# Patient Record
Sex: Male | Born: 1955 | Race: White | Hispanic: No | Marital: Married | Smoking: Never smoker
Health system: Southern US, Community
[De-identification: ages and names within clinical notes are randomized; demographics above are authoritative.]

## PROBLEM LIST (undated history)

## (undated) DIAGNOSIS — C801 Malignant (primary) neoplasm, unspecified: Secondary | ICD-10-CM

## (undated) DIAGNOSIS — R12 Heartburn: Secondary | ICD-10-CM

## (undated) DIAGNOSIS — I10 Essential (primary) hypertension: Secondary | ICD-10-CM

## (undated) HISTORY — PX: COLONOSCOPY: SHX174

## (undated) MED FILL — Dexamethasone Sodium Phosphate Inj 100 MG/10ML: INTRAMUSCULAR | Qty: 1 | Status: AC

---
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
## 2003-04-28 HISTORY — PX: KNEE ARTHROSCOPY: SUR90

## 2004-07-24 ENCOUNTER — Ambulatory Visit: Payer: Self-pay | Admitting: Orthopaedic Surgery

## 2004-10-13 ENCOUNTER — Ambulatory Visit: Payer: Self-pay | Admitting: Orthopaedic Surgery

## 2010-07-25 ENCOUNTER — Ambulatory Visit: Payer: Self-pay

## 2013-09-26 DIAGNOSIS — I10 Essential (primary) hypertension: Secondary | ICD-10-CM | POA: Diagnosis present

## 2017-01-13 ENCOUNTER — Other Ambulatory Visit: Payer: Self-pay | Admitting: Internal Medicine

## 2017-01-13 DIAGNOSIS — E782 Mixed hyperlipidemia: Secondary | ICD-10-CM

## 2017-01-20 ENCOUNTER — Ambulatory Visit
Admission: RE | Admit: 2017-01-20 | Discharge: 2017-01-20 | Disposition: A | Discharge status: Home / Self Care | Payer: Self-pay | Source: Ambulatory Visit | Attending: Internal Medicine | Admitting: Internal Medicine

## 2017-01-20 DIAGNOSIS — E782 Mixed hyperlipidemia: Secondary | ICD-10-CM | POA: Insufficient documentation

## 2017-01-20 DIAGNOSIS — E041 Nontoxic single thyroid nodule: Secondary | ICD-10-CM | POA: Insufficient documentation

## 2017-01-20 DIAGNOSIS — I6523 Occlusion and stenosis of bilateral carotid arteries: Secondary | ICD-10-CM | POA: Insufficient documentation

## 2019-12-01 ENCOUNTER — Other Ambulatory Visit: Payer: Self-pay | Admitting: Internal Medicine

## 2019-12-01 DIAGNOSIS — G8929 Other chronic pain: Secondary | ICD-10-CM

## 2019-12-15 ENCOUNTER — Other Ambulatory Visit: Payer: Self-pay

## 2019-12-15 ENCOUNTER — Ambulatory Visit
Admission: RE | Admit: 2019-12-15 | Discharge: 2019-12-15 | Disposition: A | Discharge status: Home / Self Care | Payer: Medicare Other | Source: Ambulatory Visit | Attending: Internal Medicine | Admitting: Internal Medicine

## 2019-12-15 DIAGNOSIS — G8929 Other chronic pain: Secondary | ICD-10-CM | POA: Insufficient documentation

## 2019-12-15 DIAGNOSIS — R109 Unspecified abdominal pain: Secondary | ICD-10-CM | POA: Diagnosis present

## 2019-12-15 HISTORY — DX: Essential (primary) hypertension: I10

## 2019-12-15 MED ORDER — IOHEXOL 300 MG/ML  SOLN
100.0000 mL | Freq: Once | INTRAMUSCULAR | Status: AC | PRN
  Administered 2019-12-15: 100 mL via INTRAVENOUS

## 2022-02-28 IMAGING — CT CT ABD-PEL WO/W CM
2 of 9 series · 12 of 46 positions shown, 19 images · IV contrast (omnipaque)
Comparison: None.

CLINICAL DATA: Right flank pain for 6 weeks.

EXAM:
CT ABDOMEN AND PELVIS WITHOUT AND WITH CONTRAST
TECHNIQUE: Multidetector CT imaging of the abdomen and pelvis was performed
following the standard protocol before and following the bolus
administration of intravenous contrast.
CONTRAST:  100mL OMNIPAQUE IOHEXOL 300 MG/ML  SOLN

[Series 2: abd pelvis pre 5.00 · axial · non-contrast · 0.92mm/px · z∈[-1495,-1135]mm · 9 of 91 slices shown, 15 images]
[im 10/91  soft-tissue]
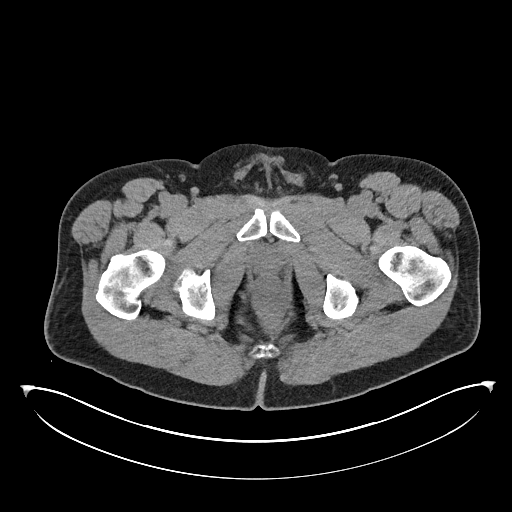
[im 10/91  bone]
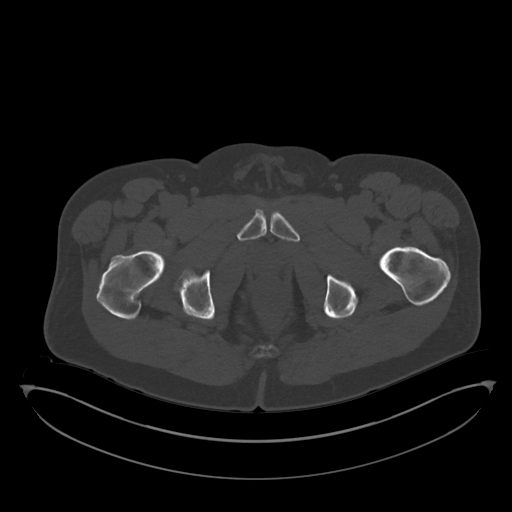
[im 19/91  soft-tissue]
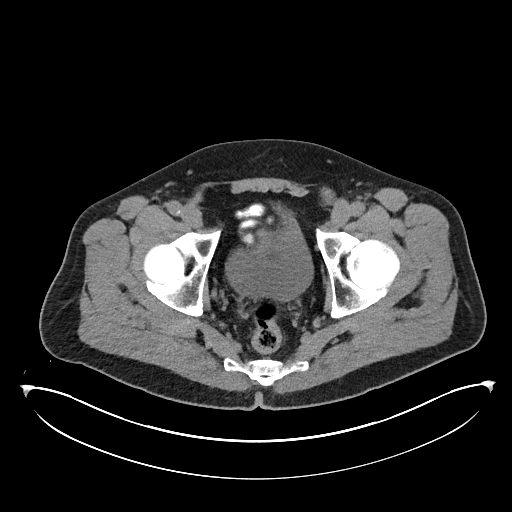
[im 28/91  soft-tissue]
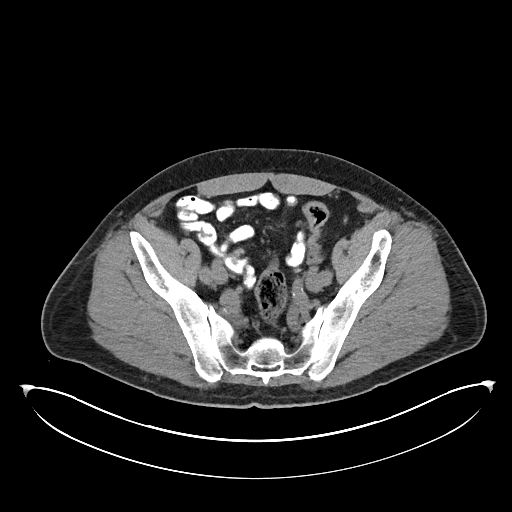
[im 37/91  soft-tissue]
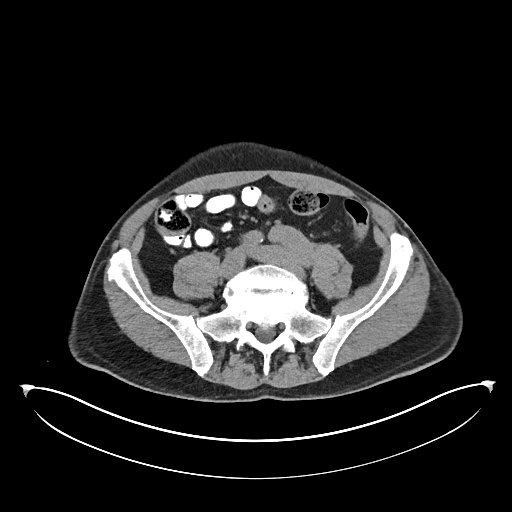
[im 46/91  soft-tissue]
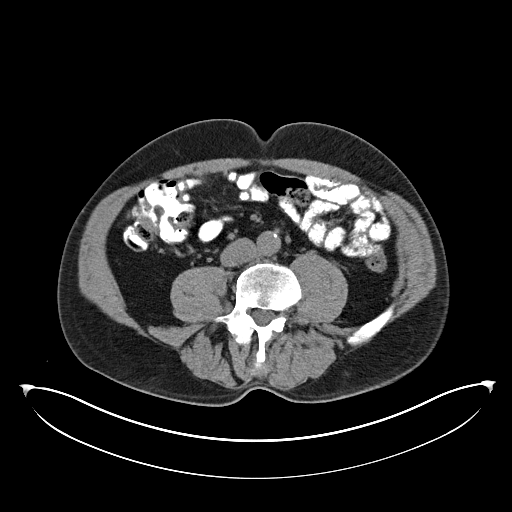
[im 55/91  soft-tissue]
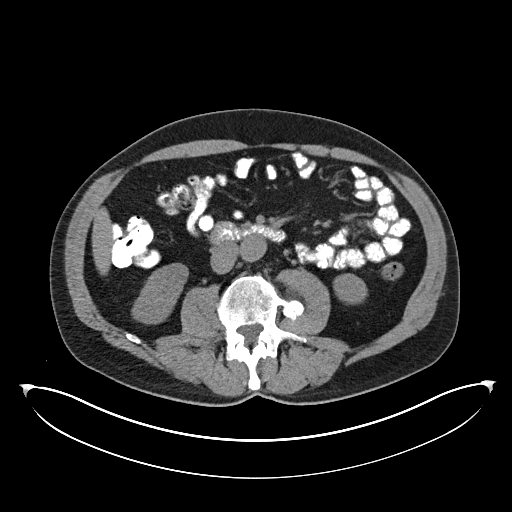
[im 55/91  lung]
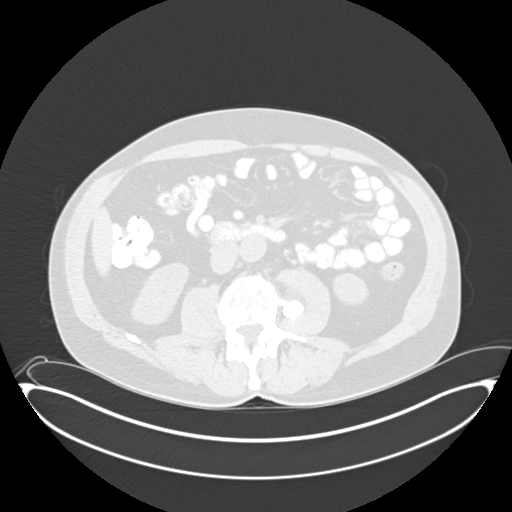
[im 64/91  soft-tissue]
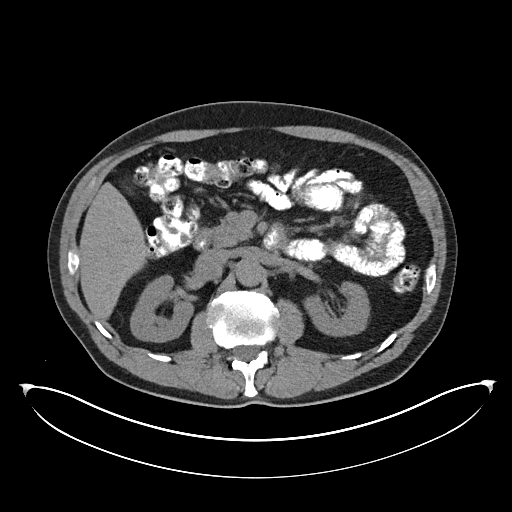
[im 64/91  lung]
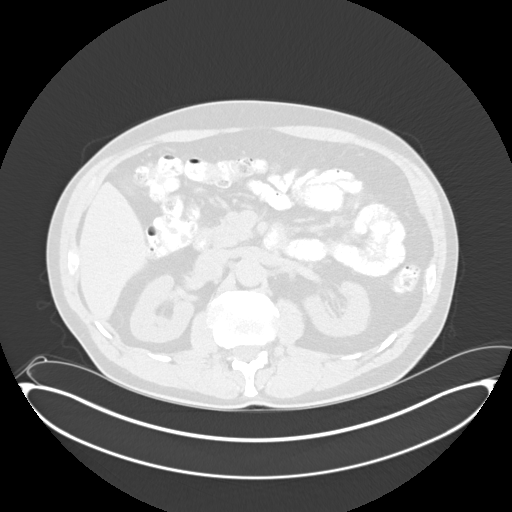
[im 73/91  soft-tissue]
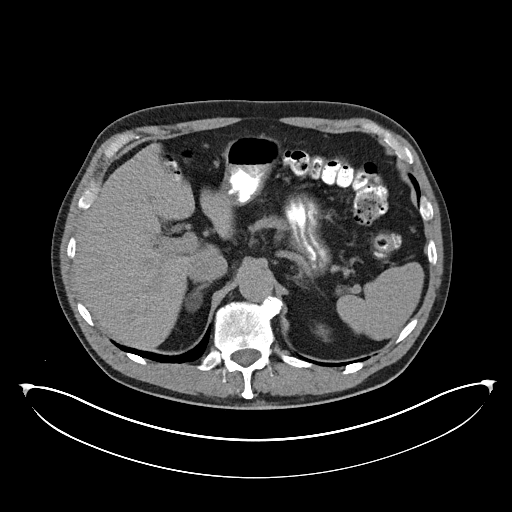
[im 73/91  lung]
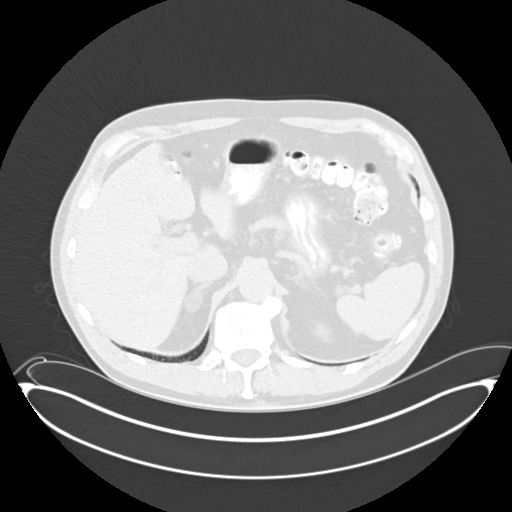
[im 82/91  soft-tissue]
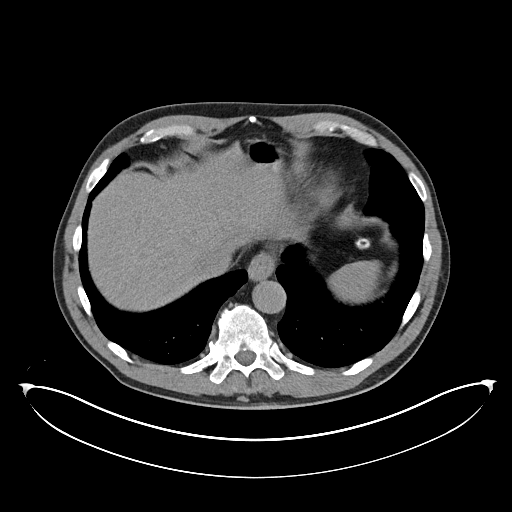
[im 82/91  lung]
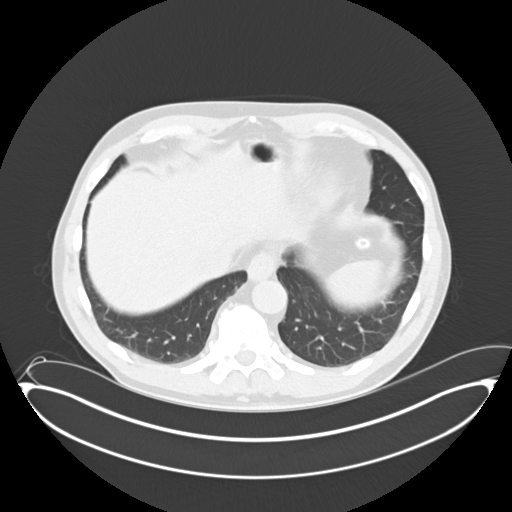
[im 82/91  bone]
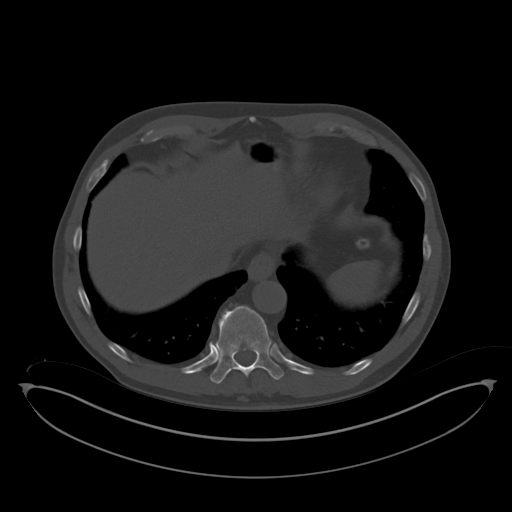

[Series 4: abd pelvis pre 2.00 cor · coronal · non-contrast · 0.82mm/px · 3 of 144 slices shown, 4 images]
[im 36/144  soft-tissue]
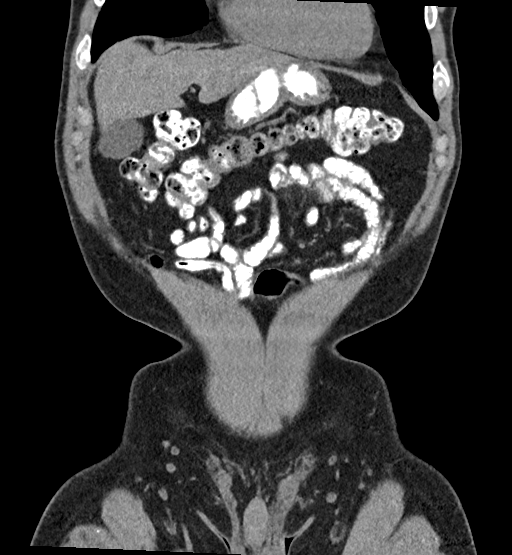
[im 72/144  soft-tissue]
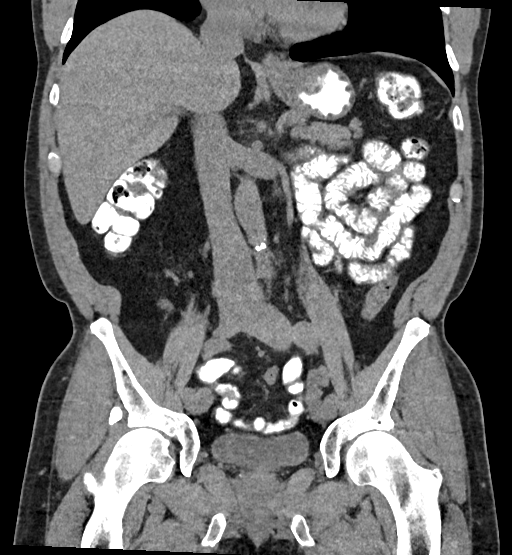
[im 72/144  bone]
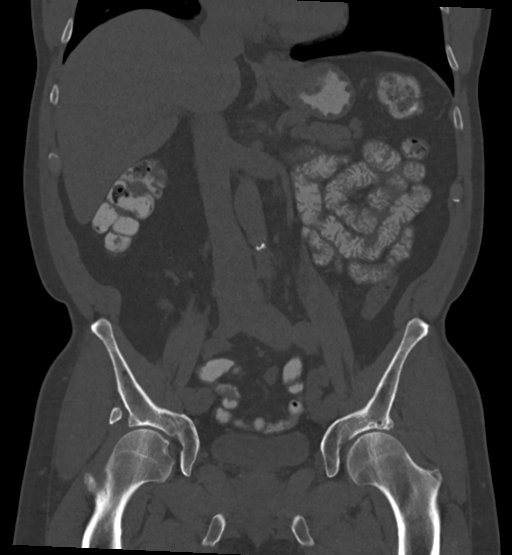
[im 108/144  soft-tissue]
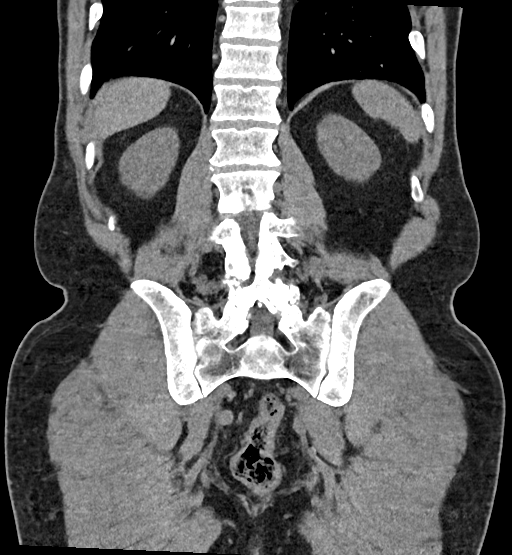

[12 of 46 positions shown; findings below may reference images not displayed]

FINDINGS: Lower chest: Clear lung bases. Borderline cardiomegaly. Tiny hiatal
hernia.

Hepatobiliary: Normal liver. Normal gallbladder, without biliary
ductal dilatation.

Pancreas: Normal, without mass or ductal dilatation.

Spleen: Normal in size, without focal abnormality.

Adrenals/Urinary Tract: Normal left adrenal gland. Right adrenal
nodule of 1.6 cm is low-density, consistent with an adenoma on [DATE].

No renal calculi or hydronephrosis. No hydroureter or ureteric
calculi. No bladder calculi.

No renal mass on post-contrast imaging. Normal post-contrast
appearance of the urinary bladder.

Stomach/Bowel: Normal remainder of the stomach. Normal colon,
appendix, and terminal ileum. Normal small bowel.

Vascular/Lymphatic: Aortic atherosclerosis. No abdominopelvic
adenopathy.

Reproductive: Normal prostate.

Other: No significant free fluid.

Musculoskeletal: Scattered pelvic sclerotic lesions are likely bone
islands. Bilateral L5 pars defects with grade 1 L5-S1
anterolisthesis. Lumbosacral spondylosis. Minimal convex left lumbar
spine curvature.
IMPRESSION: 1.  No acute process or explanation for right-sided pain.
2. Right adrenal adenoma.
3.  Aortic Atherosclerosis ([id number]).

## 2022-04-14 ENCOUNTER — Other Ambulatory Visit: Payer: Self-pay | Admitting: Internal Medicine

## 2022-04-14 DIAGNOSIS — J189 Pneumonia, unspecified organism: Secondary | ICD-10-CM

## 2022-04-27 DIAGNOSIS — R918 Other nonspecific abnormal finding of lung field: Secondary | ICD-10-CM

## 2022-04-27 DIAGNOSIS — J910 Malignant pleural effusion: Secondary | ICD-10-CM

## 2022-04-27 HISTORY — DX: Malignant pleural effusion: J91.0

## 2022-04-27 HISTORY — DX: Other nonspecific abnormal finding of lung field: R91.8

## 2022-04-30 ENCOUNTER — Ambulatory Visit
Admission: RE | Admit: 2022-04-30 | Discharge: 2022-04-30 | Disposition: A | Discharge status: Home / Self Care | Payer: Medicare Other | Source: Ambulatory Visit | Attending: Internal Medicine | Admitting: Internal Medicine

## 2022-04-30 DIAGNOSIS — J189 Pneumonia, unspecified organism: Secondary | ICD-10-CM | POA: Diagnosis not present

## 2022-04-30 MED ORDER — IOHEXOL 300 MG/ML  SOLN
75.0000 mL | Freq: Once | INTRAMUSCULAR | Status: AC | PRN
  Administered 2022-04-30: 75 mL via INTRAVENOUS

## 2022-05-04 ENCOUNTER — Other Ambulatory Visit: Payer: Self-pay | Admitting: Pulmonary Disease

## 2022-05-04 DIAGNOSIS — J90 Pleural effusion, not elsewhere classified: Secondary | ICD-10-CM

## 2022-05-05 ENCOUNTER — Other Ambulatory Visit: Payer: Self-pay | Admitting: Internal Medicine

## 2022-05-05 ENCOUNTER — Ambulatory Visit
Admission: RE | Admit: 2022-05-05 | Discharge: 2022-05-05 | Disposition: A | Discharge status: Home / Self Care | Payer: Medicare Other | Source: Ambulatory Visit | Attending: Pulmonary Disease | Admitting: Pulmonary Disease

## 2022-05-05 ENCOUNTER — Ambulatory Visit
Admission: RE | Admit: 2022-05-05 | Discharge: 2022-05-05 | Disposition: A | Discharge status: Home / Self Care | Payer: Medicare Other | Source: Ambulatory Visit | Attending: Internal Medicine | Admitting: Internal Medicine

## 2022-05-05 DIAGNOSIS — J90 Pleural effusion, not elsewhere classified: Secondary | ICD-10-CM

## 2022-05-05 LAB — LACTATE DEHYDROGENASE, PLEURAL OR PERITONEAL FLUID: LD, Fluid: 239 U/L — ABNORMAL HIGH (ref 3–23)

## 2022-05-05 LAB — BODY FLUID CELL COUNT WITH DIFFERENTIAL
Eos, Fluid: 2 %
Lymphs, Fluid: 77 %
Monocyte-Macrophage-Serous Fluid: 19 %
Neutrophil Count, Fluid: 2 %
Total Nucleated Cell Count, Fluid: 2284 cu mm

## 2022-05-05 LAB — GLUCOSE, PLEURAL OR PERITONEAL FLUID: Glucose, Fluid: 80 mg/dL

## 2022-05-05 MED ORDER — LIDOCAINE HCL (PF) 1 % IJ SOLN
10.0000 mL | Freq: Once | INTRAMUSCULAR | Status: AC
  Administered 2022-05-05: 10 mL via INTRADERMAL
  Filled 2022-05-05: qty 10

## 2022-05-05 NOTE — Procedures (Signed)
PROCEDURE SUMMARY:  Successful US guided diagnostic and therapeutic right thoracentesis. Yielded 1.55 L of clear, amber fluid. Pt tolerated procedure well. No immediate complications.  Specimen was sent for labs. CXR ordered.  EBL < 1 mL  Tyson Alias, AGNP 05/05/2022 9:10 AM

## 2022-05-05 NOTE — Discharge Instructions (Signed)
Discharge instructions reviewed with patient.

## 2022-05-07 LAB — CYTOLOGY - NON PAP

## 2022-05-12 ENCOUNTER — Encounter: Payer: Self-pay | Admitting: *Deleted

## 2022-05-12 DIAGNOSIS — J910 Malignant pleural effusion: Secondary | ICD-10-CM

## 2022-05-12 DIAGNOSIS — R918 Other nonspecific abnormal finding of lung field: Secondary | ICD-10-CM

## 2022-05-12 NOTE — Addendum Note (Signed)
Addended by: Glory Buff on: 05/12/2022 11:42 AM   Modules accepted: Orders

## 2022-05-12 NOTE — Progress Notes (Signed)
Referral received from Dr. Karna Christmas for suspicious malignancy in pleural fluid. Pt preferred to come in at next available new pt appt. Pt scheduled for 05/14/22 at 930am with Dr. Cathie Hoops. Pt and wife confirmed appt. Reviewed visitor and masking policy. Pt and wife did not have any questions during call. Instructed to call with any questions or needs prior to appt. Pt and wife verbalized understanding. Nothing further needed at this time.

## 2022-05-13 ENCOUNTER — Ambulatory Visit
Admission: RE | Admit: 2022-05-13 | Discharge: 2022-05-13 | Disposition: A | Discharge status: Home / Self Care | Payer: Medicare Other | Source: Ambulatory Visit | Attending: Oncology | Admitting: Oncology

## 2022-05-13 ENCOUNTER — Telehealth: Payer: Self-pay

## 2022-05-13 VITALS — Ht 70.0 in | Wt 199.0 lb

## 2022-05-13 DIAGNOSIS — R918 Other nonspecific abnormal finding of lung field: Secondary | ICD-10-CM | POA: Diagnosis not present

## 2022-05-13 DIAGNOSIS — J910 Malignant pleural effusion: Secondary | ICD-10-CM | POA: Diagnosis present

## 2022-05-13 DIAGNOSIS — M47816 Spondylosis without myelopathy or radiculopathy, lumbar region: Secondary | ICD-10-CM | POA: Insufficient documentation

## 2022-05-13 DIAGNOSIS — M4317 Spondylolisthesis, lumbosacral region: Secondary | ICD-10-CM | POA: Diagnosis not present

## 2022-05-13 DIAGNOSIS — I700 Atherosclerosis of aorta: Secondary | ICD-10-CM | POA: Diagnosis not present

## 2022-05-13 DIAGNOSIS — D3501 Benign neoplasm of right adrenal gland: Secondary | ICD-10-CM | POA: Insufficient documentation

## 2022-05-13 LAB — GLUCOSE, CAPILLARY: Glucose-Capillary: 96 mg/dL (ref 70–99)

## 2022-05-13 MED ORDER — FLUDEOXYGLUCOSE F - 18 (FDG) INJECTION
11.1200 | Freq: Once | INTRAVENOUS | Status: AC | PRN
  Administered 2022-05-13: 11.12 via INTRAVENOUS

## 2022-05-13 NOTE — Telephone Encounter (Signed)
Introductory phone call made to patient prior to his first visit with Dr. Cathie Hoops on 05/14/22. Patient did not answer and I was sent to his voicemail. I introduced myself on voicemail and told patient that I was calling to answer any questions that he may have about appointment and make sure he knows where to come. I told patient that he could call back if he would like but if not then we will see him tomorrow at his appointment.

## 2022-05-14 ENCOUNTER — Inpatient Hospital Stay: Payer: Medicare Other

## 2022-05-14 ENCOUNTER — Encounter: Payer: Self-pay | Admitting: *Deleted

## 2022-05-14 ENCOUNTER — Inpatient Hospital Stay: Payer: Medicare Other | Attending: Oncology | Admitting: Oncology

## 2022-05-14 ENCOUNTER — Encounter: Payer: Self-pay | Admitting: Oncology

## 2022-05-14 VITALS — BP 157/97 | HR 65 | Temp 97.0°F | Wt 200.3 lb

## 2022-05-14 DIAGNOSIS — R911 Solitary pulmonary nodule: Secondary | ICD-10-CM | POA: Diagnosis present

## 2022-05-14 DIAGNOSIS — C3490 Malignant neoplasm of unspecified part of unspecified bronchus or lung: Secondary | ICD-10-CM | POA: Insufficient documentation

## 2022-05-14 DIAGNOSIS — Z79899 Other long term (current) drug therapy: Secondary | ICD-10-CM | POA: Diagnosis not present

## 2022-05-14 DIAGNOSIS — J90 Pleural effusion, not elsewhere classified: Secondary | ICD-10-CM | POA: Insufficient documentation

## 2022-05-14 DIAGNOSIS — R918 Other nonspecific abnormal finding of lung field: Secondary | ICD-10-CM | POA: Insufficient documentation

## 2022-05-14 DIAGNOSIS — I10 Essential (primary) hypertension: Secondary | ICD-10-CM | POA: Diagnosis not present

## 2022-05-14 DIAGNOSIS — J910 Malignant pleural effusion: Secondary | ICD-10-CM | POA: Insufficient documentation

## 2022-05-14 LAB — COMPREHENSIVE METABOLIC PANEL
ALT: 15 U/L (ref 0–44)
AST: 19 U/L (ref 15–41)
Albumin: 3.6 g/dL (ref 3.5–5.0)
Alkaline Phosphatase: 81 U/L (ref 38–126)
Anion gap: 8 (ref 5–15)
BUN: 15 mg/dL (ref 8–23)
CO2: 26 mmol/L (ref 22–32)
Calcium: 8.6 mg/dL — ABNORMAL LOW (ref 8.9–10.3)
Chloride: 101 mmol/L (ref 98–111)
Creatinine, Ser: 1.06 mg/dL (ref 0.61–1.24)
GFR, Estimated: >60 mL/min (ref >60)
Glucose, Bld: 102 mg/dL — ABNORMAL HIGH (ref 70–99)
Potassium: 5.1 mmol/L (ref 3.5–5.1)
Sodium: 135 mmol/L (ref 135–145)
Total Bilirubin: 0.8 mg/dL (ref 0.3–1.2)
Total Protein: 7.6 g/dL (ref 6.5–8.1)

## 2022-05-14 LAB — CBC WITH DIFFERENTIAL/PLATELET
Abs Immature Granulocytes: 0.06 10*3/uL (ref 0.00–0.07)
Basophils Absolute: 0.1 10*3/uL (ref 0.0–0.1)
Basophils Relative: 1 %
Eosinophils Absolute: 0.4 10*3/uL (ref 0.0–0.5)
Eosinophils Relative: 6 %
HCT: 45.3 % (ref 39.0–52.0)
Hemoglobin: 15.2 g/dL (ref 13.0–17.0)
Immature Granulocytes: 1 %
Lymphocytes Relative: 15 %
Lymphs Abs: 1.1 10*3/uL (ref 0.7–4.0)
MCH: 31 pg (ref 26.0–34.0)
MCHC: 33.6 g/dL (ref 30.0–36.0)
MCV: 92.4 fL (ref 80.0–100.0)
Monocytes Absolute: 0.8 10*3/uL (ref 0.1–1.0)
Monocytes Relative: 11 %
Neutro Abs: 4.8 10*3/uL (ref 1.7–7.7)
Neutrophils Relative %: 66 %
Platelets: 295 10*3/uL (ref 150–400)
RBC: 4.9 MIL/uL (ref 4.22–5.81)
RDW: 12.4 % (ref 11.5–15.5)
WBC: 7.4 10*3/uL (ref 4.0–10.5)
nRBC: 0 % (ref 0.0–0.2)

## 2022-05-14 LAB — MISC LABCORP TEST (SEND OUT): Labcorp test code: 9985

## 2022-05-14 NOTE — Progress Notes (Signed)
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

## 2022-05-14 NOTE — Progress Notes (Signed)
Met with patient during initial consult with Dr. Cathie Hoops to discuss suspicious findings in pleural fluid. All questions answered during visit. Pt requesting educational materials regarding lung cancer and possible treatment options. Informed pt that will gather materials and schedule his appts while he is getting labwork drawn. Uanble to find pt after lab draw to give materials and appt info. Called pt and left message regarding brain MRI appt and that materials he requested will be mailed to him. Instructed pt to call back if has any questions or needs.

## 2022-05-14 NOTE — Assessment & Plan Note (Addendum)
Imaging results and pleural effusion cytology were reviewed and discussed with patient.  Clinical picture is high suspicious for malignancy, ddx includes primary lung cancer, lymphoma etc.  Given that cytology is not definitive, I recommend further tissue biopsy via bronchoscopy. -We discussed with pulmonology Dr. Karna Christmas and he will arrange. Recommend MRI brain w wo contrast to complete staging.  Plan to send NGS molecular studies. Check CBC and CMP

## 2022-05-14 NOTE — Assessment & Plan Note (Signed)
Likely malignant pleural effusion.  Status post diagnostic thoracentesis.  Cytology not definitive. He is currently not symptomatic.  No benefit for therapeutic thoracentesis.  Awaiting above workup.

## 2022-05-14 NOTE — Progress Notes (Signed)
Hematology/Oncology Consult Note [phone] [phone]     REFERRING PROVIDER: Danella Penton, MD   Patient Care Team: Danella Penton, MD as PCP - General (Internal Medicine) Glory Buff, RN as Oncology Nurse Navigator  CHIEF COMPLAINTS/PURPOSE OF CONSULTATION:  Lung mass, malignant pleural effusion.    ASSESSMENT & PLAN:   Lung mass Imaging results and pleural effusion cytology were reviewed and discussed with patient.  Clinical picture is high suspicious for malignancy, ddx includes primary lung cancer, lymphoma etc.  Given that cytology is not definitive, I recommend further tissue biopsy via bronchoscopy. -We discussed with pulmonology Dr. Karna Christmas and he will arrange. Recommend MRI brain w wo contrast to complete staging.  Plan to send NGS molecular studies. 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  Status post diagnostic thoracentesis.  Cytology not definitive. He is currently not symptomatic.  No benefit for therapeutic thoracentesis.  Awaiting above workup.   Orders Placed This Encounter  Procedures   MR Brain W Wo Contrast    Standing Status:   Future    Standing Expiration Date:   05/14/2023    Order Specific Question:   If indicated for the ordered procedure, I authorize the administration of contrast media per Radiology protocol    Answer:   Yes    Order Specific Question:   What is the patient's sedation requirement?    Answer:   No Sedation    Order Specific Question:   Does the patient have a pacemaker or implanted devices?    Answer:   No    Order Specific Question:   Use SRS Protocol?    Answer:   No    Order Specific Question:   Preferred imaging location?    Answer:   [facility] (table limit - 550lbs)   CBC with Differential/Platelet    Standing Status:   Future    Number of Occurrences:   1    Standing Expiration Date:   05/15/2023   Comprehensive metabolic panel     Standing Status:   Future    Number of Occurrences:   1    Standing Expiration Date:   05/14/2023   Follow-up TBD All questions were answered. The patient knows to call the clinic with any problems, questions or concerns.  Rickard Patience, MD, PhD [facility] Hematology Oncology 05/14/2022   HISTORY OF PRESENTING ILLNESS:  Kurt Ewing 67 y.o. male presents to establish care for lung mass and malignant pleural effusion.  Patient is a lifelong never smoker.  He has has a persistent cough since summer of 2023.  02/25/22 patient was seen by PCP and was diagnosed with bronchitis, was treated with Azithromycin.  Cough did not improve. He took levaquin for 10 days without improvement either.  04/30/22 CT chest w contrast showed  1. Perihilar right upper lobe masslike opacity measuring 3.4 x 5.6 x 3.8 cm, This abuts the major fissure 2. Moderate-sized right pleural effusion which is partially loculated tracks anteriorly the lower hemithorax. Possible occasional pleural enhancement and thickening posteriorly. 3. Rounded masslike opacity in the dependent right lower lobe measuring 5.8 x 4.1 cm 4. Additional bilateral pulmonary nodules, largest measuring 7 mm in the right upper lobe. 5. Prominent mediastinal and right hilar lymph nodes.  6   Low-density 2.4 cm right adrenal nodule. This is unchanged from 12/15/2019 abdominal CT in typical of adenoma  05/05/2022 status post ultrasound-guided right thoracentesis.  Cytology showed suspicious for malignancy, background  lymphocyte predominant effusion.  05/13/2022 PET scan initial staging showed 1. 5.9 by 3.5 cm right hilar mass with maximum SUV 5.7, compatible with malignancy. The epicenter of this mass appears to be in the right upper lobe. Faintly increased activity in the right hilar lymph node which could be involved. 2. Large right pleural effusion with pleural thickening and subtle accentuated activity along the pleural surface without a well-defined  masslike pleural lesion. Correlate with recent thoracocentesis results. 3. Low-grade activity in the collapsed right lower lobe, maximum SUV 3.2, probably inflammatory. 4. No findings of malignancy outside of the chest. 5. 2.4 cm right adrenal adenoma does not require follow up. 6. 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 Lumbar spondylosis and degenerative disc disease. 7. Aortic atherosclerosis.  Patient was referred to hematology oncology for further evaluation.  Patient is accompanied by wife today.  He denies being shortness of breath.  He remains very active.  He denies unintentional weight loss, night sweats, fever.    MEDICAL HISTORY:  Past Medical History:  Diagnosis Date   Hypertension     SURGICAL HISTORY: History reviewed. No pertinent surgical history.  SOCIAL HISTORY: Social History   Socioeconomic History   Marital status: Married    Spouse name: Not on file   Number of children: Not on file   Years of education: Not on file   Highest education level: Not on file  Occupational History   Not on file  Tobacco Use   Smoking status: Never   Smokeless tobacco: Never  Vaping Use   Vaping Use: Never used  Substance and Sexual Activity   Alcohol use: Not on file   Drug use: Not on file   Sexual activity: Not on file  Other Topics Concern   Not on file  Social History Narrative   Not on file   Social Determinants of Health   Financial Resource Strain: Not on file  Food Insecurity: Not on file  Transportation Needs: Not on file  Physical Activity: Not on file  Stress: Not on file  Social Connections: Not on file  Intimate Partner Violence: Not on file    FAMILY HISTORY: Family History  Problem Relation Age of Onset   Hypertension Father    Heart disease Father    Hypertension Brother    COPD Brother     ALLERGIES:  has no allergies on file.  MEDICATIONS:  Current Outpatient Medications  Medication Sig  Dispense Refill   famotidine (PEPCID) 40 MG tablet Take 1 tablet by mouth at bedtime.     ibuprofen (ADVIL) 200 MG tablet Take by mouth.     lisinopril (ZESTRIL) 5 MG tablet Take 5 mg by mouth daily.     meloxicam (MOBIC) 15 MG tablet Take by mouth.     No current facility-administered medications for this visit.    Review of Systems  Constitutional:  Negative for appetite change, chills, fatigue, fever and unexpected weight change.  HENT:   Negative for hearing loss and voice change.   Eyes:  Negative for eye problems and icterus.  Respiratory:  Positive for cough. Negative for chest tightness and shortness of breath.   Cardiovascular:  Negative for chest pain and leg swelling.  Gastrointestinal:  Negative for abdominal distention and abdominal pain.  Endocrine: Negative for hot flashes.  Genitourinary:  Negative for difficulty urinating, dysuria and frequency.   Musculoskeletal:  Negative for arthralgias.  Skin:  Negative for itching and rash.  Neurological:  Negative for light-headedness and numbness.  Hematological:  Negative for adenopathy. Does not bruise/bleed easily.  Psychiatric/Behavioral:  Negative for confusion.      PHYSICAL EXAMINATION: ECOG PERFORMANCE STATUS: 0 - Asymptomatic  Vitals:   05/14/22 0941  BP: (!) 157/97  Pulse: 65  Temp: (!) 97 F (36.1 C)  SpO2: 99%   Filed Weights   05/14/22 0941  Weight: 200 lb 4.8 oz (90.9 kg)    Physical Exam Constitutional:      General: He is not in acute distress.    Appearance: He is not diaphoretic.  HENT:     Head: Normocephalic and atraumatic.     Nose: Nose normal.     Mouth/Throat:     Pharynx: No oropharyngeal exudate.  Eyes:     General: No scleral icterus. Cardiovascular:     Rate and Rhythm: Normal rate.     Heart sounds: No murmur heard. Pulmonary:     Effort: Pulmonary effort is normal. No respiratory distress.     Comments: Decreased right side breath sound.  Abdominal:     General: Bowel  sounds are normal. There is no distension.     Palpations: Abdomen is soft.     Tenderness: There is no abdominal tenderness.  Musculoskeletal:        General: Normal range of motion.     Cervical back: Normal range of motion and neck supple.  Skin:    General: Skin is warm and dry.     Findings: No erythema.  Neurological:     Mental Status: He is alert and oriented to person, place, and time.     Cranial Nerves: No cranial nerve deficit.     Motor: No abnormal muscle tone.     Coordination: Coordination normal.  Psychiatric:        Mood and Affect: Affect normal.      LABORATORY DATA:  I have reviewed the data as listed    Latest Ref Rng & Units 05/14/2022   10:51 AM  CBC  WBC 4.0 - 10.5 K/uL 7.4   Hemoglobin 13.0 - 17.0 g/dL 89.0   Hematocrit 13.8 - 52.0 % 45.3   Platelets 150 - 400 K/uL 295       Latest Ref Rng & Units 05/14/2022   10:51 AM  CMP  Glucose 70 - 99 mg/dL 552   BUN 8 - 23 mg/dL 15   Creatinine 8.94 - 1.24 mg/dL 2.40   Sodium 642 - 942 mmol/L 135   Potassium 3.5 - 5.1 mmol/L 5.1   Chloride 98 - 111 mmol/L 101   CO2 22 - 32 mmol/L 26   Calcium 8.9 - 10.3 mg/dL 8.6   Total Protein 6.5 - 8.1 g/dL 7.6   Total Bilirubin 0.3 - 1.2 mg/dL 0.8   Alkaline Phos 38 - 126 U/L 81   AST 15 - 41 U/L 19   ALT 0 - 44 U/L 15      RADIOGRAPHIC STUDIES: I have personally reviewed the radiological images as listed and agreed with the findings in the report. NM PET Image Initial (PI) Skull Base To Thigh  Result Date: 05/13/2022 CLINICAL DATA:  Subsequent treatment strategy for right upper lobe mass with pleural effusion. EXAM: NUCLEAR MEDICINE PET SKULL BASE TO THIGH TECHNIQUE: 11.1 mCi F-18 FDG was injected intravenously. Full-ring PET imaging was performed from the skull base to thigh after the radiotracer. CT data was obtained and used for attenuation correction and anatomic localization. Fasting blood  glucose: 96 mg/dl COMPARISON:  CT chest 04/30/2022 FINDINGS:  Mediastinal blood pool activity: SUV max 1.5 Liver activity: SUV max NA NECK: Anterior tongue activity, no CT abnormality, considered physiologic. Incidental CT findings: None CHEST: Right hilar mass extends along the fissures but its epicenter seems to be in the right upper lobe, the lesion measures 5.9 by 3.5 cm on image 89 series 2 with maximum SUV 5.7, compatible with malignancy. Large right pleural effusion with right pleural thickening and subtle accentuated activity along the pleural surface without a well-defined masslike pleural lesion. Pleural fluid activity about 0.8, correlate with recent thoracocentesis results in assessing for pleural malignancy. There is some ill-defined low-grade activity in the collapsed right lower lobe, maximum SUV 3.2. Given the ill definition I favor this is probably being inflammatory rather than tumor, but the right lower lobe is difficult to characterize due to the complete atelectasis. Right hilar node measuring 0.7 cm in short axis on image 91 series 2 has a maximum SUV of 2.5, although low this is greater than background blood pool activity. 1.4 cm hypodense lesion of the right thyroid lobe is not substantially metabolic. Not clinically significant; no follow-up imaging recommended (ref: J Am Coll Radiol. 2015 Feb;12(2): 143-50). Incidental CT findings: Coronary, aortic arch, and branch vessel atherosclerotic vascular disease. Mild cardiomegaly. ABDOMEN/PELVIS: No significant abnormal hypermetabolic activity in this region. Incidental CT findings: 2.4 by 1.9 cm right adrenal adenoma. This does not require follow up. Atherosclerosis is present, including aortoiliac atherosclerotic disease. SKELETON: No significant abnormal hypermetabolic activity in this region. 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 Lumbar spondylosis and degenerative disc disease. IMPRESSION: 1. 5.9 by 3.5 cm right hilar mass with maximum SUV  5.7, compatible with malignancy. The epicenter of this mass appears to be in the right upper lobe. Faintly increased activity in the right hilar lymph node which could be involved. 2. Large right pleural effusion with pleural thickening and subtle accentuated activity along the pleural surface without a well-defined masslike pleural lesion. Correlate with recent thoracocentesis results. 3. Low-grade activity in the collapsed right lower lobe, maximum SUV 3.2, probably inflammatory. 4. No findings of malignancy outside of the chest. 5. 2.4 cm right adrenal adenoma does not require follow up. 6. 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 Lumbar spondylosis and degenerative disc disease. 7. Aortic atherosclerosis. Aortic Atherosclerosis (ICD10-I70.0). Electronically Signed   By: Van Clines M.D.   On: 05/13/2022 15:14   DG Chest Port 1 View  Result Date: 05/05/2022 CLINICAL DATA:  67 year old male with right pleural effusion and suspected underlying lung mass by CT. Status post ultrasound-guided right thoracentesis this morning. EXAM: PORTABLE CHEST 1 VIEW COMPARISON:  Chest CT 04/30/2022. FINDINGS: Portable AP upright view at 0913 hours. Reduced volume of right pleural fluid with on going rounded and masslike roughly 5 cm mid lung opacity corresponding to the CT abnormality. No pneumothorax. Ongoing volume loss in the right hemithorax. Stable mediastinal contours. Left lung and trachea appear negative. Stable visualized osseous structures. IMPRESSION: 1. Decreased right pleural effusion following thoracentesis with no pneumothorax. 2. Continued 5 cm right mid lung mass-like and suspicious lung opacity as seen by CT. Electronically Signed   By: Genevie Ann M.D.   On: 05/05/2022 09:27   US THORACENTESIS ASP PLEURAL SPACE W/IMG GUIDE  Result Date: 05/05/2022 INDICATION: Patient recently diagnosed with pneumonia had incidental finding on CT of right upper and  lower lobe mass,  multiple bilateral pulmonary nodules and moderate right-sided pleural effusion. Patient was referred to IR for diagnostic and therapeutic right thoracentesis. EXAM: ULTRASOUND GUIDED DIAGNOSTIC AND THERAPEUTIC RIGHT THORACENTESIS MEDICATIONS: 10 mL 1 % lidocaine COMPLICATIONS: None immediate. PROCEDURE: An ultrasound guided thoracentesis was thoroughly discussed with the patient and questions answered. The benefits, risks, alternatives and complications were also discussed. The patient understands and wishes to proceed with the procedure. Written consent was obtained. Ultrasound was performed to localize and mark an adequate pocket of fluid in the right chest. The area was then prepped and draped in the normal sterile fashion. 1% Lidocaine was used for local anesthesia. Under ultrasound guidance a 6 Fr Safe-T-Centesis catheter was introduced. Thoracentesis was performed. The catheter was removed and a dressing applied. FINDINGS: A total of approximately 1.55 L of clear, amber fluid was removed. Samples were sent to the laboratory as requested by the clinical team. IMPRESSION: Successful ultrasound guided RIGHT thoracentesis yielding 1.55 L of pleural fluid. Read by: Alex Gardener, AGNP-BC Electronically Signed   By: Roanna Banning M.D.   On: 05/05/2022 09:26   CT CHEST W CONTRAST  Result Date: 04/30/2022 CLINICAL DATA:  Pneumonia of right middle lobe due to infectious organism. EXAM: CT CHEST WITH CONTRAST TECHNIQUE: Multidetector CT imaging of the chest was performed during intravenous contrast administration. RADIATION DOSE REDUCTION: This exam was performed according to the departmental dose-optimization program which includes automated exposure control, adjustment of the mA and/or kV according to patient size and/or use of iterative reconstruction technique. CONTRAST:  45mL OMNIPAQUE IOHEXOL 300 MG/ML  SOLN COMPARISON:  Report from radiograph 04/06/2022, images not available. FINDINGS: Cardiovascular: Diffuse  calcified noncalcified atheromatous plaque throughout the thoracic aorta. No aneurysm. The heart is upper normal in size. No pericardial effusion. There are coronary artery calcifications. No central pulmonary embolus on this exam not tailored to pulmonary artery assessment. Mediastinum/Nodes: 9 mm lower anterior paratracheal node series 2, image 78. 12 mm right hilar node series 2, image 84. Additional prominent but not pathologically enlarged low right hilar node measures 7 mm series 2, image 99. There are small right epicardial nodes. There is no left hilar adenopathy. 14 mm hypodense right thyroid nodule. Not clinically significant; no follow-up imaging recommended (ref: J Am Coll Radiol. 2015 Feb;12(2): 143-50).Minimal hiatal hernia. Lungs/Pleura: Perihilar right upper lobe masslike opacity measures 3.4 x 5.6 x 3.8 cm, for example series 3, image 79. This abuts the major fissure. Margins are partially obscured by adjacent pleural fluid, however non obscured margins are spiculated and microlobulated. Moderate-sized right pleural effusion which is partially loculated tracks anteriorly the lower hemithorax. There may be areas of occasional pleural enhancement and thickening posteriorly. Within the dependent right lower lobe is an additional rounded masslike opacity, 5.8 x 4.1 cm, measured on series 2, image 117 there is this represents round atelectasis due to overlying pleural effusion. 7 mm right upper lobe pulmonary nodule series 3, image 46. Bandlike opacities in the right middle lobe. Few small nodules in the left lung includes 4 mm in the anterior left apex series 3, image 34, 2-3 mm left upper lobe series 3, image 56, 4 mm paramediastinal medial left upper lobe series 3, image 72, 5 mm central left upper lobe series 3, image 82, 4 mGy superior segment left upper lobe series 3, image 75. No left pleural effusion. No endobronchial lesion. Upper Abdomen: 2.4 cm right adrenal nodule with Hounsfield units of 35.  This nodule is unchanged from 12/15/2019 abdominal CT.  No left adrenal nodule. No acute upper abdominal findings. Musculoskeletal: No blastic or destructive lytic bone lesions. Mild thoracic spondylosis. Advanced right glenohumeral degenerative change with subchondral cysts. IMPRESSION: 1. Perihilar right upper lobe masslike opacity measuring 3.4 x 5.6 x 3.8 cm, suspicious for primary bronchogenic malignancy. This abuts the major fissure. Differential consideration includes masslike pneumonia. Recommend in neoplasm workup. 2. Moderate-sized right pleural effusion which is partially loculated tracks anteriorly the lower hemithorax. There may be areas of occasional pleural enhancement and thickening posteriorly. 3. Rounded masslike opacity in the dependent right lower lobe measuring 5.8 x 4.1 cm. This is nonspecific and may represent round atelectasis due to overlying pleural effusion, although the possibility of additional site of neoplasm or infection is also raise. 4. Additional bilateral pulmonary nodules, largest measuring 7 mm in the right upper lobe. 5. Prominent mediastinal and right hilar lymph nodes. 6. Low-density 2.4 cm right adrenal nodule. This is unchanged from 12/15/2019 abdominal CT in typical of adenoma. Aortic Atherosclerosis (ICD10-I70.0). These results will be called to the ordering clinician or representative by the Radiologist Assistant, and communication documented in the PACS or [employer]. Electronically Signed   By: Narda Rutherford M.D.   On: 04/30/2022 23:00

## 2022-05-15 ENCOUNTER — Ambulatory Visit: Payer: Medicare Other | Admitting: Oncology

## 2022-05-15 ENCOUNTER — Other Ambulatory Visit: Payer: Medicare Other

## 2022-05-20 ENCOUNTER — Other Ambulatory Visit: Payer: Self-pay | Admitting: Pulmonary Disease

## 2022-05-20 DIAGNOSIS — R918 Other nonspecific abnormal finding of lung field: Secondary | ICD-10-CM

## 2022-05-21 ENCOUNTER — Encounter
Admission: RE | Admit: 2022-05-21 | Discharge: 2022-05-21 | Disposition: A | Discharge status: Home / Self Care | Payer: Medicare Other | Source: Ambulatory Visit | Attending: Pulmonary Disease | Admitting: Pulmonary Disease

## 2022-05-21 ENCOUNTER — Ambulatory Visit
Admission: RE | Admit: 2022-05-21 | Discharge: 2022-05-21 | Disposition: A | Discharge status: Home / Self Care | Payer: Medicare Other | Source: Ambulatory Visit | Attending: Oncology | Admitting: Oncology

## 2022-05-21 ENCOUNTER — Other Ambulatory Visit: Payer: Self-pay | Admitting: Pulmonary Disease

## 2022-05-21 DIAGNOSIS — J910 Malignant pleural effusion: Secondary | ICD-10-CM

## 2022-05-21 DIAGNOSIS — R918 Other nonspecific abnormal finding of lung field: Secondary | ICD-10-CM

## 2022-05-21 HISTORY — DX: Heartburn: R12

## 2022-05-21 MED ORDER — GADOBUTROL 1 MMOL/ML IV SOLN
9.0000 mL | Freq: Once | INTRAVENOUS | Status: AC | PRN
  Administered 2022-05-21: 9 mL via INTRAVENOUS

## 2022-05-21 NOTE — Patient Instructions (Addendum)
Your procedure is scheduled on: Friday, February 2 Report to the Registration Desk on the 1st floor of the [facility]. To find out your arrival time, please call [phone] between 1PM - 3PM on: Thursday, February 1 If your arrival time is 6:00 am, do not arrive prior to that time as the [facility] entrance doors do not open until 6:00 am.  REMEMBER: Instructions that are not followed completely may result in serious medical risk, up to and including death; or upon the discretion of your surgeon and anesthesiologist your surgery may need to be rescheduled.  Do not eat or drink after midnight the night before surgery.  No gum chewing, lozengers or hard candies.  TAKE THESE MEDICATIONS THE MORNING OF SURGERY WITH A SIP OF WATER:  Famotidine (Pepcid)  One week prior to surgery: starting January 26 Stop Meloxicam and  Anti-inflammatories (NSAIDS) such as Advil, Aleve, Ibuprofen, Motrin, Naproxen, Naprosyn and Aspirin based products such as Excedrin, Goodys Powder, BC Powder. Stop ANY OVER THE COUNTER supplements until after surgery. You may however, continue to take Tylenol if needed for pain up until the day of surgery.  No Alcohol for 24 hours before or after surgery.  No Smoking including e-cigarettes for 24 hours prior to surgery.  No chewable tobacco products for at least 6 hours prior to surgery.  No nicotine patches on the day of surgery.  Do not use any "recreational" drugs for at least a week prior to your surgery.  Please be advised that the combination of cocaine and anesthesia may have negative outcomes, up to and including death. If you test positive for cocaine, your surgery will be cancelled.  On the morning of surgery brush your teeth with toothpaste and water, you may rinse your mouth with mouthwash if you wish. Do not swallow any toothpaste or mouthwash.  Do not wear jewelry, make-up, hairpins, clips or nail polish.  Do not wear lotions, powders, or perfumes.    Contact lenses, hearing aids and dentures may not be worn into surgery.  Do not bring valuables to the hospital. [facility] is not responsible for any missing/lost belongings or valuables.   Notify your doctor if there is any change in your medical condition (cold, fever, infection).  Wear comfortable clothing (specific to your surgery type) to the hospital.  If you are being discharged the day of surgery, you will not be allowed to drive home. You will need a responsible adult (18 years or older) to drive you home and stay with you that night.   If you are taking public transportation, you will need to have a responsible adult (18 years or older) with you. Please confirm with your physician that it is acceptable to use public transportation.   Please call the [place] Dept. at [phone] if you have any questions about these instructions.  Surgery Visitation Policy:  Patients undergoing a surgery or procedure may have two family members or support persons with them as long as the person is not COVID-19 positive or experiencing its symptoms.

## 2022-05-22 ENCOUNTER — Other Ambulatory Visit: Payer: Self-pay | Admitting: Pulmonary Disease

## 2022-05-22 ENCOUNTER — Ambulatory Visit
Admission: RE | Admit: 2022-05-22 | Discharge: 2022-05-22 | Disposition: A | Discharge status: Home / Self Care | Payer: Medicare Other | Source: Ambulatory Visit | Attending: Pulmonary Disease | Admitting: Pulmonary Disease

## 2022-05-22 ENCOUNTER — Ambulatory Visit
Admission: RE | Admit: 2022-05-22 | Discharge: 2022-05-22 | Disposition: A | Discharge status: Home / Self Care | Payer: Medicare Other | Source: Ambulatory Visit | Attending: Student | Admitting: Student

## 2022-05-22 VITALS — BP 135/85 | HR 57

## 2022-05-22 DIAGNOSIS — C3490 Malignant neoplasm of unspecified part of unspecified bronchus or lung: Secondary | ICD-10-CM | POA: Diagnosis not present

## 2022-05-22 DIAGNOSIS — J910 Malignant pleural effusion: Secondary | ICD-10-CM | POA: Diagnosis present

## 2022-05-22 DIAGNOSIS — R918 Other nonspecific abnormal finding of lung field: Secondary | ICD-10-CM | POA: Diagnosis not present

## 2022-05-22 LAB — BODY FLUID CELL COUNT WITH DIFFERENTIAL
Eos, Fluid: 2 %
Lymphs, Fluid: 94 %
Monocyte-Macrophage-Serous Fluid: 3 %
Neutrophil Count, Fluid: 0 %
Total Nucleated Cell Count, Fluid: 2421 cu mm

## 2022-05-22 LAB — GLUCOSE, PLEURAL OR PERITONEAL FLUID: Glucose, Fluid: 95 mg/dL

## 2022-05-22 LAB — LACTATE DEHYDROGENASE, PLEURAL OR PERITONEAL FLUID: LD, Fluid: 283 U/L — ABNORMAL HIGH (ref 3–23)

## 2022-05-22 LAB — PROTEIN, PLEURAL OR PERITONEAL FLUID: Total protein, fluid: 4.3 g/dL

## 2022-05-22 LAB — AMYLASE, PLEURAL OR PERITONEAL FLUID: Amylase, Fluid: 85 U/L

## 2022-05-22 MED ORDER — LIDOCAINE HCL (PF) 1 % IJ SOLN
10.0000 mL | Freq: Once | INTRAMUSCULAR | Status: AC
  Administered 2022-05-22: 10 mL via INTRADERMAL
  Filled 2022-05-22: qty 10

## 2022-05-22 NOTE — Procedures (Signed)
PROCEDURE SUMMARY:  Successful US guided right thoracentesis. Yielded 1.3 L of amber-colored fluid. Pt tolerated procedure well. No immediate complications.  Specimen sent for labs. CXR ordered; no post-procedure pneumothorax identified   EBL < 2 mL  Theresa Duty, NP 05/22/2022 9:39 AM

## 2022-05-23 LAB — ACID FAST SMEAR (AFB, MYCOBACTERIA): Acid Fast Smear: NEGATIVE

## 2022-05-25 ENCOUNTER — Encounter: Payer: Self-pay | Admitting: *Deleted

## 2022-05-25 LAB — CYTOLOGY - NON PAP

## 2022-05-25 NOTE — Progress Notes (Signed)
Per Dr. Tasia Catchings, pt needs referral to radiation oncology for multiple brain lesions seen on brain MRI. Pt scheduled with Dr. Baruch Gouty on 2/8 at 11am after pt is scheduled to follow up with Dr. Tasia Catchings. Phone call made to patient to review results. Informed of appt with Dr. Baruch Gouty and pt requested to move his appt sooner if possible. Informed pt that will see if Dr. Baruch Gouty has any other openings sooner and will call him back with new appt date/time. All questions answered during call. Instructed to call back with any questions or needs. Pt verbalized understanding.

## 2022-05-26 ENCOUNTER — Encounter: Payer: Self-pay | Admitting: Radiation Oncology

## 2022-05-26 ENCOUNTER — Ambulatory Visit
Admission: RE | Admit: 2022-05-26 | Discharge: 2022-05-26 | Disposition: A | Discharge status: Home / Self Care | Payer: Medicare Other | Source: Ambulatory Visit | Attending: Radiation Oncology | Admitting: Radiation Oncology

## 2022-05-26 ENCOUNTER — Encounter: Payer: Self-pay | Admitting: *Deleted

## 2022-05-26 ENCOUNTER — Ambulatory Visit
Admission: RE | Admit: 2022-05-26 | Discharge: 2022-05-26 | Disposition: A | Discharge status: Home / Self Care | Payer: Medicare Other | Source: Ambulatory Visit | Attending: Pulmonary Disease | Admitting: Pulmonary Disease

## 2022-05-26 VITALS — BP 124/84 | HR 76 | Temp 98.3°F | Resp 16 | Ht 70.0 in | Wt 197.0 lb

## 2022-05-26 DIAGNOSIS — R918 Other nonspecific abnormal finding of lung field: Secondary | ICD-10-CM | POA: Insufficient documentation

## 2022-05-26 DIAGNOSIS — C7931 Secondary malignant neoplasm of brain: Secondary | ICD-10-CM | POA: Insufficient documentation

## 2022-05-26 DIAGNOSIS — I10 Essential (primary) hypertension: Secondary | ICD-10-CM | POA: Diagnosis not present

## 2022-05-26 DIAGNOSIS — C3411 Malignant neoplasm of upper lobe, right bronchus or lung: Secondary | ICD-10-CM | POA: Insufficient documentation

## 2022-05-26 DIAGNOSIS — Z79899 Other long term (current) drug therapy: Secondary | ICD-10-CM | POA: Insufficient documentation

## 2022-05-26 DIAGNOSIS — J90 Pleural effusion, not elsewhere classified: Secondary | ICD-10-CM | POA: Diagnosis not present

## 2022-05-26 LAB — BODY FLUID CULTURE W GRAM STAIN
Culture: NO GROWTH
Gram Stain: NONE SEEN

## 2022-05-26 MED ORDER — DEXAMETHASONE 2 MG PO TABS: 2.0000 mg | ORAL_TABLET | Freq: Two times a day (BID) | ORAL | 2 refills | Status: DC

## 2022-05-26 NOTE — Consult Note (Signed)
NEW PATIENT EVALUATION  Name: Kurt Ewing  MRN: [id number]  Date:   05/26/2022     DOB: 10/06/1955   This 67 y.o. 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  REFERRING PHYSICIAN: Rusty Aus, MD  CHIEF COMPLAINT:  Chief Complaint  Patient presents with   Brain Mets    DIAGNOSIS: The primary encounter diagnosis was Lung mass. A diagnosis of Malignant neoplasm metastatic to brain [facility]) was also pertinent to this visit.   PREVIOUS INVESTIGATIONS: CT scans PET CT scans MRI of brain reviewed Clinical notes reviewed Cytology reports reviewed  HPI: Patient is a 67 year old male who presented with increasing cough right-sided chest pain and discomfort.  Was found to have a large right pleural effusion and right lung mass.  Initial CT scan showed a right upper lobe 5.6 x 3.8 cm mass consistent with primary bronchogenic carcinoma.  This abutted the major fissure.  He also had a moderate size right pleural effusion partially loculated.  He also had some round like mass opacity in dependent right lower lobe measuring 5.8 cm most likely atelectatic cyst.  He underwent thoracentesis although was nondiagnostic.  PET scan confirmed a 5.9 x 3.5 cm right hilar mass hypermetabolic with the epicenter apparently in the right upper lobe.  Also large pleural effusion.  No findings of malignancy outside the chest.  Brain MRI confirmed at least 13 lesions consistent with metastatic disease.  He is asymptomatic and in neurologic sense specifically denies any change in visual fields any focal neurologic deficits.  He is scheduled for bronchoscopy later this week.  Seen today for evaluation of his brain metastasis.  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    PAST  SURGICAL HISTORY:  Past Surgical History:  Procedure Laterality Date   COLONOSCOPY     KNEE ARTHROSCOPY Right 2005   TONSILLECTOMY  1970    FAMILY HISTORY: family history includes COPD in his brother; Heart disease in his father; Hypertension in his brother and father.  SOCIAL HISTORY:  reports that he has never smoked. He has never used smokeless tobacco. He reports current alcohol use. He reports that he does not currently use drugs.  ALLERGIES: Patient has no known allergies.  MEDICATIONS:  Current Outpatient Medications  Medication Sig Dispense Refill   dexamethasone (DECADRON) 2 MG tablet Take 1 tablet (2 mg total) by mouth 2 (two) times daily. 60 tablet 2   famotidine (PEPCID) 40 MG tablet Take 1 tablet by mouth at bedtime.     ibuprofen (ADVIL) 200 MG tablet Take 400 mg by mouth every 8 (eight) hours as needed.     lisinopril (ZESTRIL) 5 MG tablet Take 5 mg by mouth daily.     meloxicam (MOBIC) 15 MG tablet Take 15 mg by mouth daily as needed.     No current facility-administered medications for this encounter.    ECOG PERFORMANCE STATUS:  0 - Asymptomatic  REVIEW OF SYSTEMS: Patient denies any weight loss, fatigue, weakness, fever, chills or night sweats. Patient denies any loss of vision, blurred vision. Patient denies any ringing  of the ears or hearing loss. No irregular heartbeat. Patient denies heart murmur or history of fainting. Patient denies any chest pain or pain radiating to her upper extremities. Patient denies any shortness of breath, difficulty breathing at  night, cough or hemoptysis. Patient denies any swelling in the lower legs. Patient denies any nausea vomiting, vomiting of blood, or coffee ground material in the vomitus. Patient denies any stomach pain. Patient states has had normal bowel movements no significant constipation or diarrhea. Patient denies any dysuria, hematuria or significant nocturia. Patient denies any problems walking, swelling in the joints  or loss of balance. Patient denies any skin changes, loss of hair or loss of weight. Patient denies any excessive worrying or anxiety or significant depression. Patient denies any problems with insomnia. Patient denies excessive thirst, polyuria, polydipsia. Patient denies any swollen glands, patient denies easy bruising or easy bleeding. Patient denies any recent infections, allergies or URI. Patient "s visual fields have not changed significantly in recent time.   PHYSICAL EXAM: BP 124/84 (BP Location: Right Arm, Patient Position: Sitting, Cuff Size: Normal)   Pulse 76   Temp 98.3 F (36.8 C) (Tympanic)   Resp 16   Ht 5\' 10"  (1.778 m) Comment: stated ht  Wt 197 lb (89.4 kg)   BMI 28.27 kg/m  Well-developed well-nourished patient in NAD. HEENT reveals PERLA, EOMI, discs not visualized.  Oral cavity is clear. No oral mucosal lesions are identified. Neck is clear without evidence of cervical or supraclavicular adenopathy. Lungs are clear to A&P. Cardiac examination is essentially unremarkable with regular rate and rhythm without murmur rub or thrill. Abdomen is benign with no organomegaly or masses noted. Motor sensory and DTR levels are equal and symmetric in the upper and lower extremities. Cranial nerves II through XII are grossly intact. Proprioception is intact. No peripheral adenopathy or edema is identified. No motor or sensory levels are noted. Crude visual fields are within normal range.  LABORATORY DATA: Labs and cytology reports reviewed compatible with above-stated findings    RADIOLOGY RESULTS: MRI scan CT scans and PET scan all reviewed compatible with above-stated findings   IMPRESSION: Age for primary bronchogenic lung carcinoma with brain metastasis and 67 year old male  PLAN: At this time elect to go ahead with whole brain radiation.  I will plan on delivering 30 Gray in 10 fractions.  Risks and benefits of treatment occluding hair loss skin reaction fatigue alteration of blood  counts all were reviewed in detail with the patient.  I also will consider some radiation therapy to his chest along with concurrent chemotherapy to try to help alleviate some of his pleural effusion.  Will discuss that prior with Dr. Tasia Catchings and make further recommendations after we have established histologic diagnosis of his lung cancer.  Patient wife both comprehend the treatment plan well.  I personally set up and ordered CT simulation for tomorrow.  I would like to take this opportunity to thank you for allowing me to participate in the care of your patient.Noreene Filbert, MD

## 2022-05-26 NOTE — Progress Notes (Signed)
Met with patient during initial consult with Dr. Baruch Gouty to discuss recent brain MRI results and treatment options with radiation. All questions answered during visit. Reviewed upcoming appts. Instructed to call with any questions or needs. Pt verbalized understanding.

## 2022-05-27 ENCOUNTER — Ambulatory Visit
Admission: RE | Admit: 2022-05-27 | Discharge: 2022-05-27 | Disposition: A | Discharge status: Home / Self Care | Payer: Medicare Other | Source: Ambulatory Visit | Attending: Radiation Oncology | Admitting: Radiation Oncology

## 2022-05-27 ENCOUNTER — Telehealth: Payer: Self-pay | Admitting: *Deleted

## 2022-05-27 ENCOUNTER — Encounter
Admission: RE | Admit: 2022-05-27 | Discharge: 2022-05-27 | Disposition: A | Discharge status: Home / Self Care | Payer: Medicare Other | Source: Ambulatory Visit | Attending: Pulmonary Disease | Admitting: Pulmonary Disease

## 2022-05-27 DIAGNOSIS — I10 Essential (primary) hypertension: Secondary | ICD-10-CM | POA: Diagnosis not present

## 2022-05-27 DIAGNOSIS — Z1152 Encounter for screening for COVID-19: Secondary | ICD-10-CM | POA: Insufficient documentation

## 2022-05-27 DIAGNOSIS — Z8679 Personal history of other diseases of the circulatory system: Secondary | ICD-10-CM

## 2022-05-27 DIAGNOSIS — Z01818 Encounter for other preprocedural examination: Secondary | ICD-10-CM | POA: Insufficient documentation

## 2022-05-27 DIAGNOSIS — C7931 Secondary malignant neoplasm of brain: Secondary | ICD-10-CM | POA: Diagnosis present

## 2022-05-27 DIAGNOSIS — R0602 Shortness of breath: Secondary | ICD-10-CM

## 2022-05-27 DIAGNOSIS — C3491 Malignant neoplasm of unspecified part of right bronchus or lung: Secondary | ICD-10-CM | POA: Insufficient documentation

## 2022-05-27 DIAGNOSIS — Z01812 Encounter for preprocedural laboratory examination: Secondary | ICD-10-CM

## 2022-05-27 LAB — CHOLESTEROL, BODY FLUID: Cholesterol, Fluid: 97 mg/dL

## 2022-05-27 LAB — PROTIME-INR
INR: 1 (ref 0.8–1.2)
Prothrombin Time: 13.4 seconds (ref 11.4–15.2)

## 2022-05-27 LAB — APTT: aPTT: 33 seconds (ref 24–36)

## 2022-05-27 MED ORDER — DEXAMETHASONE 2 MG PO TABS: 2.0000 mg | ORAL_TABLET | Freq: Two times a day (BID) | ORAL | 2 refills | Status: DC

## 2022-05-27 NOTE — Telephone Encounter (Signed)
THe steroids that was to be ordered for patient was not received by pharmacy. I see that it was entered as a no print. Prescription resubmitted and confirmation of receipt received by pharmacy obtained

## 2022-05-28 ENCOUNTER — Other Ambulatory Visit: Payer: Medicare Other

## 2022-05-28 DIAGNOSIS — C3491 Malignant neoplasm of unspecified part of right bronchus or lung: Secondary | ICD-10-CM | POA: Insufficient documentation

## 2022-05-28 LAB — SARS CORONAVIRUS 2 (TAT 6-24 HRS): SARS Coronavirus 2: NEGATIVE

## 2022-05-29 ENCOUNTER — Ambulatory Visit: Payer: Medicare Other | Admitting: Certified Registered"

## 2022-05-29 ENCOUNTER — Encounter
Admission: RE | Disposition: A | Discharge status: Home / Self Care | Payer: Self-pay | Source: Home / Self Care | Attending: Pulmonary Disease

## 2022-05-29 ENCOUNTER — Other Ambulatory Visit: Payer: Self-pay | Admitting: *Deleted

## 2022-05-29 ENCOUNTER — Ambulatory Visit: Payer: Medicare Other

## 2022-05-29 ENCOUNTER — Ambulatory Visit
Admission: RE | Admit: 2022-05-29 | Discharge: 2022-05-29 | Disposition: A | Discharge status: Home / Self Care | Payer: Medicare Other | Attending: Pulmonary Disease | Admitting: Pulmonary Disease

## 2022-05-29 ENCOUNTER — Encounter: Payer: Self-pay | Admitting: *Deleted

## 2022-05-29 ENCOUNTER — Other Ambulatory Visit: Payer: Self-pay

## 2022-05-29 ENCOUNTER — Ambulatory Visit: Payer: Medicare Other | Admitting: Urgent Care

## 2022-05-29 DIAGNOSIS — J910 Malignant pleural effusion: Secondary | ICD-10-CM | POA: Insufficient documentation

## 2022-05-29 DIAGNOSIS — C771 Secondary and unspecified malignant neoplasm of intrathoracic lymph nodes: Secondary | ICD-10-CM | POA: Diagnosis not present

## 2022-05-29 DIAGNOSIS — I10 Essential (primary) hypertension: Secondary | ICD-10-CM | POA: Diagnosis not present

## 2022-05-29 DIAGNOSIS — R918 Other nonspecific abnormal finding of lung field: Secondary | ICD-10-CM | POA: Diagnosis present

## 2022-05-29 DIAGNOSIS — C3491 Malignant neoplasm of unspecified part of right bronchus or lung: Secondary | ICD-10-CM | POA: Insufficient documentation

## 2022-05-29 DIAGNOSIS — Z79899 Other long term (current) drug therapy: Secondary | ICD-10-CM | POA: Insufficient documentation

## 2022-05-29 DIAGNOSIS — Z5112 Encounter for antineoplastic immunotherapy: Secondary | ICD-10-CM | POA: Diagnosis present

## 2022-05-29 DIAGNOSIS — Z01812 Encounter for preprocedural laboratory examination: Secondary | ICD-10-CM

## 2022-05-29 DIAGNOSIS — R0602 Shortness of breath: Secondary | ICD-10-CM

## 2022-05-29 DIAGNOSIS — C3411 Malignant neoplasm of upper lobe, right bronchus or lung: Secondary | ICD-10-CM | POA: Insufficient documentation

## 2022-05-29 DIAGNOSIS — Z5111 Encounter for antineoplastic chemotherapy: Secondary | ICD-10-CM | POA: Insufficient documentation

## 2022-05-29 DIAGNOSIS — C7931 Secondary malignant neoplasm of brain: Secondary | ICD-10-CM

## 2022-05-29 DIAGNOSIS — Z8679 Personal history of other diseases of the circulatory system: Secondary | ICD-10-CM

## 2022-05-29 HISTORY — PX: VIDEO BRONCHOSCOPY WITH ENDOBRONCHIAL ULTRASOUND: SHX6177

## 2022-05-29 LAB — KOH PREP
KOH Prep: NONE SEEN
Special Requests: NORMAL

## 2022-05-29 LAB — POCT I-STAT, CHEM 8
BUN: 20 mg/dL (ref 8–23)
Calcium, Ion: 1.12 mmol/L — ABNORMAL LOW (ref 1.15–1.40)
Chloride: 105 mmol/L (ref 98–111)
Creatinine, Ser: 1.2 mg/dL (ref 0.61–1.24)
Glucose, Bld: 100 mg/dL — ABNORMAL HIGH (ref 70–99)
HCT: 43 % (ref 39.0–52.0)
Hemoglobin: 14.6 g/dL (ref 13.0–17.0)
Potassium: 4.6 mmol/L (ref 3.5–5.1)
Sodium: 140 mmol/L (ref 135–145)
TCO2: 26 mmol/L (ref 22–32)

## 2022-05-29 LAB — MISC LABCORP TEST (SEND OUT): Labcorp test code: 5367

## 2022-05-29 SURGERY — ROBOTIC ASSISTED NAVIGATIONAL BRONCHOSCOPY
Anesthesia: General

## 2022-05-29 MED ORDER — ONDANSETRON HCL 4 MG/2ML IJ SOLN
INTRAMUSCULAR | Status: DC | PRN
  Administered 2022-05-29: 4 mg via INTRAVENOUS

## 2022-05-29 MED ORDER — FENTANYL CITRATE (PF) 100 MCG/2ML IJ SOLN
INTRAMUSCULAR | Status: AC
  Filled 2022-05-29: qty 2

## 2022-05-29 MED ORDER — LIDOCAINE HCL URETHRAL/MUCOSAL 2 % EX GEL
1.0000 | Freq: Once | CUTANEOUS | Status: DC
  Filled 2022-05-29: qty 5

## 2022-05-29 MED ORDER — CHLORHEXIDINE GLUCONATE 0.12 % MT SOLN: 15.0000 mL | Freq: Once | OROMUCOSAL | Status: AC

## 2022-05-29 MED ORDER — PHENYLEPHRINE HCL-NACL 20-0.9 MG/250ML-% IV SOLN
INTRAVENOUS | Status: DC | PRN
  Administered 2022-05-29: 50 ug/min via INTRAVENOUS

## 2022-05-29 MED ORDER — LACTATED RINGERS IV SOLN: INTRAVENOUS | Status: DC

## 2022-05-29 MED ORDER — FENTANYL CITRATE (PF) 100 MCG/2ML IJ SOLN
INTRAMUSCULAR | Status: DC | PRN
  Administered 2022-05-29: 100 ug via INTRAVENOUS
  Administered 2022-05-29: 50 ug via INTRAVENOUS

## 2022-05-29 MED ORDER — LIDOCAINE HCL (CARDIAC) PF 100 MG/5ML IV SOSY
PREFILLED_SYRINGE | INTRAVENOUS | Status: DC | PRN
  Administered 2022-05-29 (×2): 100 mg via INTRAVENOUS

## 2022-05-29 MED ORDER — LACTATED RINGERS IV SOLN: INTRAVENOUS | Status: DC | PRN

## 2022-05-29 MED ORDER — PHENYLEPHRINE HCL (PRESSORS) 10 MG/ML IV SOLN
INTRAVENOUS | Status: DC | PRN
  Administered 2022-05-29: 160 ug via INTRAVENOUS
  Administered 2022-05-29: 80 ug via INTRAVENOUS
  Administered 2022-05-29: 160 ug via INTRAVENOUS
  Administered 2022-05-29: 80 ug via INTRAVENOUS

## 2022-05-29 MED ORDER — PROPOFOL 10 MG/ML IV BOLUS
INTRAVENOUS | Status: DC | PRN
  Administered 2022-05-29: 160 mg via INTRAVENOUS
  Administered 2022-05-29 (×2): 50 mg via INTRAVENOUS
  Administered 2022-05-29: 30 mg via INTRAVENOUS
  Administered 2022-05-29: 50 mg via INTRAVENOUS

## 2022-05-29 MED ORDER — PHENYLEPHRINE HCL-NACL 20-0.9 MG/250ML-% IV SOLN
INTRAVENOUS | Status: AC
  Filled 2022-05-29: qty 250

## 2022-05-29 MED ORDER — PHENYLEPHRINE HCL 0.25 % NA SOLN
1.0000 | Freq: Four times a day (QID) | NASAL | Status: DC | PRN
  Filled 2022-05-29: qty 15

## 2022-05-29 MED ORDER — FENTANYL CITRATE (PF) 100 MCG/2ML IJ SOLN: 25.0000 ug | INTRAMUSCULAR | Status: DC | PRN

## 2022-05-29 MED ORDER — PROPOFOL 10 MG/ML IV BOLUS
INTRAVENOUS | Status: AC
  Filled 2022-05-29: qty 20

## 2022-05-29 MED ORDER — OXYCODONE HCL 5 MG/5ML PO SOLN: 5.0000 mg | Freq: Once | ORAL | Status: DC | PRN

## 2022-05-29 MED ORDER — ACETAMINOPHEN 10 MG/ML IV SOLN: 1000.0000 mg | Freq: Once | INTRAVENOUS | Status: DC | PRN

## 2022-05-29 MED ORDER — SUGAMMADEX SODIUM 200 MG/2ML IV SOLN
INTRAVENOUS | Status: DC | PRN
  Administered 2022-05-29 (×2): 200 mg via INTRAVENOUS

## 2022-05-29 MED ORDER — MIDAZOLAM HCL 2 MG/2ML IJ SOLN
INTRAMUSCULAR | Status: DC | PRN
  Administered 2022-05-29: 2 mg via INTRAVENOUS

## 2022-05-29 MED ORDER — ESMOLOL HCL 100 MG/10ML IV SOLN
INTRAVENOUS | Status: DC | PRN
  Administered 2022-05-29: 20 mg via INTRAVENOUS
  Administered 2022-05-29 (×2): 30 mg via INTRAVENOUS

## 2022-05-29 MED ORDER — BUTAMBEN-TETRACAINE-BENZOCAINE 2-2-14 % EX AERO
1.0000 | INHALATION_SPRAY | Freq: Once | CUTANEOUS | Status: DC
  Filled 2022-05-29: qty 20

## 2022-05-29 MED ORDER — MIDAZOLAM HCL 2 MG/2ML IJ SOLN
INTRAMUSCULAR | Status: AC
  Filled 2022-05-29: qty 2

## 2022-05-29 MED ORDER — OXYCODONE HCL 5 MG PO TABS: 5.0000 mg | ORAL_TABLET | Freq: Once | ORAL | Status: DC | PRN

## 2022-05-29 MED ORDER — ORAL CARE MOUTH RINSE: 15.0000 mL | Freq: Once | OROMUCOSAL | Status: AC

## 2022-05-29 MED ORDER — ROCURONIUM BROMIDE 100 MG/10ML IV SOLN
INTRAVENOUS | Status: DC | PRN
  Administered 2022-05-29: 50 mg via INTRAVENOUS
  Administered 2022-05-29: 20 mg via INTRAVENOUS

## 2022-05-29 MED ORDER — LIDOCAINE HCL (PF) 1 % IJ SOLN
30.0000 mL | Freq: Once | INTRAMUSCULAR | Status: DC
  Filled 2022-05-29: qty 30

## 2022-05-29 MED ORDER — DEXAMETHASONE SODIUM PHOSPHATE 10 MG/ML IJ SOLN
INTRAMUSCULAR | Status: DC | PRN
  Administered 2022-05-29: 10 mg via INTRAVENOUS

## 2022-05-29 MED ORDER — ONDANSETRON HCL 4 MG/2ML IJ SOLN: 4.0000 mg | Freq: Once | INTRAMUSCULAR | Status: DC | PRN

## 2022-05-29 MED ORDER — CHLORHEXIDINE GLUCONATE 0.12 % MT SOLN
OROMUCOSAL | Status: AC
  Administered 2022-05-29: 15 mL via OROMUCOSAL
  Filled 2022-05-29: qty 15

## 2022-05-29 MED ORDER — VASOPRESSIN 20 UNIT/ML IV SOLN
INTRAVENOUS | Status: DC | PRN
  Administered 2022-05-29: 1 [IU] via INTRAVENOUS
  Administered 2022-05-29: 2 [IU] via INTRAVENOUS

## 2022-05-29 NOTE — H&P (Signed)
PULMONOLOGY         Date: 05/29/2022,   MRN# [id number] Kurt Ewing February 21, 1956     AdmissionWeight: 89.4 kg                 CurrentWeight: 89.4 kg  Referring provider: Dr Sabra Heck   CHIEF COMPLAINT:   Right lung mass with hilar lymphadenopathy   HISTORY OF PRESENT ILLNESS   This is a 67 year old male with a history of GERD, essential hypertension, patient send never smoker recently seen on outpatient with abnormal chest imaging.  Has surprisingly little symptoms and is able to perform well with ability to jog over a mile nonstop at this moment.  His CT shows a perihilar mass measuring approximately 6 x 5 cm with a satellite nodularity and linear segmental atelectasis or scarring.  There is additional areas of masslike focal consolidative opacity in the right middle and lower lobes without substantial change from the previous chest CT when we initially saw him.  There was associated pleural effusions which now has been drained x 2 via thoracentesis.  This did show abnormal atypical findings however to be inconclusive and patient requires additional biopsies for tissue diagnosis and staging.  Patient is here today for bronchoscopic evaluation with airway inspection, bronchoalveolar lavage, transbronchial and endobronchial biopsies and endobronchial ultrasound assisted lymph node biopsies.   PAST MEDICAL HISTORY   Past Medical History:  Diagnosis Date   Heart burn    Hypertension    Lung mass 04/2022   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     SURGICAL HISTORY   Past Surgical History:  Procedure Laterality Date   COLONOSCOPY     KNEE ARTHROSCOPY Right 2005   TONSILLECTOMY  1970     FAMILY HISTORY   Family History  Problem Relation Age of Onset   Hypertension Father    Heart disease Father    Hypertension Brother    COPD Brother      SOCIAL HISTORY   Social History   Tobacco Use   Smoking status: Never   Smokeless tobacco: Never  Vaping Use    Vaping Use: Never used  Substance Use Topics   Alcohol use: Yes    Comment: weekly   Drug use: Not Currently     MEDICATIONS    Home Medication:    Current Medication:  Current Facility-Administered Medications:    lactated ringers infusion, , Intravenous, Continuous, Piscitello, Precious Haws, MD, Last Rate: 10 mL/hr at 05/29/22 0641, New Bag at 05/29/22 0641    ALLERGIES   Patient has no known allergies.     REVIEW OF SYSTEMS    Review of Systems:  Gen:  Denies  fever, sweats, chills weigh loss  HEENT: Denies blurred vision, double vision, ear pain, eye pain, hearing loss, nose bleeds, sore throat Cardiac:  No dizziness, chest pain or heaviness, chest tightness,edema Resp:   reports minimal dyspnea Gi: Denies swallowing difficulty, stomach pain, nausea or vomiting, diarrhea, constipation, bowel incontinence Gu:  Denies bladder incontinence, burning urine Ext:   Denies Joint pain, stiffness or swelling Skin: Denies  skin rash, easy bruising or bleeding or hives Endoc:  Denies polyuria, polydipsia , polyphagia or weight change Psych:   Denies depression, insomnia or hallucinations   Other:  All other systems negative   VS: BP (!) 135/91   Pulse 76   Temp 98.8 F (37.1 C) (Temporal)   Resp 16   Ht 5\' 10"  (1.778 m)   Wt 89.4 kg  SpO2 95%   BMI 28.28 kg/m      PHYSICAL EXAM    GENERAL:NAD, no fevers, chills, no weakness no fatigue HEAD: Normocephalic, atraumatic.  EYES: Pupils equal, round, reactive to light. Extraocular muscles intact. No scleral icterus.  MOUTH: Moist mucosal membrane. Dentition intact. No abscess noted.  EAR, NOSE, THROAT: Clear without exudates. No external lesions.  NECK: Supple. No thyromegaly. No nodules. No JVD.  PULMONARY: Auscultation bilaterally CARDIOVASCULAR: S1 and S2. Regular rate and rhythm. No murmurs, rubs, or gallops. No edema. Pedal pulses 2+ bilaterally.  GASTROINTESTINAL: Soft, nontender, nondistended. No masses.  Positive bowel sounds. No hepatosplenomegaly.  MUSCULOSKELETAL: No swelling, clubbing, or edema. Range of motion full in all extremities.  NEUROLOGIC: Cranial nerves II through XII are intact. No gross focal neurological deficits. Sensation intact. Reflexes intact.  SKIN: No ulceration, lesions, rashes, or cyanosis. Skin warm and dry. Turgor intact.  PSYCHIATRIC: Mood, affect within normal limits. The patient is awake, alert and oriented x 3. Insight, judgment intact.       IMAGING    IMPRESSION: 1. Similar appearance of the right parahilar mass lesion with adjacent areas of satellite nodularity and linear segmental atelectasis/scarring. 2. Additional areas of masslike focal consolidative opacity in the right middle and lower lobes without substantial change since the recent CT chest with contrast. 3. No substantial change small to moderate loculated right pleural effusion. 4. Stable tiny left pulmonary nodules. 5. Stable 2 cm right adrenal adenoma. 6.  Aortic Atherosclerosis (ICD10-I70.0).     Electronically Signed   By: Misty Stanley M.D.   On: 05/27/2022 09:13  ASSESSMENT/PLAN   Right lung mass with hilar lymphadenopathy -Patient is here today for lung biopsy and lymph node biopsies this will be done utilizing [employer] robotic platform with body vision for confirmation and endobronchial ultrasound for lymph node biopsies.  Bronchoalveolar lavage and airway inspection was performed prior to this.     -Reviewed risks/complications and benefits with patient, risks include infection, pneumothorax/pneumomediastinum which may require chest tube placement as well as overnight/prolonged hospitalization and possible mechanical ventilation. Other risks include bleeding and very rarely death.  Patient understands risks and wishes to proceed.  Additional questions were answered, and patient is aware that post procedure patient will be going home with family and may experience cough with  possible clots on expectoration as well as phlegm which may last few days as well as hoarseness of voice post intubation and mechanical ventilation.            Thank you for allowing me to participate in the care of this patient.   Patient/Family are satisfied with care plan and all questions have been answered.    Provider disclosure: Patient with at least one acute or chronic illness or injury that poses a threat to life or bodily function and is being managed actively during this encounter.  All of the below services have been performed independently by signing provider:  review of prior documentation from internal and or external health records.  Review of previous and current lab results.  Interview and comprehensive assessment during patient visit today. Review of current and previous chest radiographs/CT scans. Discussion of management and test interpretation with health care team and patient/family.   This document was prepared using Dragon voice recognition software and may include unintentional dictation errors.     Ottie Glazier, M.D.  Division of Pulmonary & Critical Care Medicine

## 2022-05-29 NOTE — Anesthesia Procedure Notes (Signed)
Procedure Name: Intubation Date/Time: 05/29/2022 8:02 AM  Performed by: Patience Musca., CRNAPre-anesthesia Checklist: Patient identified, Patient being monitored, Timeout performed, Emergency Drugs available and Suction available Patient Re-evaluated:Patient Re-evaluated prior to induction Oxygen Delivery Method: Circle system utilized Preoxygenation: Pre-oxygenation with 100% oxygen Induction Type: IV induction Ventilation: Mask ventilation without difficulty Laryngoscope Size: McGraph and 4 Grade View: Grade I Tube type: Oral Tube size: 9.0 mm Number of attempts: 1 Airway Equipment and Method: Stylet Placement Confirmation: ETT inserted through vocal cords under direct vision, positive ETCO2 and breath sounds checked- equal and bilateral Secured at: 28 cm Tube secured with: Tape Dental Injury: Teeth and Oropharynx as per pre-operative assessment

## 2022-05-29 NOTE — Discharge Instructions (Signed)
:   AMBULATORY SURGERY  DISCHARGE INSTRUCTIONS   The drugs that you were given will stay in your system until tomorrow so for the next 24 hours you should not:  Drive an automobile Make any legal decisions Drink any alcoholic beverage   You may resume regular meals tomorrow.  Today it is better to start with liquids and gradually work up to solid foods.  You may eat anything you prefer, but it is better to start with liquids, then soup and crackers, and gradually work up to solid foods.   Please notify your doctor immediately if you have any unusual bleeding, trouble breathing, redness and pain at the surgery site, drainage, fever, or pain not relieved by medication.    Additional Instructions:   Please contact your physician with any problems or Same Day Surgery at [phone], Monday through Friday 6 am to 4 pm, or [place] at [facility] number at [phone]. Please contact your physician with any problems or Same Day Surgery at [phone], Monday through Friday 6 am to 4 pm, or [place] at [facility] number at [phone].

## 2022-05-29 NOTE — Procedures (Signed)
ELECTROMAGNETIC NAVIGATIONAL BRONCHOSCOPY PROCEDURE NOTE  FIBEROPTIC BRONCHOSCOPY WITH THERAPEUTIC ASPIRATION OF TRACHEOBRONCHIAL TREE AND BAL PROCEDURE NOTE  ENDOBRONCHIAL ULTRASOUND  x3 LYMPH NODES PROCEDURE NOTE    Flexible bronchoscopy was performed  by : Lanney Gins MD  assistance by : 1)Repiratory therapist  and 2)[employer] staff and 3) Anesthesia team and 4) Flouroscopy team and 5) [place] supporting staff   Indication for the procedure was :  Pre-procedural H&P. The following assessment was performed on the day of the procedure prior to initiating sedation History:  Chest pain n Dyspnea y Hemoptysis n Cough y Fever n Other pertinent items n  Examination Vital signs -reviewed as per nursing documentation today Cardiac    Murmurs: n  Rubs : n  Gallop: n Lungs Wheezing: n Rales : n Rhonchi :y  Other pertinent findings: none  Pre-procedural assessment for Procedural Sedation included: Depth of sedation: As per anesthesia team  ASA Classification:  2 Mallampati airway assessment: 3    Medication list reviewed: y  The patient's interval history was taken and revealed: no new complaints The pre- procedure physical examination revealed: No new findings Refer to prior clinic note for details.  Informed Consent: Informed consent was obtained from:  patient after explanation of procedure and risks, benefits, as well as alternative procedures available.  Explanation of level of sedation and possible transfusion was also provided.    Procedural Preparation: Time out was performed and patient was identified by name and birthdate and procedure to be performed and side for sampling, if any, was specified. Pt was intubated by anesthesia.  The patient was appropriately draped.   Fiberoptic bronchoscopy with airway inspection and BAL Procedure findings:  Bronchoscope was inserted via ETT  without difficulty.  Posterior oropharynx, epiglottis, arytenoids, false  cords and vocal cords were not visualized as these were bypassed by endotracheal tube. The distal trachea was normal in circumference and appearance without mucosal, cartilaginous or branching abnormalities.  The main carina was mildly splayed . All right and left lobar airways were visualized to the Subsegmental level.  Sub- sub segmental carinae were identified in all the distal airways.   Secretions were visible in the following airways and appeared to be thick phlegm at right lower lobe which was evacuated via multiple aspirations. The mucosa was : friable at RUL  Airways were notable for:        exophytic lesions :n       extrinsic compression in the following distributions: n.       Friable mucosa: y       Neurosurgeon /pigmentation: n   BAL WAS PERFORMED AT RUL AND RML WITH CELLULAR MUCOID RETURN SENT FOR MICROBIOLOGY AND CYTOLOGY  Post procedure Diagnosis:   MUCUS PLUGGING OF RLL, SPLAYED MAIN CARINA, FRIABLE MUCOSA OF RUL.      Electromagnetic Navigational Bronchoscopy Procedure Findings:  After appropriate CT-guided planning robotic broncoscope was advanced via endotracheal tube to target lesion.  Post appropriate planning and registration peripheral navigation was used to obtain tissue sammpling.   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 Examination began to evaluate for pathologically enlarged lymph nodes starting on the LEFT  side progressing to the RIGHT side.  All lymph node biopsies performed  with 22G needle. Lymph node biopsies were sent in cytolite for all stations. STATION 4L - 0.5CM - NOT BIOPSIED STATION 10L - 0.6CM - NOT BIOPSIED STATION 7 - 1.4CM - 3 PASSES  STATION 10R -  1.3CM - 3 PASSES STATION 11R - 2.0 CM - 4 PASSES  Post procedure diagnosis:  HILAR AND MEDIASTINAL LYMPHADENOPATHY CONSISTENT WITH METASTATIC DISEASE   Specimens obtained included:                      Cytology brushes : RUL  Broncho-alveolar lavage site:RUL/RLL  sent for KOH/FUNGAL CULTURES                             40 ml volume infused 20 ml volume returned with CELLULAR/MUCOID appearance  Fluoroscopy Used: YES ;        Pictorial documentation attached: NONE            Immediate sampling complications included:NONE  Epinephrine NONE ml was used topically  The bronchoscopy was terminated due to completion of the planned procedure and the bronchoscope was removed.   Total dosage of Lidocaine was NONE mg Total fluoroscopy time was AS PER IR minutes  Supplemental oxygen was provided at AS PER ANESTHESIA lpm by nasal canula post operatively  Estimated Blood loss: EXPECTED <10CCcc.  Complications included:  NONE IMMEDIATE   Preliminary CXR findings :  IN PROCESS   Disposition: HOME WITH WIFE   Follow up with Dr. Lanney Gins in 5 days for result discussion.     Kurt Glazier MD  [place] Division of Pulmonary & Critical Care Medicine

## 2022-05-29 NOTE — Transfer of Care (Signed)
Immediate Anesthesia Transfer of Care Note  Patient: Kurt Ewing  Procedure(s) Performed: ROBOTIC ASSISTED NAVIGATIONAL BRONCHOSCOPY VIDEO BRONCHOSCOPY WITH ENDOBRONCHIAL ULTRASOUND  Patient Location: PACU  Anesthesia Type:General  Level of Consciousness: awake, alert , and oriented  Airway & Oxygen Therapy: Patient Spontanous Breathing  Post-op Assessment: Report given to RN and Post -op Vital signs reviewed and stable  Post vital signs: Reviewed and stable  Last Vitals:  Vitals Value Taken Time  BP 101/78 05/29/22 0953  Temp    Pulse 66 05/29/22 0958  Resp 22 05/29/22 0958  SpO2 94 % 05/29/22 0958  Vitals shown include unvalidated device data.  Last Pain:  Vitals:   05/29/22 0618  TempSrc: Temporal  PainSc: 0-No pain         Complications: No notable events documented.

## 2022-05-29 NOTE — Anesthesia Preprocedure Evaluation (Signed)
Anesthesia Evaluation  Patient identified by MRN, date of birth, ID band Patient awake    Reviewed: Allergy & Precautions, NPO status , Patient's Chart, lab work & pertinent test results  History of Anesthesia Complications Negative for: history of anesthetic complications  Airway Mallampati: II  TM Distance: >3 FB Neck ROM: Full    Dental no notable dental hx. 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  No respiratory symptoms. No other lung disease hx, never smoker   Pulmonary exam normal breath sounds clear to auscultation       Cardiovascular Exercise Tolerance: Good METShypertension, Pt. on medications (-) CAD and (-) Past MI (-) dysrhythmias  Rhythm:Regular Rate:Normal - Systolic murmurs    Neuro/Psych negative neurological ROS  negative psych ROS   GI/Hepatic ,neg GERD  ,,(+)     (-) substance abuse    Endo/Other  neg diabetes    Renal/GU negative Renal ROS     Musculoskeletal   Abdominal   Peds  Hematology   Anesthesia Other Findings Past Medical History: No date: Heart burn No date: Hypertension 04/2022: Lung mass 04/2022: Malignant pleural effusion  Reproductive/Obstetrics                             Anesthesia Physical Anesthesia Plan  ASA: 3  Anesthesia Plan: General   Post-op Pain Management: Minimal or no pain anticipated   Induction: Intravenous  PONV Risk Score and Plan: 2 and Ondansetron, Dexamethasone and Midazolam  Airway Management Planned: Oral ETT and Video Laryngoscope Planned  Additional Equipment: None  Intra-op Plan:   Post-operative Plan: Extubation in OR  Informed Consent: I have reviewed the patients History and Physical, chart, labs and discussed the procedure including the risks,  benefits and alternatives for the proposed anesthesia with the patient or authorized representative who has indicated his/her understanding and acceptance.     Dental advisory given  Plan Discussed with: CRNA and Surgeon  Anesthesia Plan Comments: (Discussed risks of anesthesia with patient, including PONV, sore throat, lip/dental/eye damage. Rare risks discussed as well, such as cardiorespiratory and neurological sequelae, and allergic reactions. Discussed the role of CRNA in patient's perioperative care. Patient understands.)       Anesthesia Quick Evaluation

## 2022-05-29 NOTE — Anesthesia Postprocedure Evaluation (Signed)
Anesthesia Post Note  Patient: Kurt Ewing  Procedure(s) Performed: ROBOTIC ASSISTED NAVIGATIONAL BRONCHOSCOPY VIDEO BRONCHOSCOPY WITH ENDOBRONCHIAL ULTRASOUND  Patient location during evaluation: PACU Anesthesia Type: General Level of consciousness: awake and alert Pain management: pain level controlled Vital Signs Assessment: post-procedure vital signs reviewed and stable Respiratory status: spontaneous breathing, nonlabored ventilation, respiratory function stable and patient connected to nasal cannula oxygen Cardiovascular status: blood pressure returned to baseline and stable Postop Assessment: no apparent nausea or vomiting Anesthetic complications: no   There were no known notable events for this encounter.   Last Vitals:  Vitals:   05/29/22 1015 05/29/22 1033  BP: 107/72 123/73  Pulse: 67 66  Resp: 16 16  Temp:  (!) 36.1 C  SpO2: 95% 94%    Last Pain:  Vitals:   05/29/22 1033  TempSrc: Temporal  PainSc:                  Arita Miss

## 2022-06-02 ENCOUNTER — Other Ambulatory Visit: Payer: Self-pay | Admitting: Pathology

## 2022-06-02 LAB — CYTOLOGY - NON PAP

## 2022-06-02 LAB — SURGICAL PATHOLOGY

## 2022-06-03 ENCOUNTER — Encounter: Payer: Self-pay | Admitting: *Deleted

## 2022-06-03 ENCOUNTER — Inpatient Hospital Stay (HOSPITAL_BASED_OUTPATIENT_CLINIC_OR_DEPARTMENT_OTHER): Payer: Medicare Other | Admitting: Oncology

## 2022-06-03 ENCOUNTER — Ambulatory Visit
Admission: RE | Admit: 2022-06-03 | Discharge: 2022-06-03 | Disposition: A | Discharge status: Home / Self Care | Payer: Medicare Other | Source: Ambulatory Visit | Attending: Radiation Oncology | Admitting: Radiation Oncology

## 2022-06-03 ENCOUNTER — Encounter: Payer: Self-pay | Admitting: Oncology

## 2022-06-03 ENCOUNTER — Inpatient Hospital Stay: Payer: Medicare Other | Attending: Oncology | Admitting: Hospice and Palliative Medicine

## 2022-06-03 ENCOUNTER — Inpatient Hospital Stay: Payer: Medicare Other

## 2022-06-03 VITALS — BP 145/98 | HR 86 | Temp 96.5°F | Resp 18 | Wt 200.7 lb

## 2022-06-03 DIAGNOSIS — Z5112 Encounter for antineoplastic immunotherapy: Secondary | ICD-10-CM | POA: Diagnosis not present

## 2022-06-03 DIAGNOSIS — C7931 Secondary malignant neoplasm of brain: Secondary | ICD-10-CM | POA: Diagnosis not present

## 2022-06-03 DIAGNOSIS — Z79899 Other long term (current) drug therapy: Secondary | ICD-10-CM | POA: Insufficient documentation

## 2022-06-03 DIAGNOSIS — J910 Malignant pleural effusion: Secondary | ICD-10-CM | POA: Insufficient documentation

## 2022-06-03 DIAGNOSIS — C3411 Malignant neoplasm of upper lobe, right bronchus or lung: Secondary | ICD-10-CM | POA: Insufficient documentation

## 2022-06-03 DIAGNOSIS — C3490 Malignant neoplasm of unspecified part of unspecified bronchus or lung: Secondary | ICD-10-CM

## 2022-06-03 DIAGNOSIS — Z7189 Other specified counseling: Secondary | ICD-10-CM

## 2022-06-03 DIAGNOSIS — Z5111 Encounter for antineoplastic chemotherapy: Secondary | ICD-10-CM | POA: Insufficient documentation

## 2022-06-03 NOTE — Progress Notes (Signed)
DISCONTINUE ON PATHWAY REGIMEN - Non-Small Cell Lung     A cycle is every 21 days:     Paclitaxel      Carboplatin      Bevacizumab-xxxx   **Always confirm dose/schedule in your pharmacy ordering system**  REASON: Other Reason PRIOR TREATMENT: [id number]: Carboplatin AUC=6 + Paclitaxel 200 mg/m2 + Bevacizumab 15 mg/kg q21 Days x 1 Cycle TREATMENT RESPONSE: Unable to Evaluate  START ON PATHWAY REGIMEN - Non-Small Cell Lung     A cycle is every 21 days:     Pemetrexed      Carboplatin   **Always confirm dose/schedule in your pharmacy ordering system**  Patient Characteristics: Stage IV Metastatic, Nonsquamous, Awaiting Molecular Test Results and Need to Start Chemotherapy, PS = 0, 1 Therapeutic Status: Stage IV Metastatic Histology: Nonsquamous Cell Broad Molecular Profiling Status: Awaiting Molecular Test Results and Need to Start Chemotherapy ECOG Performance Status: 0 Intent of Therapy: Non-Curative / Palliative Intent, Discussed with Patient

## 2022-06-03 NOTE — Assessment & Plan Note (Signed)
Discussed with patient that his condition was not curable.  Treatment is with palliative intent. Discussed about palliative care consultation.

## 2022-06-03 NOTE — Progress Notes (Signed)
Hematology/Oncology Consult Note [phone] [phone]    CHIEF COMPLAINTS/PURPOSE OF CONSULTATION:  Metastatic non-small cell lung cancer, brain metastasis.   ASSESSMENT & PLAN:   Cancer Staging  Metastatic non-small cell lung cancer [facility]) Staging form: Lung, AJCC 8th Edition - Clinical stage from 05/29/2022: Stage IV (cT3, cN2, cM1) - Signed by Earlie Server, MD on 06/03/2022    Metastatic non-small cell lung cancer [facility]) Imaging results and pathology results were reviewed and discussed with patient.  The diagnosis of stage IV non-small cell lung cancer and care plan were discussed with patient in detail.  The goal of adjuvant chemotherapy treatment is with palliative intent  Limited quality of biopsy tissue for NGS.  I will check liquid biopsy. If patient has targetable mutation, he will start with brachytherapy. If patient does not have targetable mutations, I recommend immunotherapy +/- chemotherapy.  Pending PD-L1 status. Chemotherapy education was provided.  We had discussed the composition of chemotherapy regimen, length of chemo cycle, duration of treatment and the time to assess response to treatment.   I explained to the patient the risks and benefits of chemotherapy including all but not limited to hair loss, mouth sore, nausea, vomiting, diarrhea, low blood counts, bleeding, neuropathy, and risk of life threatening infection and even death, secondary malignancy etc.  I discussed the mechanism of action and rationale of using immunotherapy. Discussed the potential side effects of immunotherapy including but not limited to diarrhea; skin rash; respiratory failure, kidney failure, mental status change, elevated LFTs/liver failure,endocrine abnormalities, acute deterioration  and even death,etc.  patient voices understanding and agrees with above plan   # Chemotherapy education; Supportive care measures are necessary for patient well-being and will be provided as  necessary.    Goals of care, counseling/discussion Discussed with patient that his condition was not curable.  Treatment is with palliative intent. Discussed about palliative care consultation.  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  Status post diagnostic thoracentesis.  Cytology not definitive. He is currently not symptomatic.  Asked patient to notify office if he develops symptoms.  Metastasis to brain [facility]) Patient has establish care with radiation oncology for palliative radiation. He has been recommended to start on dexamethasone   No orders of the defined types were placed in this encounter.  Follow-up TBD All questions were answered. The patient knows to call the clinic with any problems, questions or concerns.  Earlie Server, MD, PhD [facility] Hematology Oncology 06/03/2022   HISTORY OF PRESENTING ILLNESS:  Kurt Ewing 67 y.o. male presents to establish care for lung mass and malignant pleural effusion.  Patient is a lifelong never smoker.  Oncology History  Metastatic non-small cell lung cancer ([place])  04/30/2022 Imaging   CT chest w contrast showed  1. Perihilar right upper lobe masslike opacity measuring 3.4 x 5.6 x 3.8 cm, This abuts the major fissure 2. Moderate-sized right pleural effusion which is partially loculated tracks anteriorly the lower hemithorax. Possible occasional pleural enhancement and thickening posteriorly. 3. Rounded masslike opacity in the dependent right lower lobe measuring 5.8 x 4.1 cm 4. Additional bilateral pulmonary nodules, largest measuring 7 mm in the right upper lobe. 5. Prominent mediastinal and right hilar lymph nodes.  6   Low-density 2.4 cm right adrenal nodule. This is unchanged from 12/15/2019 abdominal CT in typical of adenoma   05/05/2022 Procedure   status post ultrasound-guided right thoracentesis.  Cytology showed suspicious for malignancy, background lymphocyte predominant effusion.    05/13/2022  Imaging  PET scan initial staging showed 1. 5.9 by 3.5 cm right hilar mass with maximum SUV 5.7, compatible with malignancy. The epicenter of this mass appears to be in the right upper lobe. Faintly increased activity in the right hilar lymph node which could be involved. 2. Large right pleural effusion with pleural thickening and subtle accentuated activity along the pleural surface without a well-defined masslike pleural lesion. Correlate with recent thoracocentesis results. 3. Low-grade activity in the collapsed right lower lobe, maximum SUV 3.2, probably inflammatory. 4. No findings of malignancy outside of the chest. 5. 2.4 cm right adrenal adenoma does not require follow up. 6. 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 Lumbar spondylosis and degenerative disc disease. 7. Aortic atherosclerosis.   05/29/2022 Initial Diagnosis   Metastatic non-small cell lung cancer ([place])  02/25/22 patient was seen by PCP and was diagnosed with bronchitis, was treated with Azithromycin.  Cough did not improve. He took levaquin for 10 days without improvement either.   05/29/2022 status post microscopy biopsy. Right upper lobe biopsy-non-small cell carcinoma, favor adenocarcinoma. Right lower lobe biopsy-nondiagnostic reactive bronchial cells Right upper lobe ENB assisted FNA, positive for malignancy.  Non-small cell carcinoma, favor adenocarcinoma. Right upper lobe lung ENB with depression-positive for malignancy, non-small cell carcinoma is present Station 10R -positive for malignancy consistent with metastatic non-small cell carcinoma involving lymphoid material. Station 11 R-FNA showed suspicious for malignancy.  Single atypical epithelial groups in a hypocellular specimen Station 7-FNA showed positive for malignancy.  Consistent with metastatic non-small cell carcinoma involving lymphoid material   05/29/2022 Cancer Staging   Staging form: Lung, AJCC 8th Edition -  Clinical stage from 05/29/2022: Stage IV (cT3, cN2, cM1) - Signed by Earlie Server, MD on 06/03/2022 Stage prefix: Initial diagnosis     Patient was accompanied by wife to discuss biopsy results.  He feels well, no shortness of breath at rest, some SOB with exertion,  He remains very active.     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    SURGICAL HISTORY: Past Surgical History:  Procedure Laterality Date   COLONOSCOPY     KNEE ARTHROSCOPY Right 2005   TONSILLECTOMY  1970   VIDEO BRONCHOSCOPY WITH ENDOBRONCHIAL ULTRASOUND N/A 05/29/2022   Procedure: VIDEO BRONCHOSCOPY WITH ENDOBRONCHIAL ULTRASOUND;  Surgeon: Ottie Glazier, MD;  Location: [facility] ORS;  Service: Thoracic;  Laterality: N/A;    SOCIAL HISTORY: Social History   Socioeconomic History   Marital status: Married    Spouse name: [name]   Number of children: 3   Years of education: Not on file   Highest education level: Not on file  Occupational History   Not on file  Tobacco Use   Smoking status: Never   Smokeless tobacco: Never  Vaping Use   Vaping Use: Never used  Substance and Sexual Activity   Alcohol use: Yes    Comment: weekly   Drug use: Not Currently   Sexual activity: Not on file  Other Topics Concern   Not on file  Social History Narrative   Not on file   Social Determinants of Health   Financial Resource Strain: Not on file  Food Insecurity: Not on file  Transportation Needs: Not on file  Physical Activity: Not on file  Stress: Not on file  Social Connections: Not on file  Intimate Partner Violence: Not  on file    FAMILY HISTORY: Family History  Problem Relation Age of Onset   Hypertension Father    Heart disease Father    Hypertension Brother    COPD Brother     ALLERGIES:  has No Known Allergies.  MEDICATIONS:  Current Outpatient Medications  Medication Sig Dispense Refill    acetaminophen (TYLENOL) 500 MG tablet Take 500 mg by mouth every 6 (six) hours as needed for mild pain.     dexamethasone (DECADRON) 2 MG tablet Take 1 tablet (2 mg total) by mouth 2 (two) times daily. 60 tablet 2   famotidine (PEPCID) 40 MG tablet Take 1 tablet by mouth at bedtime.     ibuprofen (ADVIL) 200 MG tablet Take 400 mg by mouth every 8 (eight) hours as needed.     lisinopril (ZESTRIL) 5 MG tablet Take 5 mg by mouth daily.     meloxicam (MOBIC) 15 MG tablet Take 15 mg by mouth daily as needed.     No current facility-administered medications for this visit.    Review of Systems  Constitutional:  Negative for appetite change, chills, fatigue, fever and unexpected weight change.  HENT:   Negative for hearing loss and voice change.   Eyes:  Negative for eye problems and icterus.  Respiratory:  Positive for cough. Negative for chest tightness and shortness of breath.   Cardiovascular:  Negative for chest pain and leg swelling.  Gastrointestinal:  Negative for abdominal distention and abdominal pain.  Endocrine: Negative for hot flashes.  Genitourinary:  Negative for difficulty urinating, dysuria and frequency.   Musculoskeletal:  Negative for arthralgias.  Skin:  Negative for itching and rash.  Neurological:  Negative for light-headedness and numbness.  Hematological:  Negative for adenopathy. Does not bruise/bleed easily.  Psychiatric/Behavioral:  Negative for confusion.      PHYSICAL EXAMINATION: ECOG PERFORMANCE STATUS: 0 - Asymptomatic  Vitals:   06/03/22 1326  BP: (!) 145/98  Pulse: 86  Resp: 18  Temp: (!) 96.5 F (35.8 C)  SpO2: 100%   Filed Weights   06/03/22 1326  Weight: 200 lb 11.2 oz (91 kg)    Physical Exam Constitutional:      General: He is not in acute distress.    Appearance: He is not diaphoretic.  HENT:     Head: Normocephalic and atraumatic.  Eyes:     General: No scleral icterus. Cardiovascular:     Rate and Rhythm: Normal rate.      Heart sounds: No murmur heard. Pulmonary:     Effort: Pulmonary effort is normal. No respiratory distress.     Comments: Decreased right lower lung breath sound.  Abdominal:     General: Bowel sounds are normal. There is no distension.     Palpations: Abdomen is soft.  Musculoskeletal:        General: Normal range of motion.     Cervical back: Normal range of motion.  Skin:    General: Skin is warm and dry.     Findings: No erythema.  Neurological:     Mental Status: He is alert and oriented to person, place, and time. Mental status is at baseline.     Cranial Nerves: No cranial nerve deficit.     Motor: No abnormal muscle tone.  Psychiatric:        Mood and Affect: Affect normal.      LABORATORY DATA:  I have reviewed the data as listed    Latest Ref Rng & Units 05/29/2022  6:29 AM 05/14/2022   10:51 AM  CBC  WBC 4.0 - 10.5 K/uL  7.4   Hemoglobin 13.0 - 17.0 g/dL 14.6  15.2   Hematocrit 39.0 - 52.0 % 43.0  45.3   Platelets 150 - 400 K/uL  295       Latest Ref Rng & Units 05/29/2022    6:29 AM 05/14/2022   10:51 AM  CMP  Glucose 70 - 99 mg/dL 100  102   BUN 8 - 23 mg/dL 20  15   Creatinine 0.61 - 1.24 mg/dL 1.20  1.06   Sodium 135 - 145 mmol/L 140  135   Potassium 3.5 - 5.1 mmol/L 4.6  5.1   Chloride 98 - 111 mmol/L 105  101   CO2 22 - 32 mmol/L  26   Calcium 8.9 - 10.3 mg/dL  8.6   Total Protein 6.5 - 8.1 g/dL  7.6   Total Bilirubin 0.3 - 1.2 mg/dL  0.8   Alkaline Phos 38 - 126 U/L  81   AST 15 - 41 U/L  19   ALT 0 - 44 U/L  15      RADIOGRAPHIC STUDIES: I have personally reviewed the radiological images as listed and agreed with the findings in the report. X-ray chest PA or AP  Result Date: 05/29/2022 CLINICAL DATA:  Bronchoscopy EXAM: CHEST  1 VIEW COMPARISON:  05/22/2022 FINDINGS: Stable heart size. Loculated appearing small-moderate right pleural effusion with masslike right perihilar opacity and dense right basilar consolidation. Worsening aeration within  the right lung base. No pneumothorax. Left lung is clear. IMPRESSION: 1. No pneumothorax following bronchoscopy. 2. Worsening aeration of the right lung base, atelectasis or pneumonia. Electronically Signed   By: Davina Poke D.O.   On: 05/29/2022 10:08   DG C-Arm 1-60 Min-No Report  Result Date: 05/29/2022 Fluoroscopy was utilized by the requesting physician.  No radiographic interpretation.   CT Super D Chest Wo Contrast  Result Date: 05/27/2022 CLINICAL DATA:  Lung mass with brain metastases. Preoperative/planning study. EXAM: CT CHEST WITHOUT CONTRAST TECHNIQUE: Multidetector CT imaging of the chest was performed using thin slice collimation for electromagnetic bronchoscopy planning purposes, without intravenous contrast. RADIATION DOSE REDUCTION: This exam was performed according to the departmental dose-optimization program which includes automated exposure control, adjustment of the mA and/or kV according to patient size and/or use of iterative reconstruction technique. COMPARISON:  Chest CT 04/30/2022.  PET-CT 05/13/2022. FINDINGS: Cardiovascular: The heart size is normal. No substantial pericardial effusion. Coronary artery calcification is evident. Mild atherosclerotic calcification is noted in the wall of the thoracic aorta. Mediastinum/Nodes: Small scattered mediastinal lymph nodes again noted. No evidence for gross hilar lymphadenopathy although assessment is limited by the lack of intravenous contrast on the current study. 10 mm short axis right hilar node evident on 76/2, stable the esophagus has normal imaging features. There is no axillary lymphadenopathy. Lungs/Pleura: Similar appearance of the right parahilar mass lesion measuring 6.2 cm today on 72/3 compared to 5.6 cm previously. Adjacent areas of satellite nodularity and linear segmental atelectasis scarring are similar to prior. Additional areas of masslike focal consolidative opacity are seen in the right middle and lower lobes  without substantial change since the recent CT chest with contrast. Small to moderate loculated right pleural effusion is stable. Stable 4 mm anterior left upper lobe nodule on 30/3. 4 mm left upper lobe nodule on 80/3 is stable. Tiny posterior left upper lobe nodule on 52/3 is unchanged. Upper Abdomen: 2 cm right  adrenal adenoma is stable. Visualized upper abdomen otherwise unremarkable. Musculoskeletal: No worrisome lytic or sclerotic osseous abnormality. IMPRESSION: 1. Similar appearance of the right parahilar mass lesion with adjacent areas of satellite nodularity and linear segmental atelectasis/scarring. 2. Additional areas of masslike focal consolidative opacity in the right middle and lower lobes without substantial change since the recent CT chest with contrast. 3. No substantial change small to moderate loculated right pleural effusion. 4. Stable tiny left pulmonary nodules. 5. Stable 2 cm right adrenal adenoma. 6.  Aortic Atherosclerosis (ICD10-I70.0). Electronically Signed   By: Misty Stanley M.D.   On: 05/27/2022 09:13   MR Brain W Wo Contrast  Result Date: 05/22/2022 CLINICAL DATA:  Lung mass R91.8 (ICD-10-CM). EXAM: MRI HEAD WITHOUT AND WITH CONTRAST TECHNIQUE: Multiplanar, multiecho pulse sequences of the brain and surrounding structures were obtained without and with intravenous contrast. CONTRAST:  28mL GADAVIST GADOBUTROL 1 MMOL/ML IV SOLN COMPARISON:  None Available. FINDINGS: Brain: Multiple peripherally enhancing lesions are seen scattered in the bilateral cerebral hemispheres, concerning for metastatic disease. No significant associated vasogenic edema. These lesions are annotated on series 18: 1. Posterior left temporal lobe, 5 mm, image 63. 2. Posterior medial right temporal lobe, 8 mm, image 69. 3. Left occipital lobe, 6 mm, image 72. 4. Left occipital lobe, 4 mm, image 73. 5. Posterior left temporal lobe, 7 mm, image 76. 6. Posterior left cingulate gyrus, 10 mm, image 84. 7. Splenium of  the corpus callosum on the right, 7 mm, image 90. 8. Left lentiform nucleus, 11 mm, image 93. 9. Left parietal lobe, 3 mm, image 114. 10. Left medial frontal lobe, 10 mm, image 117. 11. Left medial frontal lobe, 4 mm, image 118. 12. Left posterior frontal lobe, 3 mm, image 128. 13. Right posterior frontal lobe, 3 mm, image 141. Mild chronic microangiopathic changes of the white matter are seen. No acute infarct, hydrocephalus or extra-axial collection. Vascular: Normal flow voids. Skull and upper cervical spine: Normal marrow signal. Sinuses/Orbits: Negative. Other: None. IMPRESSION: Multiple peripherally enhancing lesions in the bilateral cerebral hemispheres, concerning for metastatic disease. Electronically Signed   By: Pedro Earls M.D.   On: 05/22/2022 17:02   US THORACENTESIS ASP PLEURAL SPACE W/IMG GUIDE  Result Date: 05/22/2022 INDICATION: Patient with a right pleural mass and recurrent malignant effusion. Interventional radiology asked to perform a diagnostic and therapeutic thoracentesis. EXAM: ULTRASOUND GUIDED THORACENTESIS MEDICATIONS: 1% lidocaine 10 mL COMPLICATIONS: None immediate. PROCEDURE: An ultrasound guided thoracentesis was thoroughly discussed with the patient and questions answered. The benefits, risks, alternatives and complications were also discussed. The patient understands and wishes to proceed with the procedure. Written consent was obtained. Ultrasound was performed to localize and mark an adequate pocket of fluid in the right chest. The area was then prepped and draped in the normal sterile fashion. 1% Lidocaine was used for local anesthesia. Under ultrasound guidance a 6 Fr Safe-T-Centesis catheter was introduced. Thoracentesis was performed. The catheter was removed and a dressing applied. FINDINGS: A total of approximately 1.3 L of amber-colored fluid was removed. Samples were sent to the laboratory as requested by the clinical team. IMPRESSION: Successful  ultrasound guided right thoracentesis yielding 1.3 L of pleural fluid. Read by: Soyla Dryer, NP Electronically Signed   By: Jacqulynn Cadet M.D.   On: 05/22/2022 10:30   DG Chest 1 View  Result Date: 05/22/2022 CLINICAL DATA:  [id number] Status post thoracentesis [id number] EXAM: CHEST  1 VIEW COMPARISON:  May 05, 2022, May 13, 2022 FINDINGS:  The cardiomediastinal silhouette is unchanged in contour.Unchanged elevation of the RIGHT hemidiaphragm. Revisualization of a mass overlying the minor fissure as well as a peripheral opacity of the RIGHT middle lobe laterally. Small residual RIGHT pleural effusion. No significant pneumothorax. No acute pleuroparenchymal abnormality. IMPRESSION: 1. Small residual RIGHT RIGHT pleural effusion status post thoracentesis. No significant pneumothorax. 2. Similar appearance of the RIGHT lung consistent with known underlying lung mass. Electronically Signed   By: Valentino Saxon M.D.   On: 05/22/2022 09:33   NM PET Image Initial (PI) Skull Base To Thigh  Result Date: 05/13/2022 CLINICAL DATA:  Subsequent treatment strategy for right upper lobe mass with pleural effusion. EXAM: NUCLEAR MEDICINE PET SKULL BASE TO THIGH TECHNIQUE: 11.1 mCi F-18 FDG was injected intravenously. Full-ring PET imaging was performed from the skull base to thigh after the radiotracer. CT data was obtained and used for attenuation correction and anatomic localization. Fasting blood glucose: 96 mg/dl COMPARISON:  CT chest 04/30/2022 FINDINGS: Mediastinal blood pool activity: SUV max 1.5 Liver activity: SUV max NA NECK: Anterior tongue activity, no CT abnormality, considered physiologic. Incidental CT findings: None CHEST: Right hilar mass extends along the fissures but its epicenter seems to be in the right upper lobe, the lesion measures 5.9 by 3.5 cm on image 89 series 2 with maximum SUV 5.7, compatible with malignancy. Large right pleural effusion with right pleural thickening and subtle  accentuated activity along the pleural surface without a well-defined masslike pleural lesion. Pleural fluid activity about 0.8, correlate with recent thoracocentesis results in assessing for pleural malignancy. There is some ill-defined low-grade activity in the collapsed right lower lobe, maximum SUV 3.2. Given the ill definition I favor this is probably being inflammatory rather than tumor, but the right lower lobe is difficult to characterize due to the complete atelectasis. Right hilar node measuring 0.7 cm in short axis on image 91 series 2 has a maximum SUV of 2.5, although low this is greater than background blood pool activity. 1.4 cm hypodense lesion of the right thyroid lobe is not substantially metabolic. Not clinically significant; no follow-up imaging recommended (ref: J Am Coll Radiol. 2015 Feb;12(2): 143-50). Incidental CT findings: Coronary, aortic arch, and branch vessel atherosclerotic vascular disease. Mild cardiomegaly. ABDOMEN/PELVIS: No significant abnormal hypermetabolic activity in this region. Incidental CT findings: 2.4 by 1.9 cm right adrenal adenoma. This does not require follow up. Atherosclerosis is present, including aortoiliac atherosclerotic disease. SKELETON: No significant abnormal hypermetabolic activity in this region. 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 Lumbar spondylosis and degenerative disc disease. IMPRESSION: 1. 5.9 by 3.5 cm right hilar mass with maximum SUV 5.7, compatible with malignancy. The epicenter of this mass appears to be in the right upper lobe. Faintly increased activity in the right hilar lymph node which could be involved. 2. Large right pleural effusion with pleural thickening and subtle accentuated activity along the pleural surface without a well-defined masslike pleural lesion. Correlate with recent thoracocentesis results. 3. Low-grade activity in the collapsed right lower lobe, maximum  SUV 3.2, probably inflammatory. 4. No findings of malignancy outside of the chest. 5. 2.4 cm right adrenal adenoma does not require follow up. 6. 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 Lumbar spondylosis and degenerative disc disease. 7. Aortic atherosclerosis. Aortic Atherosclerosis (ICD10-I70.0). Electronically Signed   By: Van Clines M.D.   On: 05/13/2022 15:14   DG Chest [facility]  Result Date: 05/05/2022 CLINICAL DATA:  67 year old male with right pleural effusion and suspected underlying lung mass by CT. Status post ultrasound-guided right thoracentesis this morning. EXAM: PORTABLE CHEST 1 VIEW COMPARISON:  Chest CT 04/30/2022. FINDINGS: Portable AP upright view at 0913 hours. Reduced volume of right pleural fluid with on going rounded and masslike roughly 5 cm mid lung opacity corresponding to the CT abnormality. No pneumothorax. Ongoing volume loss in the right hemithorax. Stable mediastinal contours. Left lung and trachea appear negative. Stable visualized osseous structures. IMPRESSION: 1. Decreased right pleural effusion following thoracentesis with no pneumothorax. 2. Continued 5 cm right mid lung mass-like and suspicious lung opacity as seen by CT. Electronically Signed   By: Genevie Ann M.D.   On: 05/05/2022 09:27   US THORACENTESIS ASP PLEURAL SPACE W/IMG GUIDE  Result Date: 05/05/2022 INDICATION: Patient recently diagnosed with pneumonia had incidental finding on CT of right upper and lower lobe mass, multiple bilateral pulmonary nodules and moderate right-sided pleural effusion. Patient was referred to IR for diagnostic and therapeutic right thoracentesis. EXAM: ULTRASOUND GUIDED DIAGNOSTIC AND THERAPEUTIC RIGHT THORACENTESIS MEDICATIONS: 10 mL 1 % lidocaine COMPLICATIONS: None immediate. PROCEDURE: An ultrasound guided thoracentesis was thoroughly discussed with the patient and questions answered. The benefits, risks, alternatives and  complications were also discussed. The patient understands and wishes to proceed with the procedure. Written consent was obtained. Ultrasound was performed to localize and mark an adequate pocket of fluid in the right chest. The area was then prepped and draped in the normal sterile fashion. 1% Lidocaine was used for local anesthesia. Under ultrasound guidance a 6 Fr Safe-T-Centesis catheter was introduced. Thoracentesis was performed. The catheter was removed and a dressing applied. FINDINGS: A total of approximately 1.55 L of clear, amber fluid was removed. Samples were sent to the laboratory as requested by the clinical team. IMPRESSION: Successful ultrasound guided RIGHT thoracentesis yielding 1.55 L of pleural fluid. Read by: Narda Rutherford, AGNP-BC Electronically Signed   By: Michaelle Birks M.D.   On: 05/05/2022 09:26

## 2022-06-03 NOTE — Progress Notes (Signed)
Met with patient during follow up visit with Dr. Tasia Catchings. All questions answered during visit. Pathology request to test for PDL1 faxed. Blood sample collected for [facility] liquid biopsy. Pt informed that will follow up with Dr. Tasia Catchings once results from liquid biopsy have come back but will tentatively see him in about 2 weeks. Nothing further needed at this time. Instructed pt to call with any questions or needs. Pt verbalized understanding.

## 2022-06-03 NOTE — Assessment & Plan Note (Signed)
Likely malignant pleural effusion.  Status post diagnostic thoracentesis.  Cytology not definitive. He is currently not symptomatic.  Asked patient to notify office if he develops symptoms.

## 2022-06-03 NOTE — Progress Notes (Signed)
START ON PATHWAY REGIMEN - Non-Small Cell Lung     A cycle is every 21 days:     Paclitaxel      Carboplatin      Bevacizumab-xxxx   **Always confirm dose/schedule in your pharmacy ordering system**  Patient Characteristics: Stage IV Metastatic, Nonsquamous, Awaiting Molecular Test Results and Need to Start Chemotherapy, PS = 0, 1 Therapeutic Status: Stage IV Metastatic Histology: Nonsquamous Cell Broad Molecular Profiling Status: Awaiting Molecular Test Results and Need to Start Chemotherapy ECOG Performance Status: 0 Intent of Therapy: Non-Curative / Palliative Intent, Discussed with Patient

## 2022-06-03 NOTE — Assessment & Plan Note (Signed)
Patient has establish care with radiation oncology for palliative radiation. He has been recommended to start on dexamethasone

## 2022-06-03 NOTE — Assessment & Plan Note (Signed)
Imaging results and pathology results were reviewed and discussed with patient.  The diagnosis of stage IV non-small cell lung cancer and care plan were discussed with patient in detail.  The goal of adjuvant chemotherapy treatment is with palliative intent  Limited quality of biopsy tissue for NGS.  I will check liquid biopsy. If patient has targetable mutation, he will start with brachytherapy. If patient does not have targetable mutations, I recommend immunotherapy +/- chemotherapy.  Pending PD-L1 status. Chemotherapy education was provided.  We had discussed the composition of chemotherapy regimen, length of chemo cycle, duration of treatment and the time to assess response to treatment.   I explained to the patient the risks and benefits of chemotherapy including all but not limited to hair loss, mouth sore, nausea, vomiting, diarrhea, low blood counts, bleeding, neuropathy, and risk of life threatening infection and even death, secondary malignancy etc.  I discussed the mechanism of action and rationale of using immunotherapy. Discussed the potential side effects of immunotherapy including but not limited to diarrhea; skin rash; respiratory failure, kidney failure, mental status change, elevated LFTs/liver failure,endocrine abnormalities, acute deterioration  and even death,etc.  patient voices understanding and agrees with above plan   # Chemotherapy education; Supportive care measures are necessary for patient well-being and will be provided as necessary.

## 2022-06-03 NOTE — Progress Notes (Signed)
Multidisciplinary Oncology Council Documentation  ASAF ELMQUIST was presented by our [facility] on 06/03/2022, which included representatives from:  Palliative Care Dietitian  Physical/Occupational Therapist Nurse Navigator Genetics Speech Therapist Social work Survivorship RN Financial Navigator Research RN   Jona currently presents with history of lung cancer  We reviewed previous medical and familial history, history of present illness, and recent lab results along with all available histopathologic and imaging studies. The [place] considered available treatment options and made the following recommendations/referrals:  Consider palliative care, nutrition  The MOC is a meeting of clinicians from various specialty areas who evaluate and discuss patients for whom a multidisciplinary approach is being considered. Final determinations in the plan of care are those of the provider(s).   Today's extended care, comprehensive team conference, Kenzo was not present for the discussion and was not examined.

## 2022-06-04 ENCOUNTER — Institutional Professional Consult (permissible substitution): Payer: Medicare Other | Admitting: Radiation Oncology

## 2022-06-04 ENCOUNTER — Ambulatory Visit
Admission: RE | Admit: 2022-06-04 | Discharge: 2022-06-04 | Disposition: A | Discharge status: Home / Self Care | Payer: Medicare Other | Source: Ambulatory Visit | Attending: Radiation Oncology | Admitting: Radiation Oncology

## 2022-06-04 ENCOUNTER — Other Ambulatory Visit: Payer: Self-pay

## 2022-06-04 ENCOUNTER — Telehealth: Payer: Self-pay

## 2022-06-04 ENCOUNTER — Inpatient Hospital Stay: Payer: Medicare Other | Admitting: Oncology

## 2022-06-04 DIAGNOSIS — Z5112 Encounter for antineoplastic immunotherapy: Secondary | ICD-10-CM | POA: Diagnosis not present

## 2022-06-04 LAB — RAD ONC ARIA SESSION SUMMARY
Course Elapsed Days: 0
Plan Fractions Treated to Date: 1
Plan Prescribed Dose Per Fraction: 3 Gy
Plan Total Fractions Prescribed: 10
Plan Total Prescribed Dose: 30 Gy
Reference Point Dosage Given to Date: 3 Gy
Reference Point Session Dosage Given: 3 Gy
Session Number: 1

## 2022-06-04 NOTE — Telephone Encounter (Signed)
[facility] liquid biopsy testing (xF) submitted via [facility] portal. Blood collected on 2/7 and was taken to FED EX box for shipping. Demographics, ins card copy and last OVN faxed to [facility].   Tracking #[id number]  Financial application was submitted and pt did not qualify. Called to inform pt and provided with a number for him to call and discuss cost/ re-submit FA application. Pt states he does not need to re-apply and verbalized understanding

## 2022-06-05 ENCOUNTER — Other Ambulatory Visit: Payer: Self-pay

## 2022-06-05 ENCOUNTER — Ambulatory Visit
Admission: RE | Admit: 2022-06-05 | Discharge: 2022-06-05 | Disposition: A | Discharge status: Home / Self Care | Payer: Medicare Other | Source: Ambulatory Visit | Attending: Radiation Oncology | Admitting: Radiation Oncology

## 2022-06-05 ENCOUNTER — Encounter: Payer: Self-pay | Admitting: Oncology

## 2022-06-05 DIAGNOSIS — Z5112 Encounter for antineoplastic immunotherapy: Secondary | ICD-10-CM | POA: Diagnosis not present

## 2022-06-05 LAB — RAD ONC ARIA SESSION SUMMARY
Course Elapsed Days: 1
Plan Fractions Treated to Date: 2
Plan Prescribed Dose Per Fraction: 3 Gy
Plan Total Fractions Prescribed: 10
Plan Total Prescribed Dose: 30 Gy
Reference Point Dosage Given to Date: 6 Gy
Reference Point Session Dosage Given: 3 Gy
Session Number: 2

## 2022-06-08 ENCOUNTER — Ambulatory Visit
Admission: RE | Admit: 2022-06-08 | Discharge: 2022-06-08 | Disposition: A | Discharge status: Home / Self Care | Payer: Medicare Other | Source: Ambulatory Visit | Attending: Radiation Oncology | Admitting: Radiation Oncology

## 2022-06-08 ENCOUNTER — Other Ambulatory Visit: Payer: Self-pay

## 2022-06-08 ENCOUNTER — Encounter: Payer: Self-pay | Admitting: Pulmonary Disease

## 2022-06-08 DIAGNOSIS — Z5112 Encounter for antineoplastic immunotherapy: Secondary | ICD-10-CM | POA: Diagnosis not present

## 2022-06-08 LAB — RAD ONC ARIA SESSION SUMMARY
Course Elapsed Days: 4
Plan Fractions Treated to Date: 3
Plan Prescribed Dose Per Fraction: 3 Gy
Plan Total Fractions Prescribed: 10
Plan Total Prescribed Dose: 30 Gy
Reference Point Dosage Given to Date: 9 Gy
Reference Point Session Dosage Given: 3 Gy
Session Number: 3

## 2022-06-09 ENCOUNTER — Ambulatory Visit
Admission: RE | Admit: 2022-06-09 | Discharge: 2022-06-09 | Disposition: A | Discharge status: Home / Self Care | Payer: Medicare Other | Source: Ambulatory Visit | Attending: Radiation Oncology | Admitting: Radiation Oncology

## 2022-06-09 ENCOUNTER — Other Ambulatory Visit: Payer: Self-pay

## 2022-06-09 ENCOUNTER — Inpatient Hospital Stay: Payer: Medicare Other

## 2022-06-09 DIAGNOSIS — Z5112 Encounter for antineoplastic immunotherapy: Secondary | ICD-10-CM | POA: Diagnosis not present

## 2022-06-09 DIAGNOSIS — C7931 Secondary malignant neoplasm of brain: Secondary | ICD-10-CM

## 2022-06-09 LAB — RAD ONC ARIA SESSION SUMMARY
Course Elapsed Days: 5
Plan Fractions Treated to Date: 4
Plan Prescribed Dose Per Fraction: 3 Gy
Plan Total Fractions Prescribed: 10
Plan Total Prescribed Dose: 30 Gy
Reference Point Dosage Given to Date: 12 Gy
Reference Point Session Dosage Given: 3 Gy
Session Number: 4

## 2022-06-09 LAB — CBC
HCT: 44.3 % (ref 39.0–52.0)
Hemoglobin: 14.9 g/dL (ref 13.0–17.0)
MCH: 30.7 pg (ref 26.0–34.0)
MCHC: 33.6 g/dL (ref 30.0–36.0)
MCV: 91.3 fL (ref 80.0–100.0)
Platelets: 287 10*3/uL (ref 150–400)
RBC: 4.85 MIL/uL (ref 4.22–5.81)
RDW: 12.5 % (ref 11.5–15.5)
WBC: 13 10*3/uL — ABNORMAL HIGH (ref 4.0–10.5)
nRBC: 0 % (ref 0.0–0.2)

## 2022-06-10 ENCOUNTER — Encounter: Payer: Self-pay | Admitting: *Deleted

## 2022-06-10 ENCOUNTER — Ambulatory Visit
Admission: RE | Admit: 2022-06-10 | Discharge: 2022-06-10 | Disposition: A | Discharge status: Home / Self Care | Payer: Medicare Other | Source: Ambulatory Visit | Attending: Radiation Oncology | Admitting: Radiation Oncology

## 2022-06-10 ENCOUNTER — Other Ambulatory Visit: Payer: Self-pay

## 2022-06-10 DIAGNOSIS — Z5112 Encounter for antineoplastic immunotherapy: Secondary | ICD-10-CM | POA: Diagnosis not present

## 2022-06-10 LAB — RAD ONC ARIA SESSION SUMMARY
Course Elapsed Days: 6
Plan Fractions Treated to Date: 5
Plan Prescribed Dose Per Fraction: 3 Gy
Plan Total Fractions Prescribed: 10
Plan Total Prescribed Dose: 30 Gy
Reference Point Dosage Given to Date: 15 Gy
Reference Point Session Dosage Given: 3 Gy
Session Number: 5

## 2022-06-11 ENCOUNTER — Ambulatory Visit
Admission: RE | Admit: 2022-06-11 | Discharge: 2022-06-11 | Disposition: A | Discharge status: Home / Self Care | Payer: Medicare Other | Source: Ambulatory Visit | Attending: Radiation Oncology | Admitting: Radiation Oncology

## 2022-06-11 ENCOUNTER — Other Ambulatory Visit: Payer: Medicare Other

## 2022-06-11 ENCOUNTER — Other Ambulatory Visit: Payer: Self-pay

## 2022-06-11 ENCOUNTER — Inpatient Hospital Stay: Payer: Medicare Other

## 2022-06-11 DIAGNOSIS — Z5112 Encounter for antineoplastic immunotherapy: Secondary | ICD-10-CM | POA: Diagnosis not present

## 2022-06-11 LAB — RAD ONC ARIA SESSION SUMMARY
Course Elapsed Days: 7
Plan Fractions Treated to Date: 6
Plan Prescribed Dose Per Fraction: 3 Gy
Plan Total Fractions Prescribed: 10
Plan Total Prescribed Dose: 30 Gy
Reference Point Dosage Given to Date: 18 Gy
Reference Point Session Dosage Given: 3 Gy
Session Number: 6

## 2022-06-12 ENCOUNTER — Ambulatory Visit
Admission: RE | Admit: 2022-06-12 | Discharge: 2022-06-12 | Disposition: A | Discharge status: Home / Self Care | Payer: Medicare Other | Source: Ambulatory Visit | Attending: Radiation Oncology | Admitting: Radiation Oncology

## 2022-06-12 ENCOUNTER — Other Ambulatory Visit: Payer: Self-pay

## 2022-06-12 DIAGNOSIS — Z5112 Encounter for antineoplastic immunotherapy: Secondary | ICD-10-CM | POA: Diagnosis not present

## 2022-06-12 LAB — RAD ONC ARIA SESSION SUMMARY
Course Elapsed Days: 8
Plan Fractions Treated to Date: 7
Plan Prescribed Dose Per Fraction: 3 Gy
Plan Total Fractions Prescribed: 10
Plan Total Prescribed Dose: 30 Gy
Reference Point Dosage Given to Date: 21 Gy
Reference Point Session Dosage Given: 3 Gy
Session Number: 7

## 2022-06-14 ENCOUNTER — Other Ambulatory Visit: Payer: Self-pay

## 2022-06-15 ENCOUNTER — Encounter: Payer: Self-pay | Admitting: Oncology

## 2022-06-15 ENCOUNTER — Other Ambulatory Visit: Payer: Self-pay | Admitting: Oncology

## 2022-06-15 ENCOUNTER — Telehealth: Payer: Self-pay

## 2022-06-15 ENCOUNTER — Ambulatory Visit
Admission: RE | Admit: 2022-06-15 | Discharge: 2022-06-15 | Disposition: A | Discharge status: Home / Self Care | Payer: Medicare Other | Source: Ambulatory Visit | Attending: Radiation Oncology | Admitting: Radiation Oncology

## 2022-06-15 ENCOUNTER — Other Ambulatory Visit: Payer: Self-pay

## 2022-06-15 DIAGNOSIS — Z5112 Encounter for antineoplastic immunotherapy: Secondary | ICD-10-CM | POA: Diagnosis not present

## 2022-06-15 DIAGNOSIS — C3490 Malignant neoplasm of unspecified part of unspecified bronchus or lung: Secondary | ICD-10-CM

## 2022-06-15 LAB — RAD ONC ARIA SESSION SUMMARY
Course Elapsed Days: 11
Plan Fractions Treated to Date: 8
Plan Prescribed Dose Per Fraction: 3 Gy
Plan Total Fractions Prescribed: 10
Plan Total Prescribed Dose: 30 Gy
Reference Point Dosage Given to Date: 24 Gy
Reference Point Session Dosage Given: 3 Gy
Session Number: 8

## 2022-06-15 MED ORDER — PROCHLORPERAZINE MALEATE 10 MG PO TABS: 10.0000 mg | ORAL_TABLET | Freq: Four times a day (QID) | ORAL | 1 refills | Status: DC | PRN

## 2022-06-15 MED ORDER — ONDANSETRON HCL 8 MG PO TABS: 8.0000 mg | ORAL_TABLET | Freq: Three times a day (TID) | ORAL | 1 refills | Status: DC | PRN

## 2022-06-15 MED ORDER — FOLIC ACID 1 MG PO TABS: 1.0000 mg | ORAL_TABLET | Freq: Every day | ORAL | 3 refills | Status: DC

## 2022-06-15 MED ORDER — DEXAMETHASONE 4 MG PO TABS: ORAL_TABLET | ORAL | 1 refills | Status: DC

## 2022-06-15 NOTE — Addendum Note (Signed)
Addended by: Telford Nab on: 06/15/2022 03:27 PM   Modules accepted: Orders

## 2022-06-15 NOTE — Telephone Encounter (Addendum)
Spoke with patient in the lobby today to inform of liquid biopsy results and recommendation to start chemotherapy treatment with Hollie Salk, Beryle Flock this coming Thursday. Reviewed new prescriptions sent into pharmacy. Informed that will let him know about appt time for Thursday once scheduled. Will follow up.   Per Dr. Tasia Catchings, pt to start chemo early next week to allow time to take folic acid and receive b12 injection. Will try to arrange port placement prior to chemo start next week while waiting. Will update pt on appts at his visit tomorrow.

## 2022-06-15 NOTE — Telephone Encounter (Signed)
Report has been sent to scan.

## 2022-06-15 NOTE — Telephone Encounter (Signed)
Dr. Tasia Catchings has entered orders in IS  -B12 injection today or tomorrow -lab/MD/ carbo/alimta/ Beryle Flock *NEW*early next week.   Per Wilson message, Angie Fava will inform pt and review treatment plan once appts have been scheduled. Hayley, will you also let him know that Dr. Tasia Catchings has sent in premeds (zofran, compazine and dex) along with Folic acid (which he can start today). Thanks

## 2022-06-16 ENCOUNTER — Inpatient Hospital Stay: Payer: Medicare Other

## 2022-06-16 ENCOUNTER — Encounter: Payer: Self-pay | Admitting: *Deleted

## 2022-06-16 ENCOUNTER — Ambulatory Visit
Admission: RE | Admit: 2022-06-16 | Discharge: 2022-06-16 | Disposition: A | Discharge status: Home / Self Care | Payer: Medicare Other | Source: Ambulatory Visit | Attending: Radiation Oncology | Admitting: Radiation Oncology

## 2022-06-16 ENCOUNTER — Other Ambulatory Visit: Payer: Self-pay

## 2022-06-16 DIAGNOSIS — C3490 Malignant neoplasm of unspecified part of unspecified bronchus or lung: Secondary | ICD-10-CM

## 2022-06-16 DIAGNOSIS — Z5112 Encounter for antineoplastic immunotherapy: Secondary | ICD-10-CM | POA: Diagnosis not present

## 2022-06-16 DIAGNOSIS — C7931 Secondary malignant neoplasm of brain: Secondary | ICD-10-CM

## 2022-06-16 LAB — RAD ONC ARIA SESSION SUMMARY
Course Elapsed Days: 12
Plan Fractions Treated to Date: 9
Plan Prescribed Dose Per Fraction: 3 Gy
Plan Total Fractions Prescribed: 10
Plan Total Prescribed Dose: 30 Gy
Reference Point Dosage Given to Date: 27 Gy
Reference Point Session Dosage Given: 3 Gy
Session Number: 9

## 2022-06-16 LAB — CBC
HCT: 42.2 % (ref 39.0–52.0)
Hemoglobin: 14.5 g/dL (ref 13.0–17.0)
MCH: 31.3 pg (ref 26.0–34.0)
MCHC: 34.4 g/dL (ref 30.0–36.0)
MCV: 91.1 fL (ref 80.0–100.0)
Platelets: 310 10*3/uL (ref 150–400)
RBC: 4.63 MIL/uL (ref 4.22–5.81)
RDW: 12.6 % (ref 11.5–15.5)
WBC: 13.3 10*3/uL — ABNORMAL HIGH (ref 4.0–10.5)
nRBC: 0 % (ref 0.0–0.2)

## 2022-06-16 MED ORDER — CYANOCOBALAMIN 1000 MCG/ML IJ SOLN
1000.0000 ug | Freq: Once | INTRAMUSCULAR | Status: AC
  Administered 2022-06-16: 1000 ug via INTRAMUSCULAR
  Filled 2022-06-16: qty 1

## 2022-06-16 MED ORDER — LIDOCAINE-PRILOCAINE 2.5-2.5 % EX CREA: 1.0000 | TOPICAL_CREAM | CUTANEOUS | 2 refills | Status: DC | PRN

## 2022-06-16 NOTE — Progress Notes (Signed)
Met with patient after radiation treatment today to review upcoming appts and prescriptions sent into pharmacy. All questions answered during visit. Pt given written instructions regarding how to take decadron taper per Dr. Baruch Gouty and to hold decadron prescription sent in by Dr. Tasia Catchings at this time. All other premeds reviewed with patient. Instructed pt to call with any questions or needs. Pt verbalized understanding. Nothing further needed at this time.

## 2022-06-17 ENCOUNTER — Other Ambulatory Visit: Payer: Self-pay

## 2022-06-17 ENCOUNTER — Ambulatory Visit
Admission: RE | Admit: 2022-06-17 | Discharge: 2022-06-17 | Disposition: A | Discharge status: Home / Self Care | Payer: Medicare Other | Source: Ambulatory Visit | Attending: Radiation Oncology | Admitting: Radiation Oncology

## 2022-06-17 DIAGNOSIS — Z5112 Encounter for antineoplastic immunotherapy: Secondary | ICD-10-CM | POA: Diagnosis not present

## 2022-06-17 LAB — RAD ONC ARIA SESSION SUMMARY
Course Elapsed Days: 13
Plan Fractions Treated to Date: 10
Plan Prescribed Dose Per Fraction: 3 Gy
Plan Total Fractions Prescribed: 10
Plan Total Prescribed Dose: 30 Gy
Reference Point Dosage Given to Date: 30 Gy
Reference Point Session Dosage Given: 3 Gy
Session Number: 10

## 2022-06-18 ENCOUNTER — Other Ambulatory Visit (HOSPITAL_COMMUNITY): Payer: Self-pay | Admitting: Student

## 2022-06-18 ENCOUNTER — Other Ambulatory Visit: Payer: Self-pay | Admitting: Radiology

## 2022-06-18 NOTE — H&P (Signed)
Chief Complaint: Patient was seen in consultation today for metastatic non-small cell lung cancer at the request of Yu,Zhou  Referring Physician(s): Yu,Zhou  Supervising Physician: Arne Cleveland  Patient Status: [facility] - Out-pt  History of Present Illness: Kurt Ewing is a 67 y.o. male followed by oncology for recent diagnosis of metastatic non-small cell lung cancer. He underwent thoracentesis 05/05/22 with cytology suspicious for malignancy. PET 05/13/22 revealed right hilar mass and large right pleural effusion. Biopsy 05/29/22 showed non-small cell carcinoma favoring adenocarcinoma. He has been referred to IR for tunneled catheter with port placement for treatment.   Pt denies fever, chills, CP, SOB, abd pain, N/V, loss of appetite or weakness.  He endorses weight loss and HA associated with radiation.  He is NPO per order.     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  Medications: Prior to Admission medications   Medication Sig Start Date End Date Taking? Authorizing Provider  acetaminophen (TYLENOL) 500 MG tablet Take 500 mg by mouth every 6 (six) hours as needed for mild pain.    [provider]  dexamethasone (DECADRON) 2 MG tablet Take 1 tablet (2 mg total) by mouth 2 (two) times daily. 05/27/22   Noreene Filbert, MD  dexamethasone (DECADRON) 4 MG tablet Take 1 tab 2 times daily starting day before pemetrexed. Then take 2 tabs daily x 3 days starting day after carboplatin. Take with food.  06/15/22   Earlie Server, MD  famotidine (PEPCID) 40 MG tablet Take 1 tablet by mouth at bedtime. 05/12/22   [provider]  folic acid (FOLVITE) 1 MG tablet Take 1 tablet (1 mg total) by mouth daily. Start 7 days before pemetrexed chemotherapy. Continue until 21 days after pemetrexed completed. 06/15/22   Earlie Server, MD  lidocaine-prilocaine (EMLA) cream Apply 1 Application topically as needed. 06/16/22   Earlie Server, MD  lisinopril (ZESTRIL) 5 MG tablet Take 5 mg by mouth daily.    [provider]  meloxicam (MOBIC) 15 MG tablet Take 15 mg by mouth daily as needed.    [provider]  ondansetron (ZOFRAN) 8 MG tablet Take 1 tablet (8 mg total) by mouth every 8 (eight) hours as needed for nausea or vomiting. Start on the third day after carboplatin. 06/15/22   Earlie Server, MD  prochlorperazine (COMPAZINE) 10 MG tablet Take 1 tablet (10 mg total) by mouth every 6 (six) hours as needed for nausea or vomiting. 06/15/22   Earlie Server, MD     Family History  Problem Relation Age of Onset   Hypertension Father    Heart disease Father    Hypertension Brother    COPD Brother     Social History   Socioeconomic History   Marital status: Married    Spouse name: [name]   Number of children: 3   Years of education: Not on file   Highest education level: Not on file  Occupational History   Not on file  Tobacco Use   Smoking  status: Never   Smokeless tobacco: Never  Vaping Use   Vaping Use: Never used  Substance and Sexual Activity   Alcohol use: Yes    Comment: weekly   Drug use: Not Currently   Sexual activity: Not on file  Other Topics Concern   Not on file  Social History Narrative   Not on file   Social Determinants of Health   Financial Resource Strain: Not on file  Food Insecurity: Not on file  Transportation Needs: Not on file  Physical Activity: Not on file  Stress: Not on file  Social Connections: Not on file     Review of Systems: A 12 point ROS discussed and  pertinent positives are indicated in the HPI above.  All other systems are negative.  Review of Systems  Constitutional:  Positive for unexpected weight change. Negative for appetite change, chills and fever.  Respiratory:  Negative for shortness of breath.   Cardiovascular:  Negative for chest pain and leg swelling.  Gastrointestinal:  Negative for abdominal pain, nausea and vomiting.  Neurological:  Positive for headaches. Negative for dizziness and weakness.    Vital Signs: There were no vitals taken for this visit.    Physical Exam Vitals reviewed.  Constitutional:      General: He is not in acute distress.    Appearance: Normal appearance. He is normal weight. He is not ill-appearing.  HENT:     Head: Normocephalic and atraumatic.     Mouth/Throat:     Mouth: Mucous membranes are dry.     Pharynx: Oropharynx is clear.  Eyes:     Extraocular Movements: Extraocular movements intact.  Cardiovascular:     Rate and Rhythm: Regular rhythm. Bradycardia present.     Pulses: Normal pulses.     Heart sounds: Normal heart sounds. No murmur heard. Pulmonary:     Effort: Pulmonary effort is normal. No respiratory distress.     Breath sounds: Normal breath sounds.  Abdominal:     General: Bowel sounds are normal. There is no distension.     Tenderness: There is no abdominal tenderness. There is no guarding.  Musculoskeletal:     Right lower leg: No edema.     Left lower leg: No edema.  Skin:    General: Skin is warm and dry.  Neurological:     Mental Status: He is alert and oriented to person, place, and time.  Psychiatric:        Mood and Affect: Mood normal.        Behavior: Behavior normal.        Thought Content: Thought content normal.        Judgment: Judgment normal.     Imaging: X-ray chest PA or AP  Result Date: 05/29/2022 CLINICAL DATA:  Bronchoscopy EXAM: CHEST  1 VIEW COMPARISON:  05/22/2022 FINDINGS: Stable heart size. Loculated appearing small-moderate right  pleural effusion with masslike right perihilar opacity and dense right basilar consolidation. Worsening aeration within the right lung base. No pneumothorax. Left lung is clear. IMPRESSION: 1. No pneumothorax following bronchoscopy. 2. Worsening aeration of the right lung base, atelectasis or pneumonia. Electronically Signed   By: Davina Poke D.O.   On: 05/29/2022 10:08   DG C-Arm 1-60 Min-No Report  Result Date: 05/29/2022 Fluoroscopy was utilized by the requesting physician.  No radiographic interpretation.   CT Super D Chest Wo Contrast  Result Date: 05/27/2022 CLINICAL DATA:  Lung mass with brain metastases. Preoperative/planning study. EXAM: CT CHEST WITHOUT  CONTRAST TECHNIQUE: Multidetector CT imaging of the chest was performed using thin slice collimation for electromagnetic bronchoscopy planning purposes, without intravenous contrast. RADIATION DOSE REDUCTION: This exam was performed according to the departmental dose-optimization program which includes automated exposure control, adjustment of the mA and/or kV according to patient size and/or use of iterative reconstruction technique. COMPARISON:  Chest CT 04/30/2022.  PET-CT 05/13/2022. FINDINGS: Cardiovascular: The heart size is normal. No substantial pericardial effusion. Coronary artery calcification is evident. Mild atherosclerotic calcification is noted in the wall of the thoracic aorta. Mediastinum/Nodes: Small scattered mediastinal lymph nodes again noted. No evidence for gross hilar lymphadenopathy although assessment is limited by the lack of intravenous contrast on the current study. 10 mm short axis right hilar node evident on 76/2, stable the esophagus has normal imaging features. There is no axillary lymphadenopathy. Lungs/Pleura: Similar appearance of the right parahilar mass lesion measuring 6.2 cm today on 72/3 compared to 5.6 cm previously. Adjacent areas of satellite nodularity and linear segmental atelectasis scarring are  similar to prior. Additional areas of masslike focal consolidative opacity are seen in the right middle and lower lobes without substantial change since the recent CT chest with contrast. Small to moderate loculated right pleural effusion is stable. Stable 4 mm anterior left upper lobe nodule on 30/3. 4 mm left upper lobe nodule on 80/3 is stable. Tiny posterior left upper lobe nodule on 52/3 is unchanged. Upper Abdomen: 2 cm right adrenal adenoma is stable. Visualized upper abdomen otherwise unremarkable. Musculoskeletal: No worrisome lytic or sclerotic osseous abnormality. IMPRESSION: 1. Similar appearance of the right parahilar mass lesion with adjacent areas of satellite nodularity and linear segmental atelectasis/scarring. 2. Additional areas of masslike focal consolidative opacity in the right middle and lower lobes without substantial change since the recent CT chest with contrast. 3. No substantial change small to moderate loculated right pleural effusion. 4. Stable tiny left pulmonary nodules. 5. Stable 2 cm right adrenal adenoma. 6.  Aortic Atherosclerosis (ICD10-I70.0). Electronically Signed   By: Misty Stanley M.D.   On: 05/27/2022 09:13   MR Brain W Wo Contrast  Result Date: 05/22/2022 CLINICAL DATA:  Lung mass R91.8 (ICD-10-CM). EXAM: MRI HEAD WITHOUT AND WITH CONTRAST TECHNIQUE: Multiplanar, multiecho pulse sequences of the brain and surrounding structures were obtained without and with intravenous contrast. CONTRAST:  32m GADAVIST GADOBUTROL 1 MMOL/ML IV SOLN COMPARISON:  None Available. FINDINGS: Brain: Multiple peripherally enhancing lesions are seen scattered in the bilateral cerebral hemispheres, concerning for metastatic disease. No significant associated vasogenic edema. These lesions are annotated on series 18: 1. Posterior left temporal lobe, 5 mm, image 63. 2. Posterior medial right temporal lobe, 8 mm, image 69. 3. Left occipital lobe, 6 mm, image 72. 4. Left occipital lobe, 4 mm, image  73. 5. Posterior left temporal lobe, 7 mm, image 76. 6. Posterior left cingulate gyrus, 10 mm, image 84. 7. Splenium of the corpus callosum on the right, 7 mm, image 90. 8. Left lentiform nucleus, 11 mm, image 93. 9. Left parietal lobe, 3 mm, image 114. 10. Left medial frontal lobe, 10 mm, image 117. 11. Left medial frontal lobe, 4 mm, image 118. 12. Left posterior frontal lobe, 3 mm, image 128. 13. Right posterior frontal lobe, 3 mm, image 141. Mild chronic microangiopathic changes of the white matter are seen. No acute infarct, hydrocephalus or extra-axial collection. Vascular: Normal flow voids. Skull and upper cervical spine: Normal marrow signal. Sinuses/Orbits: Negative. Other: None. IMPRESSION: Multiple peripherally enhancing lesions in the bilateral cerebral hemispheres,  concerning for metastatic disease. Electronically Signed   By: Pedro Earls M.D.   On: 05/22/2022 17:02   US THORACENTESIS ASP PLEURAL SPACE W/IMG GUIDE  Result Date: 05/22/2022 INDICATION: Patient with a right pleural mass and recurrent malignant effusion. Interventional radiology asked to perform a diagnostic and therapeutic thoracentesis. EXAM: ULTRASOUND GUIDED THORACENTESIS MEDICATIONS: 1% lidocaine 10 mL COMPLICATIONS: None immediate. PROCEDURE: An ultrasound guided thoracentesis was thoroughly discussed with the patient and questions answered. The benefits, risks, alternatives and complications were also discussed. The patient understands and wishes to proceed with the procedure. Written consent was obtained. Ultrasound was performed to localize and mark an adequate pocket of fluid in the right chest. The area was then prepped and draped in the normal sterile fashion. 1% Lidocaine was used for local anesthesia. Under ultrasound guidance a 6 Fr Safe-T-Centesis catheter was introduced. Thoracentesis was performed. The catheter was removed and a dressing applied. FINDINGS: A total of approximately 1.3 L of  amber-colored fluid was removed. Samples were sent to the laboratory as requested by the clinical team. IMPRESSION: Successful ultrasound guided right thoracentesis yielding 1.3 L of pleural fluid. Read by: Soyla Dryer, NP Electronically Signed   By: Jacqulynn Cadet M.D.   On: 05/22/2022 10:30   DG Chest 1 View  Result Date: 05/22/2022 CLINICAL DATA:  [id number] Status post thoracentesis [id number] EXAM: CHEST  1 VIEW COMPARISON:  May 05, 2022, May 13, 2022 FINDINGS: The cardiomediastinal silhouette is unchanged in contour.Unchanged elevation of the RIGHT hemidiaphragm. Revisualization of a mass overlying the minor fissure as well as a peripheral opacity of the RIGHT middle lobe laterally. Small residual RIGHT pleural effusion. No significant pneumothorax. No acute pleuroparenchymal abnormality. IMPRESSION: 1. Small residual RIGHT RIGHT pleural effusion status post thoracentesis. No significant pneumothorax. 2. Similar appearance of the RIGHT lung consistent with known underlying lung mass. Electronically Signed   By: Valentino Saxon M.D.   On: 05/22/2022 09:33    Labs:  CBC: Recent Labs    05/14/22 1051 05/29/22 0629 06/09/22 1339 06/16/22 1325  WBC 7.4  --  13.0* 13.3*  HGB 15.2 14.6 14.9 14.5  HCT 45.3 43.0 44.3 42.2  PLT 295  --  287 310    COAGS: Recent Labs    05/27/22 0834  INR 1.0  APTT 33    BMP: Recent Labs    05/14/22 1051 05/29/22 0629  NA 135 140  K 5.1 4.6  CL 101 105  CO2 26  --   GLUCOSE 102* 100*  BUN 15 20  CALCIUM 8.6*  --   CREATININE 1.06 1.20  GFRNONAA >60  --     LIVER FUNCTION TESTS: Recent Labs    05/14/22 1051  BILITOT 0.8  AST 19  ALT 15  ALKPHOS 81  PROT 7.6  ALBUMIN 3.6    TUMOR MARKERS: No results for input(s): "AFPTM", "CEA", "CA199", "CHROMGRNA" in the last 8760 hours.  Assessment and Plan:  67 yo male with PMHx of GERD and HTN presents to IR for tunneled catheter with port placement for recent diagnosis of  non-small cell lung cancer.   Pt resting on stretcher.  He is A&O, calm and pleasant.  He is in no distress.   Risks and benefits of image guided tunneled catheter with port placement with moderate sedation was discussed with the patient including, but not limited to bleeding, infection, pneumothorax, or fibrin sheath development and need for additional procedures.  All of the patient's questions were answered, patient is agreeable  to proceed. Consent signed and in chart.  Thank you for this interesting consult.  I greatly enjoyed meeting GOLDEN MULDERIG and look forward to participating in their care.  A copy of this report was sent to the requesting provider on this date.  Electronically Signed: Tyson Alias, NP 06/18/2022, 4:17 PM   I spent a total of 20 minutes in face to face in clinical consultation, greater than 50% of which was counseling/coordinating care for metastatic non-small cell lung cancer.

## 2022-06-18 NOTE — Progress Notes (Signed)
Spoke with patient 06/18/22 @ 10:30am. Kurt Ewing over pre-procedure instructions for port placement on 06/19/22. Advised patient to be NPO after midnight on 06/18/22, to arrive at 12:30 for 1:30 procedure and to have driver post-procedure. Patient verbalized understanding.

## 2022-06-19 ENCOUNTER — Ambulatory Visit
Admission: RE | Admit: 2022-06-19 | Discharge: 2022-06-19 | Disposition: A | Discharge status: Home / Self Care | Payer: Medicare Other | Source: Ambulatory Visit | Attending: Oncology | Admitting: Oncology

## 2022-06-19 ENCOUNTER — Encounter: Payer: Self-pay | Admitting: Radiology

## 2022-06-19 ENCOUNTER — Other Ambulatory Visit: Payer: Self-pay

## 2022-06-19 DIAGNOSIS — K219 Gastro-esophageal reflux disease without esophagitis: Secondary | ICD-10-CM | POA: Diagnosis not present

## 2022-06-19 DIAGNOSIS — C3490 Malignant neoplasm of unspecified part of unspecified bronchus or lung: Secondary | ICD-10-CM | POA: Insufficient documentation

## 2022-06-19 DIAGNOSIS — I10 Essential (primary) hypertension: Secondary | ICD-10-CM | POA: Diagnosis not present

## 2022-06-19 HISTORY — PX: IR IMAGING GUIDED PORT INSERTION: IMG5740

## 2022-06-19 LAB — CULTURE, FUNGUS WITHOUT SMEAR

## 2022-06-19 MED ORDER — SODIUM CHLORIDE 0.9 % IV SOLN: INTRAVENOUS | Status: DC

## 2022-06-19 MED ORDER — MIDAZOLAM HCL 2 MG/2ML IJ SOLN
INTRAMUSCULAR | Status: AC
  Filled 2022-06-19: qty 2

## 2022-06-19 MED ORDER — FENTANYL CITRATE (PF) 100 MCG/2ML IJ SOLN
INTRAMUSCULAR | Status: AC | PRN
  Administered 2022-06-19 (×2): 50 ug via INTRAVENOUS

## 2022-06-19 MED ORDER — HEPARIN SOD (PORK) LOCK FLUSH 100 UNIT/ML IV SOLN
INTRAVENOUS | Status: AC
  Administered 2022-06-19: 500 [IU]
  Filled 2022-06-19: qty 5

## 2022-06-19 MED ORDER — MIDAZOLAM HCL 2 MG/2ML IJ SOLN
INTRAMUSCULAR | Status: AC | PRN
  Administered 2022-06-19 (×2): 1 mg via INTRAVENOUS

## 2022-06-19 MED ORDER — FENTANYL CITRATE (PF) 100 MCG/2ML IJ SOLN
INTRAMUSCULAR | Status: AC
  Filled 2022-06-19: qty 2

## 2022-06-19 MED ORDER — LIDOCAINE-EPINEPHRINE 1 %-1:100000 IJ SOLN
INTRAMUSCULAR | Status: AC
  Filled 2022-06-19: qty 1

## 2022-06-19 MED FILL — Fosaprepitant Dimeglumine For IV Infusion 150 MG (Base Eq): INTRAVENOUS | Qty: 5 | Status: AC

## 2022-06-19 MED FILL — Dexamethasone Sodium Phosphate Inj 100 MG/10ML: INTRAMUSCULAR | Qty: 1 | Status: AC

## 2022-06-19 NOTE — Discharge Instructions (Signed)
Implanted Port Home Guide  An implanted port is a type of central line that is placed under the skin. Central lines are used to provide IV access when treatment or nutrition needs to be given through a person's veins. Implanted ports are used for long-term IV access. An implanted port may be placed because: You need IV medicine that would be irritating to the small veins in your hands or arms. You need long-term IV medicines, such as antibiotics. You need IV nutrition for a long period. You need frequent blood draws for lab tests. You need dialysis.   Implanted ports are usually placed in the chest area, but they can also be placed in the upper arm, the abdomen, or the leg. An implanted port has two main parts: Reservoir. The reservoir is round and will appear as a small, raised area under your skin. The reservoir is the part where a needle is inserted to give medicines or draw blood. Catheter. The catheter is a thin, flexible tube that extends from the reservoir. The catheter is placed into a large vein. Medicine that is inserted into the reservoir goes into the catheter and then into the vein.   How will I care for my incision  You may shower tomorrow  How is my port accessed? Special steps must be taken to access the port: Before the port is accessed, a numbing cream can be placed on the skin. This helps numb the skin over the port site. Your health care provider uses a sterile technique to access the port. Your health care provider must put on a mask and sterile gloves. The skin over your port is cleaned carefully with an antiseptic and allowed to dry. The port is gently pinched between sterile gloves, and a needle is inserted into the port. Only "non-coring" port needles should be used to access the port. Once the port is accessed, a blood return should be checked. This helps ensure that the port is in the vein and is not clogged. If your port needs to remain accessed for a constant  infusion, a clear (transparent) bandage will be placed over the needle site. The bandage and needle will need to be changed every week, or as directed by your health care provider.   What is flushing? Flushing helps keep the port from getting clogged. Follow your health care provider's instructions on how and when to flush the port. Ports are usually flushed with saline solution or a medicine called heparin. The need for flushing will depend on how the port is used. If the port is used for intermittent medicines or blood draws, the port will need to be flushed: After medicines have been given. After blood has been drawn. As part of routine maintenance. If a constant infusion is running, the port may not need to be flushed.   How long will my port stay implanted? The port can stay in for as long as your health care provider thinks it is needed. When it is time for the port to come out, surgery will be done to remove it. The procedure is similar to the one performed when the port was put in. When should I seek immediate medical care? When you have an implanted port, you should seek immediate medical care if: You notice a bad smell coming from the incision site. You have swelling, redness, or drainage at the incision site. You have more swelling or pain at the port site or the surrounding area. You have a fever that   is not controlled with medicine.   This information is not intended to replace advice given to you by your health care provider. Make sure you discuss any questions you have with your health care provider. Document Released: 04/13/2005 Document Revised: 09/19/2015 Document Reviewed: 12/19/2012 Elsevier Interactive Patient Education  2017 [employer]    

## 2022-06-19 NOTE — Procedures (Signed)
  Procedure:  R chest port catheter placement   Preprocedure diagnosis: The encounter diagnosis was Metastatic non-small cell lung cancer ([facility]). Postprocedure diagnosis: same EBL:    minimal Complications:   none immediate  See full dictation in [facility].  Dillard Cannon MD Main # [phone] Pager  [phone] Mobile [phone]

## 2022-06-22 ENCOUNTER — Encounter: Payer: Self-pay | Admitting: Oncology

## 2022-06-22 ENCOUNTER — Inpatient Hospital Stay: Payer: Medicare Other

## 2022-06-22 ENCOUNTER — Encounter: Payer: Self-pay | Admitting: *Deleted

## 2022-06-22 ENCOUNTER — Inpatient Hospital Stay (HOSPITAL_BASED_OUTPATIENT_CLINIC_OR_DEPARTMENT_OTHER): Payer: Medicare Other | Admitting: Oncology

## 2022-06-22 VITALS — BP 138/82 | HR 65 | Temp 97.7°F | Resp 18 | Wt 197.7 lb

## 2022-06-22 DIAGNOSIS — C7931 Secondary malignant neoplasm of brain: Secondary | ICD-10-CM | POA: Diagnosis not present

## 2022-06-22 DIAGNOSIS — C3490 Malignant neoplasm of unspecified part of unspecified bronchus or lung: Secondary | ICD-10-CM | POA: Diagnosis not present

## 2022-06-22 DIAGNOSIS — Z5111 Encounter for antineoplastic chemotherapy: Secondary | ICD-10-CM

## 2022-06-22 DIAGNOSIS — Z5112 Encounter for antineoplastic immunotherapy: Secondary | ICD-10-CM | POA: Diagnosis not present

## 2022-06-22 LAB — FUNGUS CULTURE WITH STAIN

## 2022-06-22 LAB — COMPREHENSIVE METABOLIC PANEL
ALT: 21 U/L (ref 0–44)
AST: 18 U/L (ref 15–41)
Albumin: 3.4 g/dL — ABNORMAL LOW (ref 3.5–5.0)
Alkaline Phosphatase: 62 U/L (ref 38–126)
Anion gap: 10 (ref 5–15)
BUN: 29 mg/dL — ABNORMAL HIGH (ref 8–23)
CO2: 23 mmol/L (ref 22–32)
Calcium: 8.7 mg/dL — ABNORMAL LOW (ref 8.9–10.3)
Chloride: 103 mmol/L (ref 98–111)
Creatinine, Ser: 0.99 mg/dL (ref 0.61–1.24)
GFR, Estimated: >60 mL/min (ref >60)
Glucose, Bld: 124 mg/dL — ABNORMAL HIGH (ref 70–99)
Potassium: 4.5 mmol/L (ref 3.5–5.1)
Sodium: 136 mmol/L (ref 135–145)
Total Bilirubin: 1 mg/dL (ref 0.3–1.2)
Total Protein: 7 g/dL (ref 6.5–8.1)

## 2022-06-22 LAB — FUNGUS CULTURE RESULT

## 2022-06-22 LAB — CBC WITH DIFFERENTIAL/PLATELET
Abs Immature Granulocytes: 0.1 10*3/uL — ABNORMAL HIGH (ref 0.00–0.07)
Basophils Absolute: 0 10*3/uL (ref 0.0–0.1)
Basophils Relative: 0 %
Eosinophils Absolute: 0 10*3/uL (ref 0.0–0.5)
Eosinophils Relative: 0 %
HCT: 43.4 % (ref 39.0–52.0)
Hemoglobin: 14.5 g/dL (ref 13.0–17.0)
Immature Granulocytes: 1 %
Lymphocytes Relative: 5 %
Lymphs Abs: 0.6 10*3/uL — ABNORMAL LOW (ref 0.7–4.0)
MCH: 30.9 pg (ref 26.0–34.0)
MCHC: 33.4 g/dL (ref 30.0–36.0)
MCV: 92.5 fL (ref 80.0–100.0)
Monocytes Absolute: 0.6 10*3/uL (ref 0.1–1.0)
Monocytes Relative: 5 %
Neutro Abs: 11.5 10*3/uL — ABNORMAL HIGH (ref 1.7–7.7)
Neutrophils Relative %: 89 %
Platelets: 265 10*3/uL (ref 150–400)
RBC: 4.69 MIL/uL (ref 4.22–5.81)
RDW: 13.3 % (ref 11.5–15.5)
WBC: 12.9 10*3/uL — ABNORMAL HIGH (ref 4.0–10.5)
nRBC: 0 % (ref 0.0–0.2)

## 2022-06-22 LAB — FUNGAL ORGANISM REFLEX

## 2022-06-22 LAB — TSH: TSH: 1.249 u[IU]/mL (ref 0.350–4.500)

## 2022-06-22 MED ORDER — SODIUM CHLORIDE 0.9 % IV SOLN
150.0000 mg | Freq: Once | INTRAVENOUS | Status: AC
  Administered 2022-06-22: 150 mg via INTRAVENOUS
  Filled 2022-06-22: qty 150

## 2022-06-22 MED ORDER — PALONOSETRON HCL INJECTION 0.25 MG/5ML
0.2500 mg | Freq: Once | INTRAVENOUS | Status: AC
  Administered 2022-06-22: 0.25 mg via INTRAVENOUS
  Filled 2022-06-22: qty 5

## 2022-06-22 MED ORDER — SODIUM CHLORIDE 0.9 % IV SOLN
10.0000 mg | Freq: Once | INTRAVENOUS | Status: AC
  Administered 2022-06-22: 10 mg via INTRAVENOUS
  Filled 2022-06-22: qty 10

## 2022-06-22 MED ORDER — SODIUM CHLORIDE 0.9 % IV SOLN
580.0000 mg | Freq: Once | INTRAVENOUS | Status: AC
  Administered 2022-06-22: 600 mg via INTRAVENOUS
  Filled 2022-06-22: qty 60

## 2022-06-22 MED ORDER — SODIUM CHLORIDE 0.9 % IV SOLN
Freq: Once | INTRAVENOUS | Status: AC
  Filled 2022-06-22: qty 250

## 2022-06-22 MED ORDER — HEPARIN SOD (PORK) LOCK FLUSH 100 UNIT/ML IV SOLN
500.0000 [IU] | Freq: Once | INTRAVENOUS | Status: DC | PRN
  Filled 2022-06-22: qty 5

## 2022-06-22 MED ORDER — SODIUM CHLORIDE 0.9 % IV SOLN
500.0000 mg/m2 | Freq: Once | INTRAVENOUS | Status: AC
  Administered 2022-06-22: 1100 mg via INTRAVENOUS
  Filled 2022-06-22: qty 44

## 2022-06-22 MED ORDER — SODIUM CHLORIDE 0.9 % IV SOLN
200.0000 mg | Freq: Once | INTRAVENOUS | Status: AC
  Administered 2022-06-22: 200 mg via INTRAVENOUS
  Filled 2022-06-22: qty 8

## 2022-06-22 NOTE — Progress Notes (Signed)
Pt here for follow up. Pt reports hair loss and dry skin.

## 2022-06-22 NOTE — Assessment & Plan Note (Addendum)
Imaging results and pathology results were reviewed and discussed with patient.  The diagnosis of stage IV non-small cell lung cancer and care plan were discussed with patient in detail.  The goal of adjuvant chemotherapy treatment is with palliative intent  Limited quality of biopsy tissue for NGS and PDL-1 IHC.  Liquid biopsy showed no targetable mutation PD-L1  I recommend carboplatin Alimta and Keytrua Q3 weeks.  Rationale and potential side effects were reviewed with patient and daughter.  Patient plans to get a second opinion at [facility].

## 2022-06-22 NOTE — Assessment & Plan Note (Signed)
Treatment plan as discussed above.

## 2022-06-22 NOTE — Assessment & Plan Note (Addendum)
Discussed with patient an family .

## 2022-06-22 NOTE — Patient Instructions (Signed)
[facility]  Discharge Instructions: Thank you for choosing [facility] to provide your oncology and hematology care.  If you have a lab appointment with the [facility], please go directly to the [facility] and check in at the registration area.  Wear comfortable clothing and clothing appropriate for easy access to any Portacath or PICC line.   We strive to give you quality time with your provider. You may need to reschedule your appointment if you arrive late (15 or more minutes).  Arriving late affects you and other patients whose appointments are after yours.  Also, if you miss three or more appointments without notifying the office, you may be dismissed from the clinic at the provider's discretion.      For prescription refill requests, have your pharmacy contact our office and allow 72 hours for refills to be completed.    Today you received the following chemotherapy and/or immunotherapy agents Keytruda, Alimta, Paraplatin      To help prevent nausea and vomiting after your treatment, we encourage you to take your nausea medication as directed.  BELOW ARE SYMPTOMS THAT SHOULD BE REPORTED IMMEDIATELY: *FEVER GREATER THAN 100.4 F (38 C) OR HIGHER *CHILLS OR SWEATING *NAUSEA AND VOMITING THAT IS NOT CONTROLLED WITH YOUR NAUSEA MEDICATION *UNUSUAL SHORTNESS OF BREATH *UNUSUAL BRUISING OR BLEEDING *URINARY PROBLEMS (pain or burning when urinating, or frequent urination) *BOWEL PROBLEMS (unusual diarrhea, constipation, pain near the anus) TENDERNESS IN MOUTH AND THROAT WITH OR WITHOUT PRESENCE OF ULCERS (sore throat, sores in mouth, or a toothache) UNUSUAL RASH, SWELLING OR PAIN  UNUSUAL VAGINAL DISCHARGE OR ITCHING   Items with * indicate a potential emergency and should be followed up as soon as possible or go to the Emergency Department if any problems should occur.  Please show the CHEMOTHERAPY ALERT CARD or IMMUNOTHERAPY ALERT  CARD at check-in to the Emergency Department and triage nurse.  Should you have questions after your visit or need to cancel or reschedule your appointment, please contact [facility]  [phone] and follow the prompts.  Office hours are 8:00 a.m. to 4:30 p.m. Monday - Friday. Please note that voicemails left after 4:00 p.m. may not be returned until the following business day.  We are closed weekends and major holidays. You have access to a nurse at all times for urgent questions. Please call the main number to the clinic [phone] and follow the prompts.  For any non-urgent questions, you may also contact your provider using MyChart. We now offer e-Visits for anyone 56 and older to request care online for non-urgent symptoms. For details visit [email or web address].   Also download the MyChart app! Go to the app store, search "MyChart", open the app, select [facility], and log in with your MyChart username and password.

## 2022-06-22 NOTE — Progress Notes (Signed)
Met pt during follow up visit with Dr. Tasia Catchings to start chemotherapy treatment today. All questions answered during visit. Assisted pt in facilitating second opinion at [facility] in [place]. Reached out to clinic to see what they needed prior to pt's appt. All requested clinicals faxed to [facility] at [phone]. Pt was given a radiology disc and instructed to take with him to his appt. Nothing further needed at this time. Instructed pt to call with any questions or needs.

## 2022-06-22 NOTE — Progress Notes (Signed)
Hematology/Oncology Consult Note [phone] [phone]    CHIEF COMPLAINTS/PURPOSE OF CONSULTATION:  Metastatic non-small cell lung cancer, brain metastasis.   ASSESSMENT & PLAN:   Cancer Staging  Metastatic non-small cell lung cancer [facility] Staging form: Lung, AJCC 8th Edition - Clinical stage from 05/29/2022: Stage IV (cT3, cN2, cM1) - Signed by Earlie Server, MD on 06/03/2022    Metastatic non-small cell lung cancer [facility]) Imaging results and pathology results were reviewed and discussed with patient.  The diagnosis of stage IV non-small cell lung cancer and care plan were discussed with patient in detail.  The goal of adjuvant chemotherapy treatment is with palliative intent  Limited quality of biopsy tissue for NGS.  Liquid biopsy showed no targetable mutation PD-L1  I recommend carboplatin Alimta and Keytrua Q3 weeks.  Rationale and potential side effects were reviewed with patient and daughter.  Patient plans to get a second opinion at [facility].     Metastasis to brain [facility]) S/p  palliative radiation. He is on dexamethasone, defer tapering course instruction to [place].   Goals of care, counseling/discussion Discussed with patient an family .  Encounter for antineoplastic chemotherapy Treatment plan as discussed above.    Orders Placed This Encounter  Procedures   CBC with Differential ([place] Only)    Standing Status:   Future    Standing Expiration Date:   06/23/2023   CMP ([place] only)    Standing Status:   Future    Standing Expiration Date:   06/23/2023    Follow-up  7-10 days Lab MD 3 weeks lab MD chemotherapy All questions were answered. The patient knows to call the clinic with any problems, questions or concerns.  Earlie Server, MD, PhD [facility] Hematology Oncology 06/22/2022   HISTORY OF PRESENTING ILLNESS:  [place] NELLI 67 y.o. male presents to establish care for metastatic lung cancer.  Patient is a lifelong  never smoker.  Oncology History  Metastatic non-small cell lung cancer ([place])  04/30/2022 Imaging   CT chest w contrast showed  1. Perihilar right upper lobe masslike opacity measuring 3.4 x 5.6 x 3.8 cm, This abuts the major fissure 2. Moderate-sized right pleural effusion which is partially loculated tracks anteriorly the lower hemithorax. Possible occasional pleural enhancement and thickening posteriorly. 3. Rounded masslike opacity in the dependent right lower lobe measuring 5.8 x 4.1 cm 4. Additional bilateral pulmonary nodules, largest measuring 7 mm in the right upper lobe. 5. Prominent mediastinal and right hilar lymph nodes.  6   Low-density 2.4 cm right adrenal nodule. This is unchanged from 12/15/2019 abdominal CT in typical of adenoma   05/05/2022 Procedure   status post ultrasound-guided right thoracentesis.  Cytology showed suspicious for malignancy, background lymphocyte predominant effusion.    05/13/2022 Imaging   PET scan initial staging showed 1. 5.9 by 3.5 cm right hilar mass with maximum SUV 5.7, compatible with malignancy. The epicenter of this mass appears to be in the right upper lobe. Faintly increased activity in the right hilar lymph node which could be involved. 2. Large right pleural effusion with pleural thickening and subtle accentuated activity along the pleural surface without a well-defined masslike pleural lesion. Correlate with recent thoracocentesis results. 3. Low-grade activity in the collapsed right lower lobe, maximum SUV 3.2, probably inflammatory. 4. No findings of malignancy outside of the chest. 5. 2.4 cm right adrenal adenoma does not require follow up. 6. 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  Lumbar spondylosis and degenerative disc disease. 7. Aortic atherosclerosis.   05/21/2022 Imaging   MRI brain with and without contrast showed multiple peripherally enhancing lesions in the right middle cerebral  hemispheres, concerning for metastatic disease.   05/29/2022 Initial Diagnosis   Metastatic non-small cell lung cancer ([place])  02/25/22 patient was seen by PCP and was diagnosed with bronchitis, was treated with Azithromycin.  Cough did not improve. He took levaquin for 10 days without improvement either.   05/29/2022 status post microscopy biopsy. Right upper lobe biopsy-non-small cell carcinoma, favor adenocarcinoma. Right lower lobe biopsy-nondiagnostic reactive bronchial cells Right upper lobe ENB assisted FNA, positive for malignancy.  Non-small cell carcinoma, favor adenocarcinoma. Right upper lobe lung ENB with depression-positive for malignancy, non-small cell carcinoma is present Station 10R -positive for malignancy consistent with metastatic non-small cell carcinoma involving lymphoid material. Station 11 R-FNA showed suspicious for malignancy.  Single atypical epithelial groups in a hypocellular specimen Station 7-FNA showed positive for malignancy.  Consistent with metastatic non-small cell carcinoma involving lymphoid material   05/29/2022 Cancer Staging   Staging form: Lung, AJCC 8th Edition - Clinical stage from 05/29/2022: Stage IV (cT3, cN2, cM1) - Signed by Earlie Server, MD on 06/03/2022 Stage prefix: Initial diagnosis   06/22/2022 -  Chemotherapy   Patient is on Treatment Plan : LUNG Carboplatin (5) + Pemetrexed (500) + Pembrolizumab (200) D1 q21d Induction x 6 Cycles      INTERVAL HISTORY JEHIEL SPRATLEY is a 67 y.o. male who has above history reviewed by me today presents for follow up visit for metastatic lung non small cell lung cancer.    Patient was accompanied by daughter.  He feels well, no shortness of breath at rest, some SOB with exertion,  He remains very active.     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    SURGICAL HISTORY: Past Surgical History:   Procedure Laterality Date   COLONOSCOPY     IR IMAGING GUIDED PORT INSERTION  06/19/2022   KNEE ARTHROSCOPY Right 2005   TONSILLECTOMY  1970   VIDEO BRONCHOSCOPY WITH ENDOBRONCHIAL ULTRASOUND N/A 05/29/2022   Procedure: VIDEO BRONCHOSCOPY WITH ENDOBRONCHIAL ULTRASOUND;  Surgeon: Ottie Glazier, MD;  Location: [facility] ORS;  Service: Thoracic;  Laterality: N/A;    SOCIAL HISTORY: Social History   Socioeconomic History   Marital status: Married    Spouse name: [name]   Number of children: 3   Years of education: Not on file   Highest education level: Not on file  Occupational History   Not on file  Tobacco Use   Smoking status: Never   Smokeless tobacco: Never  Vaping Use   Vaping Use: Never used  Substance and Sexual Activity   Alcohol use: Yes    Comment: weekly   Drug use: Not Currently   Sexual activity: Not on file  Other Topics Concern   Not on file  Social History Narrative   Lives at home with wife [name]; 3 kids & 10 grandchildren    Social Determinants of Health   Financial Resource Strain: Not on file  Food Insecurity: Not on file  Transportation Needs: Not on file  Physical Activity: Not on file  Stress: Not on file  Social Connections: Not on file  Intimate Partner Violence: Not on file    FAMILY HISTORY: Family History  Problem Relation Age of Onset  Hypertension Father    Heart disease Father    Hypertension Brother    COPD Brother     ALLERGIES:  has No Known Allergies.  MEDICATIONS:  Current Outpatient Medications  Medication Sig Dispense Refill   acetaminophen (TYLENOL) 500 MG tablet Take 500 mg by mouth every 6 (six) hours as needed for mild pain.     dexamethasone (DECADRON) 2 MG tablet Take 1 tablet (2 mg total) by mouth 2 (two) times daily. 60 tablet 2   dexamethasone (DECADRON) 4 MG tablet Take 1 tab 2 times daily starting day before pemetrexed. Then take 2 tabs daily x 3 days starting day after carboplatin. Take with food. 30 tablet 1    famotidine (PEPCID) 40 MG tablet Take 1 tablet by mouth at bedtime.     folic acid (FOLVITE) 1 MG tablet Take 1 tablet (1 mg total) by mouth daily. Start 7 days before pemetrexed chemotherapy. Continue until 21 days after pemetrexed completed. 100 tablet 3   lidocaine-prilocaine (EMLA) cream Apply 1 Application topically as needed. 30 g 2   lisinopril (ZESTRIL) 5 MG tablet Take 5 mg by mouth daily.     meloxicam (MOBIC) 15 MG tablet Take 15 mg by mouth daily as needed.     ondansetron (ZOFRAN) 8 MG tablet Take 1 tablet (8 mg total) by mouth every 8 (eight) hours as needed for nausea or vomiting. Start on the third day after carboplatin. 30 tablet 1   prochlorperazine (COMPAZINE) 10 MG tablet Take 1 tablet (10 mg total) by mouth every 6 (six) hours as needed for nausea or vomiting. 30 tablet 1   No current facility-administered medications for this visit.   Facility-Administered Medications Ordered in Other Visits  Medication Dose Route Frequency Provider Last Rate Last Admin   heparin lock flush 100 unit/mL  500 Units Intracatheter Once PRN Earlie Server, MD        Review of Systems  Constitutional:  Negative for appetite change, chills, fatigue, fever and unexpected weight change.  HENT:   Negative for hearing loss and voice change.   Eyes:  Negative for eye problems and icterus.  Respiratory:  Positive for cough. Negative for chest tightness and shortness of breath.   Cardiovascular:  Negative for chest pain and leg swelling.  Gastrointestinal:  Negative for abdominal distention and abdominal pain.  Endocrine: Negative for hot flashes.  Genitourinary:  Negative for difficulty urinating, dysuria and frequency.   Musculoskeletal:  Negative for arthralgias.  Skin:  Negative for itching and rash.  Neurological:  Negative for light-headedness and numbness.  Hematological:  Negative for adenopathy. Does not bruise/bleed easily.  Psychiatric/Behavioral:  Negative for confusion.      PHYSICAL  EXAMINATION: ECOG PERFORMANCE STATUS: 0 - Asymptomatic  Vitals:   06/22/22 1312  BP: 138/82  Pulse: 65  Resp: 18  Temp: 97.7 F (36.5 C)   Filed Weights   06/22/22 1312  Weight: 197 lb 11.2 oz (89.7 kg)    Physical Exam Constitutional:      General: He is not in acute distress.    Appearance: He is not diaphoretic.  HENT:     Head: Normocephalic and atraumatic.  Eyes:     General: No scleral icterus. Cardiovascular:     Rate and Rhythm: Normal rate.     Heart sounds: No murmur heard. Pulmonary:     Effort: Pulmonary effort is normal. No respiratory distress.     Comments: Decreased right lower lung breath sound.  Abdominal:  General: Bowel sounds are normal. There is no distension.     Palpations: Abdomen is soft.  Musculoskeletal:        General: Normal range of motion.     Cervical back: Normal range of motion.  Skin:    General: Skin is warm and dry.     Findings: No erythema.  Neurological:     Mental Status: He is alert and oriented to person, place, and time. Mental status is at baseline.     Cranial Nerves: No cranial nerve deficit.     Motor: No abnormal muscle tone.  Psychiatric:        Mood and Affect: Affect normal.      LABORATORY DATA:  I have reviewed the data as listed    Latest Ref Rng & Units 06/22/2022   12:50 PM 06/16/2022    1:25 PM 06/09/2022    1:39 PM  CBC  WBC 4.0 - 10.5 K/uL 12.9  13.3  13.0   Hemoglobin 13.0 - 17.0 g/dL 14.5  14.5  14.9   Hematocrit 39.0 - 52.0 % 43.4  42.2  44.3   Platelets 150 - 400 K/uL 265  310  287       Latest Ref Rng & Units 06/22/2022   12:50 PM 05/29/2022    6:29 AM 05/14/2022   10:51 AM  CMP  Glucose 70 - 99 mg/dL 124  100  102   BUN 8 - 23 mg/dL '29  20  15   '$ Creatinine 0.61 - 1.24 mg/dL 0.99  1.20  1.06   Sodium 135 - 145 mmol/L 136  140  135   Potassium 3.5 - 5.1 mmol/L 4.5  4.6  5.1   Chloride 98 - 111 mmol/L 103  105  101   CO2 22 - 32 mmol/L 23   26   Calcium 8.9 - 10.3 mg/dL 8.7   8.6    Total Protein 6.5 - 8.1 g/dL 7.0   7.6   Total Bilirubin 0.3 - 1.2 mg/dL 1.0   0.8   Alkaline Phos 38 - 126 U/L 62   81   AST 15 - 41 U/L 18   19   ALT 0 - 44 U/L 21   15      RADIOGRAPHIC STUDIES: I have personally reviewed the radiological images as listed and agreed with the findings in the report. IR IMAGING GUIDED PORT INSERTION  Result Date: 06/19/2022 CLINICAL DATA:  Metastatic lung carcinoma, needs durable venous access for planned treatment regimen EXAM: TUNNELED PORT CATHETER PLACEMENT WITH ULTRASOUND AND FLUOROSCOPIC GUIDANCE FLUOROSCOPY: Radiation Exposure Index (as provided by the fluoroscopic device): 2 mGy air Kerma ANESTHESIA/SEDATION: Intravenous Fentanyl 171mg and Versed '2mg'$  were administered as conscious sedation during continuous monitoring of the patient's level of consciousness and physiological / cardiorespiratory status by the radiology RN, with a total moderate sedation time of 22 minutes. TECHNIQUE: The procedure, risks, benefits, and alternatives were explained to the patient. Questions regarding the procedure were encouraged and answered. The patient understands and consents to the procedure. Patency of the right IJ vein was confirmed with ultrasound with image documentation. An appropriate skin site was determined. Skin site was marked. Region was prepped using maximum barrier technique including cap and mask, sterile gown, sterile gloves, large sterile sheet, and Chlorhexidine as cutaneous antisepsis. The region was infiltrated locally with 1% lidocaine. Under real-time ultrasound guidance, the right IJ vein was accessed with a 21 gauge micropuncture needle; the needle tip within the vein was  confirmed with ultrasound image documentation. Needle was exchanged over a 018 guidewire for transitional dilator, and vascular measurement was performed. A small incision was made on the right anterior chest wall and a subcutaneous pocket fashioned. The power-injectable port was  positioned and its catheter tunneled to the right IJ dermatotomy site. The transitional dilator was exchanged over an Amplatz wire for a peel-away sheath, through which the port catheter, which had been trimmed to the appropriate length, was advanced and positioned under fluoroscopy with its tip at the cavoatrial junction. Spot chest radiograph confirms good catheter position and no pneumothorax. The port was flushed per protocol. The pocket was closed with deep interrupted and subcuticular continuous 3-0 Monocryl sutures. The incisions were covered with Dermabond then covered with a sterile dressing. The patient tolerated the procedure well. COMPLICATIONS: COMPLICATIONS None immediate IMPRESSION: Technically successful right IJ power-injectable port catheter placement. Ready for routine use. Electronically Signed   By: Lucrezia Europe M.D.   On: 06/19/2022 17:51   X-ray chest PA or AP  Result Date: 05/29/2022 CLINICAL DATA:  Bronchoscopy EXAM: CHEST  1 VIEW COMPARISON:  05/22/2022 FINDINGS: Stable heart size. Loculated appearing small-moderate right pleural effusion with masslike right perihilar opacity and dense right basilar consolidation. Worsening aeration within the right lung base. No pneumothorax. Left lung is clear. IMPRESSION: 1. No pneumothorax following bronchoscopy. 2. Worsening aeration of the right lung base, atelectasis or pneumonia. Electronically Signed   By: Davina Poke D.O.   On: 05/29/2022 10:08   DG C-Arm 1-60 Min-No Report  Result Date: 05/29/2022 Fluoroscopy was utilized by the requesting physician.  No radiographic interpretation.   CT Super D Chest Wo Contrast  Result Date: 05/27/2022 CLINICAL DATA:  Lung mass with brain metastases. Preoperative/planning study. EXAM: CT CHEST WITHOUT CONTRAST TECHNIQUE: Multidetector CT imaging of the chest was performed using thin slice collimation for electromagnetic bronchoscopy planning purposes, without intravenous contrast. RADIATION DOSE  REDUCTION: This exam was performed according to the departmental dose-optimization program which includes automated exposure control, adjustment of the mA and/or kV according to patient size and/or use of iterative reconstruction technique. COMPARISON:  Chest CT 04/30/2022.  PET-CT 05/13/2022. FINDINGS: Cardiovascular: The heart size is normal. No substantial pericardial effusion. Coronary artery calcification is evident. Mild atherosclerotic calcification is noted in the wall of the thoracic aorta. Mediastinum/Nodes: Small scattered mediastinal lymph nodes again noted. No evidence for gross hilar lymphadenopathy although assessment is limited by the lack of intravenous contrast on the current study. 10 mm short axis right hilar node evident on 76/2, stable the esophagus has normal imaging features. There is no axillary lymphadenopathy. Lungs/Pleura: Similar appearance of the right parahilar mass lesion measuring 6.2 cm today on 72/3 compared to 5.6 cm previously. Adjacent areas of satellite nodularity and linear segmental atelectasis scarring are similar to prior. Additional areas of masslike focal consolidative opacity are seen in the right middle and lower lobes without substantial change since the recent CT chest with contrast. Small to moderate loculated right pleural effusion is stable. Stable 4 mm anterior left upper lobe nodule on 30/3. 4 mm left upper lobe nodule on 80/3 is stable. Tiny posterior left upper lobe nodule on 52/3 is unchanged. Upper Abdomen: 2 cm right adrenal adenoma is stable. Visualized upper abdomen otherwise unremarkable. Musculoskeletal: No worrisome lytic or sclerotic osseous abnormality. IMPRESSION: 1. Similar appearance of the right parahilar mass lesion with adjacent areas of satellite nodularity and linear segmental atelectasis/scarring. 2. Additional areas of masslike focal consolidative opacity in the right middle  and lower lobes without substantial change since the recent CT  chest with contrast. 3. No substantial change small to moderate loculated right pleural effusion. 4. Stable tiny left pulmonary nodules. 5. Stable 2 cm right adrenal adenoma. 6.  Aortic Atherosclerosis (ICD10-I70.0). Electronically Signed   By: Misty Stanley M.D.   On: 05/27/2022 09:13

## 2022-06-22 NOTE — Assessment & Plan Note (Signed)
S/p  palliative radiation. He is on dexamethasone, defer tapering course instruction to [place].

## 2022-06-23 ENCOUNTER — Telehealth: Payer: Self-pay

## 2022-06-23 LAB — T4: T4, Total: 6 ug/dL (ref 4.5–12.0)

## 2022-06-23 NOTE — Telephone Encounter (Signed)
Telephone call to patient for follow up after receiving first infusion.   No answer but left message stating we were calling to check on them.  Encouraged patient to call for any questions or concerns.   

## 2022-06-24 ENCOUNTER — Other Ambulatory Visit: Payer: Self-pay

## 2022-06-26 ENCOUNTER — Encounter: Payer: Self-pay | Admitting: *Deleted

## 2022-06-29 ENCOUNTER — Inpatient Hospital Stay (HOSPITAL_BASED_OUTPATIENT_CLINIC_OR_DEPARTMENT_OTHER): Payer: Medicare Other | Admitting: Oncology

## 2022-06-29 ENCOUNTER — Inpatient Hospital Stay: Payer: Medicare Other | Attending: Oncology

## 2022-06-29 ENCOUNTER — Encounter: Payer: Self-pay | Admitting: Oncology

## 2022-06-29 ENCOUNTER — Encounter: Payer: Self-pay | Admitting: *Deleted

## 2022-06-29 VITALS — BP 107/79 | HR 76 | Temp 98.3°F | Wt 193.7 lb

## 2022-06-29 DIAGNOSIS — C3411 Malignant neoplasm of upper lobe, right bronchus or lung: Secondary | ICD-10-CM | POA: Diagnosis present

## 2022-06-29 DIAGNOSIS — Z5111 Encounter for antineoplastic chemotherapy: Secondary | ICD-10-CM | POA: Diagnosis present

## 2022-06-29 DIAGNOSIS — C7931 Secondary malignant neoplasm of brain: Secondary | ICD-10-CM | POA: Diagnosis not present

## 2022-06-29 DIAGNOSIS — J910 Malignant pleural effusion: Secondary | ICD-10-CM

## 2022-06-29 DIAGNOSIS — C3490 Malignant neoplasm of unspecified part of unspecified bronchus or lung: Secondary | ICD-10-CM | POA: Diagnosis not present

## 2022-06-29 LAB — CBC WITH DIFFERENTIAL (CANCER CENTER ONLY)
Abs Immature Granulocytes: 0.03 10*3/uL (ref 0.00–0.07)
Basophils Absolute: 0 10*3/uL (ref 0.0–0.1)
Basophils Relative: 1 %
Eosinophils Absolute: 0.2 10*3/uL (ref 0.0–0.5)
Eosinophils Relative: 4 %
HCT: 41.3 % (ref 39.0–52.0)
Hemoglobin: 13.7 g/dL (ref 13.0–17.0)
Immature Granulocytes: 1 %
Lymphocytes Relative: 15 %
Lymphs Abs: 0.9 10*3/uL (ref 0.7–4.0)
MCH: 30.6 pg (ref 26.0–34.0)
MCHC: 33.2 g/dL (ref 30.0–36.0)
MCV: 92.4 fL (ref 80.0–100.0)
Monocytes Absolute: 0.3 10*3/uL (ref 0.1–1.0)
Monocytes Relative: 5 %
Neutro Abs: 4.5 10*3/uL (ref 1.7–7.7)
Neutrophils Relative %: 74 %
Platelet Count: 179 10*3/uL (ref 150–400)
RBC: 4.47 MIL/uL (ref 4.22–5.81)
RDW: 12.4 % (ref 11.5–15.5)
WBC Count: 6 10*3/uL (ref 4.0–10.5)
nRBC: 0 % (ref 0.0–0.2)

## 2022-06-29 LAB — CMP (CANCER CENTER ONLY)
ALT: 20 U/L (ref 0–44)
AST: 19 U/L (ref 15–41)
Albumin: 3.5 g/dL (ref 3.5–5.0)
Alkaline Phosphatase: 70 U/L (ref 38–126)
Anion gap: 9 (ref 5–15)
BUN: 28 mg/dL — ABNORMAL HIGH (ref 8–23)
CO2: 27 mmol/L (ref 22–32)
Calcium: 8.6 mg/dL — ABNORMAL LOW (ref 8.9–10.3)
Chloride: 100 mmol/L (ref 98–111)
Creatinine: 0.99 mg/dL (ref 0.61–1.24)
GFR, Estimated: >60 mL/min (ref >60)
Glucose, Bld: 101 mg/dL — ABNORMAL HIGH (ref 70–99)
Potassium: 4.6 mmol/L (ref 3.5–5.1)
Sodium: 136 mmol/L (ref 135–145)
Total Bilirubin: 1.2 mg/dL (ref 0.3–1.2)
Total Protein: 6.9 g/dL (ref 6.5–8.1)

## 2022-06-29 NOTE — Assessment & Plan Note (Addendum)
S/p  palliative radiation. He is on dexamethasone  tapering course

## 2022-06-29 NOTE — Assessment & Plan Note (Addendum)
Likely malignant pleural effusion.  Status post diagnostic thoracentesis.  Cytology not diagnostic. He is currently not symptomatic.  Asked patient to notify office if he develops symptoms.

## 2022-06-29 NOTE — Assessment & Plan Note (Addendum)
Imaging results and pathology results were reviewed and discussed with patient.  The diagnosis of stage IV non-small cell lung cancer and care plan were discussed with patient in detail.  The goal of adjuvant chemotherapy treatment is with palliative intent  Limited quality of biopsy tissue for NGS and PDL-1 IHC.  Liquid biopsy showed no targetable mutation Labs are reviewed and discussed with patient. He tolerates chemotherapy carboplatin Alimta and Keytrua  Patient plans to get a second opinion at [facility].

## 2022-06-29 NOTE — Progress Notes (Signed)
Hematology/Oncology Consult Note [phone] Fax:([phone]    CHIEF COMPLAINTS/PURPOSE OF CONSULTATION:  Metastatic non-small cell lung cancer, brain metastasis.   ASSESSMENT & PLAN:   Cancer Staging  Metastatic non-small cell lung cancer [facility]) Staging form: Lung, AJCC 8th Edition - Clinical stage from 05/29/2022: Stage IV (cT3, cN2, cM1) - Signed by Earlie Server, MD on 06/03/2022    Metastatic non-small cell lung cancer [facility]) Imaging results and pathology results were reviewed and discussed with patient.  The diagnosis of stage IV non-small cell lung cancer and care plan were discussed with patient in detail.  The goal of adjuvant chemotherapy treatment is with palliative intent  Limited quality of biopsy tissue for NGS and PDL-1 IHC.  Liquid biopsy showed no targetable mutation Labs are reviewed and discussed with patient. He tolerates chemotherapy carboplatin Alimta and Keytrua  Patient plans to get a second opinion at [facility].     Metastasis to brain [facility]) S/p  palliative radiation. 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  Status post diagnostic thoracentesis.  Cytology not diagnostic. He is currently not symptomatic.  Asked patient to notify office if he develops symptoms.   Follow-up   2 weeks lab MD chemotherapy All questions were answered. The patient knows to call the clinic with any problems, questions or concerns.  Earlie Server, MD, PhD [facility] Hematology Oncology 06/29/2022   HISTORY OF PRESENTING ILLNESS:  NASAI DIVEN 67 y.o. male presents to establish care for metastatic lung cancer.  Patient is a lifelong never smoker.  Oncology History  Metastatic non-small cell lung cancer ([place])  04/30/2022 Imaging   CT chest w contrast showed  1. Perihilar right upper lobe masslike opacity measuring 3.4 x 5.6 x 3.8 cm, This abuts the major fissure 2. Moderate-sized right pleural  effusion which is partially loculated tracks anteriorly the lower hemithorax. Possible occasional pleural enhancement and thickening posteriorly. 3. Rounded masslike opacity in the dependent right lower lobe measuring 5.8 x 4.1 cm 4. Additional bilateral pulmonary nodules, largest measuring 7 mm in the right upper lobe. 5. Prominent mediastinal and right hilar lymph nodes.  6   Low-density 2.4 cm right adrenal nodule. This is unchanged from 12/15/2019 abdominal CT in typical of adenoma   05/05/2022 Procedure   status post ultrasound-guided right thoracentesis.  Cytology showed suspicious for malignancy, background lymphocyte predominant effusion.    05/13/2022 Imaging   PET scan initial staging showed 1. 5.9 by 3.5 cm right hilar mass with maximum SUV 5.7, compatible with malignancy. The epicenter of this mass appears to be in the right upper lobe. Faintly increased activity in the right hilar lymph node which could be involved. 2. Large right pleural effusion with pleural thickening and subtle accentuated activity along the pleural surface without a well-defined masslike pleural lesion. Correlate with recent thoracocentesis results. 3. Low-grade activity in the collapsed right lower lobe, maximum SUV 3.2, probably inflammatory. 4. No findings of malignancy outside of the chest. 5. 2.4 cm right adrenal adenoma does not require follow up. 6. 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 Lumbar spondylosis and degenerative disc disease. 7. Aortic atherosclerosis.   05/21/2022 Imaging   MRI brain with and without contrast showed multiple peripherally enhancing lesions in the right middle cerebral hemispheres, concerning for metastatic disease.   05/29/2022 Initial Diagnosis   Metastatic non-small cell lung cancer ([place])  02/25/22 patient was seen by PCP and was  diagnosed with bronchitis, was treated with Azithromycin.  Cough did not improve. He took levaquin for 10  days without improvement either.   05/29/2022 status post microscopy biopsy. Right upper lobe biopsy-non-small cell carcinoma, favor adenocarcinoma. Right lower lobe biopsy-nondiagnostic reactive bronchial cells Right upper lobe ENB assisted FNA, positive for malignancy.  Non-small cell carcinoma, favor adenocarcinoma. Right upper lobe lung ENB with depression-positive for malignancy, non-small cell carcinoma is present Station 10R -positive for malignancy consistent with metastatic non-small cell carcinoma involving lymphoid material. Station 11 R-FNA showed suspicious for malignancy.  Single atypical epithelial groups in a hypocellular specimen Station 7-FNA showed positive for malignancy.  Consistent with metastatic non-small cell carcinoma involving lymphoid material   05/29/2022 Cancer Staging   Staging form: Lung, AJCC 8th Edition - Clinical stage from 05/29/2022: Stage IV (cT3, cN2, cM1) - Signed by Earlie Server, MD on 06/03/2022 Stage prefix: Initial diagnosis   06/22/2022 -  Chemotherapy   Patient is on Treatment Plan : LUNG Carboplatin (5) + Pemetrexed (500) + Pembrolizumab (200) D1 q21d Induction x 6 Cycles      INTERVAL HISTORY FERNAND SHEFFLER is a 67 y.o. male who has above history reviewed by me today presents for follow up visit for metastatic lung non small cell lung cancer.    Patient was accompanied by wife He feels well, no shortness of breath at rest S/p cycle 1 chemotherapy. Tolerates well, denies nausea vomiting diarrhea.  He remains very active.     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    SURGICAL HISTORY: Past Surgical History:  Procedure Laterality Date   COLONOSCOPY     IR IMAGING GUIDED PORT INSERTION  06/19/2022   KNEE ARTHROSCOPY Right 2005   TONSILLECTOMY  1970   VIDEO BRONCHOSCOPY WITH ENDOBRONCHIAL ULTRASOUND N/A 05/29/2022   Procedure: VIDEO  BRONCHOSCOPY WITH ENDOBRONCHIAL ULTRASOUND;  Surgeon: Ottie Glazier, MD;  Location: [facility] ORS;  Service: Thoracic;  Laterality: N/A;    SOCIAL HISTORY: Social History   Socioeconomic History   Marital status: Married    Spouse name: [name]   Number of children: 3   Years of education: Not on file   Highest education level: Not on file  Occupational History   Not on file  Tobacco Use   Smoking status: Never   Smokeless tobacco: Never  Vaping Use   Vaping Use: Never used  Substance and Sexual Activity   Alcohol use: Yes    Comment: weekly   Drug use: Not Currently   Sexual activity: Not on file  Other Topics Concern   Not on file  Social History Narrative   Lives at home with wife [name]; 3 kids & 10 grandchildren    Social Determinants of Health   Financial Resource Strain: Not on file  Food Insecurity: Not on file  Transportation Needs: Not on file  Physical Activity: Not on file  Stress: Not on file  Social Connections: Not on file  Intimate Partner Violence: Not on file    FAMILY HISTORY: Family History  Problem Relation Age of Onset   Hypertension Father    Heart disease Father    Hypertension Brother    COPD Brother     ALLERGIES:  has No Known Allergies.  MEDICATIONS:  Current Outpatient Medications  Medication Sig Dispense Refill   acetaminophen (TYLENOL) 500 MG tablet Take 500 mg by mouth every  6 (six) hours as needed for mild pain.     dexamethasone (DECADRON) 2 MG tablet Take 1 tablet (2 mg total) by mouth 2 (two) times daily. 60 tablet 2   dexamethasone (DECADRON) 4 MG tablet Take 1 tab 2 times daily starting day before pemetrexed. Then take 2 tabs daily x 3 days starting day after carboplatin. Take with food. 30 tablet 1   famotidine (PEPCID) 40 MG tablet Take 1 tablet by mouth at bedtime.     folic acid (FOLVITE) 1 MG tablet Take 1 tablet (1 mg total) by mouth daily. Start 7 days before pemetrexed chemotherapy. Continue until 21 days after  pemetrexed completed. 100 tablet 3   lidocaine-prilocaine (EMLA) cream Apply 1 Application topically as needed. 30 g 2   lisinopril (ZESTRIL) 5 MG tablet Take 5 mg by mouth daily.     meloxicam (MOBIC) 15 MG tablet Take 15 mg by mouth daily as needed. (Patient not taking: Reported on 06/29/2022)     ondansetron (ZOFRAN) 8 MG tablet Take 1 tablet (8 mg total) by mouth every 8 (eight) hours as needed for nausea or vomiting. Start on the third day after carboplatin. (Patient not taking: Reported on 06/29/2022) 30 tablet 1   prochlorperazine (COMPAZINE) 10 MG tablet Take 1 tablet (10 mg total) by mouth every 6 (six) hours as needed for nausea or vomiting. (Patient not taking: Reported on 06/29/2022) 30 tablet 1   No current facility-administered medications for this visit.    Review of Systems  Constitutional:  Negative for appetite change, chills, fatigue, fever and unexpected weight change.  HENT:   Negative for hearing loss and voice change.   Eyes:  Negative for eye problems and icterus.  Respiratory:  Positive for cough. Negative for chest tightness and shortness of breath.   Cardiovascular:  Negative for chest pain and leg swelling.  Gastrointestinal:  Negative for abdominal distention and abdominal pain.  Endocrine: Negative for hot flashes.  Genitourinary:  Negative for difficulty urinating, dysuria and frequency.   Musculoskeletal:  Negative for arthralgias.  Skin:  Negative for itching and rash.  Neurological:  Negative for light-headedness and numbness.  Hematological:  Negative for adenopathy. Does not bruise/bleed easily.  Psychiatric/Behavioral:  Negative for confusion.      PHYSICAL EXAMINATION: ECOG PERFORMANCE STATUS: 0 - Asymptomatic  Vitals:   06/29/22 0857  BP: 107/79  Pulse: 76  Temp: 98.3 F (36.8 C)  SpO2: 100%   Filed Weights   06/29/22 0857  Weight: 193 lb 11.2 oz (87.9 kg)    Physical Exam Constitutional:      General: He is not in acute distress.     Appearance: He is not diaphoretic.  HENT:     Head: Normocephalic and atraumatic.  Eyes:     General: No scleral icterus. Cardiovascular:     Rate and Rhythm: Normal rate.     Heart sounds: No murmur heard. Pulmonary:     Effort: Pulmonary effort is normal. No respiratory distress.     Comments: Decreased right lower lung breath sound.  Abdominal:     General: Bowel sounds are normal. There is no distension.     Palpations: Abdomen is soft.  Musculoskeletal:        General: Normal range of motion.     Cervical back: Normal range of motion.  Skin:    General: Skin is warm and dry.     Findings: No erythema.  Neurological:     Mental Status: He is alert  and oriented to person, place, and time. Mental status is at baseline.     Cranial Nerves: No cranial nerve deficit.     Motor: No abnormal muscle tone.  Psychiatric:        Mood and Affect: Affect normal.      LABORATORY DATA:  I have reviewed the data as listed    Latest Ref Rng & Units 06/29/2022    8:41 AM 06/22/2022   12:50 PM 06/16/2022    1:25 PM  CBC  WBC 4.0 - 10.5 K/uL 6.0  12.9  13.3   Hemoglobin 13.0 - 17.0 g/dL 13.7  14.5  14.5   Hematocrit 39.0 - 52.0 % 41.3  43.4  42.2   Platelets 150 - 400 K/uL 179  265  310       Latest Ref Rng & Units 06/29/2022    8:41 AM 06/22/2022   12:50 PM 05/29/2022    6:29 AM  CMP  Glucose 70 - 99 mg/dL 101  124  100   BUN 8 - 23 mg/dL '28  29  20   '$ Creatinine 0.61 - 1.24 mg/dL 0.99  0.99  1.20   Sodium 135 - 145 mmol/L 136  136  140   Potassium 3.5 - 5.1 mmol/L 4.6  4.5  4.6   Chloride 98 - 111 mmol/L 100  103  105   CO2 22 - 32 mmol/L 27  23    Calcium 8.9 - 10.3 mg/dL 8.6  8.7    Total Protein 6.5 - 8.1 g/dL 6.9  7.0    Total Bilirubin 0.3 - 1.2 mg/dL 1.2  1.0    Alkaline Phos 38 - 126 U/L 70  62    AST 15 - 41 U/L 19  18    ALT 0 - 44 U/L 20  21       RADIOGRAPHIC STUDIES: I have personally reviewed the radiological images as listed and agreed with the findings in the  report. IR IMAGING GUIDED PORT INSERTION  Result Date: 06/19/2022 CLINICAL DATA:  Metastatic lung carcinoma, needs durable venous access for planned treatment regimen EXAM: TUNNELED PORT CATHETER PLACEMENT WITH ULTRASOUND AND FLUOROSCOPIC GUIDANCE FLUOROSCOPY: Radiation Exposure Index (as provided by the fluoroscopic device): 2 mGy air Kerma ANESTHESIA/SEDATION: Intravenous Fentanyl 115mg and Versed '2mg'$  were administered as conscious sedation during continuous monitoring of the patient's level of consciousness and physiological / cardiorespiratory status by the radiology RN, with a total moderate sedation time of 22 minutes. TECHNIQUE: The procedure, risks, benefits, and alternatives were explained to the patient. Questions regarding the procedure were encouraged and answered. The patient understands and consents to the procedure. Patency of the right IJ vein was confirmed with ultrasound with image documentation. An appropriate skin site was determined. Skin site was marked. Region was prepped using maximum barrier technique including cap and mask, sterile gown, sterile gloves, large sterile sheet, and Chlorhexidine as cutaneous antisepsis. The region was infiltrated locally with 1% lidocaine. Under real-time ultrasound guidance, the right IJ vein was accessed with a 21 gauge micropuncture needle; the needle tip within the vein was confirmed with ultrasound image documentation. Needle was exchanged over a 018 guidewire for transitional dilator, and vascular measurement was performed. A small incision was made on the right anterior chest wall and a subcutaneous pocket fashioned. The power-injectable port was positioned and its catheter tunneled to the right IJ dermatotomy site. The transitional dilator was exchanged over an Amplatz wire for a peel-away sheath, through which the port catheter,  which had been trimmed to the appropriate length, was advanced and positioned under fluoroscopy with its tip at the  cavoatrial junction. Spot chest radiograph confirms good catheter position and no pneumothorax. The port was flushed per protocol. The pocket was closed with deep interrupted and subcuticular continuous 3-0 Monocryl sutures. The incisions were covered with Dermabond then covered with a sterile dressing. The patient tolerated the procedure well. COMPLICATIONS: COMPLICATIONS None immediate IMPRESSION: Technically successful right IJ power-injectable port catheter placement. Ready for routine use. Electronically Signed   By: Lucrezia Europe M.D.   On: 06/19/2022 17:51

## 2022-07-01 ENCOUNTER — Ambulatory Visit: Payer: Medicare Other | Admitting: Oncology

## 2022-07-01 ENCOUNTER — Other Ambulatory Visit: Payer: Medicare Other

## 2022-07-06 ENCOUNTER — Encounter: Payer: Self-pay | Admitting: Pathology

## 2022-07-06 LAB — ACID FAST CULTURE WITH REFLEXED SENSITIVITIES (MYCOBACTERIA): Acid Fast Culture: NEGATIVE

## 2022-07-10 MED FILL — Dexamethasone Sodium Phosphate Inj 100 MG/10ML: INTRAMUSCULAR | Qty: 1 | Status: AC

## 2022-07-10 MED FILL — Fosaprepitant Dimeglumine For IV Infusion 150 MG (Base Eq): INTRAVENOUS | Qty: 5 | Status: AC

## 2022-07-13 ENCOUNTER — Inpatient Hospital Stay: Payer: Medicare Other

## 2022-07-13 ENCOUNTER — Inpatient Hospital Stay (HOSPITAL_BASED_OUTPATIENT_CLINIC_OR_DEPARTMENT_OTHER): Payer: Medicare Other | Admitting: Oncology

## 2022-07-13 ENCOUNTER — Ambulatory Visit: Payer: Medicare Other | Admitting: Radiation Oncology

## 2022-07-13 ENCOUNTER — Encounter: Payer: Self-pay | Admitting: *Deleted

## 2022-07-13 ENCOUNTER — Encounter: Payer: Self-pay | Admitting: Oncology

## 2022-07-13 VITALS — BP 142/98 | HR 66 | Temp 97.3°F | Resp 18 | Wt 195.0 lb

## 2022-07-13 DIAGNOSIS — Z5111 Encounter for antineoplastic chemotherapy: Secondary | ICD-10-CM

## 2022-07-13 DIAGNOSIS — J910 Malignant pleural effusion: Secondary | ICD-10-CM

## 2022-07-13 DIAGNOSIS — C3490 Malignant neoplasm of unspecified part of unspecified bronchus or lung: Secondary | ICD-10-CM

## 2022-07-13 DIAGNOSIS — C7931 Secondary malignant neoplasm of brain: Secondary | ICD-10-CM

## 2022-07-13 LAB — CBC WITH DIFFERENTIAL/PLATELET
Abs Immature Granulocytes: 0.19 10*3/uL — ABNORMAL HIGH (ref 0.00–0.07)
Basophils Absolute: 0.1 10*3/uL (ref 0.0–0.1)
Basophils Relative: 1 %
Eosinophils Absolute: 0.1 10*3/uL (ref 0.0–0.5)
Eosinophils Relative: 1 %
HCT: 38.8 % — ABNORMAL LOW (ref 39.0–52.0)
Hemoglobin: 13.1 g/dL (ref 13.0–17.0)
Immature Granulocytes: 2 %
Lymphocytes Relative: 10 %
Lymphs Abs: 0.8 10*3/uL (ref 0.7–4.0)
MCH: 31 pg (ref 26.0–34.0)
MCHC: 33.8 g/dL (ref 30.0–36.0)
MCV: 91.9 fL (ref 80.0–100.0)
Monocytes Absolute: 0.9 10*3/uL (ref 0.1–1.0)
Monocytes Relative: 11 %
Neutro Abs: 5.8 10*3/uL (ref 1.7–7.7)
Neutrophils Relative %: 75 %
Platelets: 266 10*3/uL (ref 150–400)
RBC: 4.22 MIL/uL (ref 4.22–5.81)
RDW: 13.6 % (ref 11.5–15.5)
WBC: 7.8 10*3/uL (ref 4.0–10.5)
nRBC: 0 % (ref 0.0–0.2)

## 2022-07-13 LAB — COMPREHENSIVE METABOLIC PANEL
ALT: 22 U/L (ref 0–44)
AST: 20 U/L (ref 15–41)
Albumin: 3.4 g/dL — ABNORMAL LOW (ref 3.5–5.0)
Alkaline Phosphatase: 58 U/L (ref 38–126)
Anion gap: 11 (ref 5–15)
BUN: 22 mg/dL (ref 8–23)
CO2: 25 mmol/L (ref 22–32)
Calcium: 8.8 mg/dL — ABNORMAL LOW (ref 8.9–10.3)
Chloride: 101 mmol/L (ref 98–111)
Creatinine, Ser: 1.02 mg/dL (ref 0.61–1.24)
GFR, Estimated: >60 mL/min (ref >60)
Glucose, Bld: 105 mg/dL — ABNORMAL HIGH (ref 70–99)
Potassium: 4.1 mmol/L (ref 3.5–5.1)
Sodium: 137 mmol/L (ref 135–145)
Total Bilirubin: 0.7 mg/dL (ref 0.3–1.2)
Total Protein: 6.9 g/dL (ref 6.5–8.1)

## 2022-07-13 MED ORDER — PALONOSETRON HCL INJECTION 0.25 MG/5ML
0.2500 mg | Freq: Once | INTRAVENOUS | Status: AC
  Administered 2022-07-13: 0.25 mg via INTRAVENOUS
  Filled 2022-07-13: qty 5

## 2022-07-13 MED ORDER — HEPARIN SOD (PORK) LOCK FLUSH 100 UNIT/ML IV SOLN
INTRAVENOUS | Status: AC
  Administered 2022-07-13: 500 [IU]
  Filled 2022-07-13: qty 5

## 2022-07-13 MED ORDER — SODIUM CHLORIDE 0.9 % IV SOLN
150.0000 mg | Freq: Once | INTRAVENOUS | Status: AC
  Administered 2022-07-13: 150 mg via INTRAVENOUS
  Filled 2022-07-13: qty 150

## 2022-07-13 MED ORDER — SODIUM CHLORIDE 0.9 % IV SOLN
10.0000 mg | Freq: Once | INTRAVENOUS | Status: AC
  Administered 2022-07-13: 10 mg via INTRAVENOUS
  Filled 2022-07-13: qty 10

## 2022-07-13 MED ORDER — SODIUM CHLORIDE 0.9 % IV SOLN
Freq: Once | INTRAVENOUS | Status: AC
  Filled 2022-07-13: qty 250

## 2022-07-13 MED ORDER — SODIUM CHLORIDE 0.9 % IV SOLN
500.0000 mg/m2 | Freq: Once | INTRAVENOUS | Status: AC
  Administered 2022-07-13: 1100 mg via INTRAVENOUS
  Filled 2022-07-13: qty 40

## 2022-07-13 MED ORDER — SODIUM CHLORIDE 0.9 % IV SOLN
571.0000 mg | Freq: Once | INTRAVENOUS | Status: AC
  Administered 2022-07-13: 570 mg via INTRAVENOUS
  Filled 2022-07-13: qty 57

## 2022-07-13 MED ORDER — HEPARIN SOD (PORK) LOCK FLUSH 100 UNIT/ML IV SOLN
500.0000 [IU] | Freq: Once | INTRAVENOUS | Status: AC | PRN
  Filled 2022-07-13: qty 5

## 2022-07-13 NOTE — Addendum Note (Signed)
Encounter addended by: Logan Bores on: 07/13/2022 1:38 PM  Actions taken: Imaging Exam ended

## 2022-07-13 NOTE — Patient Instructions (Signed)
[facility]  Discharge Instructions: Thank you for choosing [facility] to provide your oncology and hematology care.  If you have a lab appointment with the [facility], please go directly to the [facility] and check in at the registration area.  Wear comfortable clothing and clothing appropriate for easy access to any Portacath or PICC line.   We strive to give you quality time with your provider. You may need to reschedule your appointment if you arrive late (15 or more minutes).  Arriving late affects you and other patients whose appointments are after yours.  Also, if you miss three or more appointments without notifying the office, you may be dismissed from the clinic at the provider's discretion.      For prescription refill requests, have your pharmacy contact our office and allow 72 hours for refills to be completed.    Today you received the following chemotherapy and/or immunotherapy agents Alimta and Carboplatin       To help prevent nausea and vomiting after your treatment, we encourage you to take your nausea medication as directed.  BELOW ARE SYMPTOMS THAT SHOULD BE REPORTED IMMEDIATELY: *FEVER GREATER THAN 100.4 F (38 C) OR HIGHER *CHILLS OR SWEATING *NAUSEA AND VOMITING THAT IS NOT CONTROLLED WITH YOUR NAUSEA MEDICATION *UNUSUAL SHORTNESS OF BREATH *UNUSUAL BRUISING OR BLEEDING *URINARY PROBLEMS (pain or burning when urinating, or frequent urination) *BOWEL PROBLEMS (unusual diarrhea, constipation, pain near the anus) TENDERNESS IN MOUTH AND THROAT WITH OR WITHOUT PRESENCE OF ULCERS (sore throat, sores in mouth, or a toothache) UNUSUAL RASH, SWELLING OR PAIN  UNUSUAL VAGINAL DISCHARGE OR ITCHING   Items with * indicate a potential emergency and should be followed up as soon as possible or go to the Emergency Department if any problems should occur.  Please show the CHEMOTHERAPY ALERT CARD or IMMUNOTHERAPY ALERT CARD  at check-in to the Emergency Department and triage nurse.  Should you have questions after your visit or need to cancel or reschedule your appointment, please contact [facility]  [phone] and follow the prompts.  Office hours are 8:00 a.m. to 4:30 p.m. Monday - Friday. Please note that voicemails left after 4:00 p.m. may not be returned until the following business day.  We are closed weekends and major holidays. You have access to a nurse at all times for urgent questions. Please call the main number to the clinic [phone] and follow the prompts.  For any non-urgent questions, you may also contact your provider using MyChart. We now offer e-Visits for anyone 51 and older to request care online for non-urgent symptoms. For details visit [email or web address].   Also download the MyChart app! Go to the app store, search "MyChart", open the app, select [facility], and log in with your MyChart username and password.

## 2022-07-13 NOTE — Assessment & Plan Note (Addendum)
S/p  palliative radiation. He is on dexamethasone  tapering course, instructed patient to further taper down to 1 mg daily for 1 week and then stop [He will take 4 mg daily for 3 days after each carboplatin treatments.

## 2022-07-13 NOTE — Addendum Note (Signed)
Encounter addended by: Logan Bores on: 07/13/2022 1:42 PM  Actions taken: Imaging Exam ended

## 2022-07-13 NOTE — Progress Notes (Signed)
Hematology/Oncology Progress note [phone] [phone]) [phone]       CHIEF COMPLAINTS/PURPOSE OF CONSULTATION:  Metastatic non-small cell lung cancer, brain metastasis.   ASSESSMENT & PLAN:   Cancer Staging  Metastatic non-small cell lung cancer [facility]) Staging form: Lung, AJCC 8th Edition - Clinical stage from 05/29/2022: Stage IV (cT3, cN2, cM1) - Signed by Earlie Server, MD on 06/03/2022    Metastatic non-small cell lung cancer [facility]) Imaging results and pathology results were reviewed and discussed with patient.  The diagnosis of stage IV non-small cell lung cancer and care plan were discussed with patient in detail.  The goal of adjuvant chemotherapy treatment is with palliative intent  Not enough biopsy tissue for NGS   Liquid biopsy showed no targetable mutation Labs are reviewed and discussed with patient. [facility] recommendation was reviewed and discussed with patient. Proceed with carboplatin Alimta, hold Keytruda, awaiting NGS on the second biopsy done at [facility]      Encounter for antineoplastic chemotherapy Treatment plan as discussed above.   Metastasis to brain [facility]) S/p  palliative radiation. He is on dexamethasone  tapering course, instructed patient to further taper down to 1 mg daily for 1 week and then stop [He will take 4 mg daily for 3 days after each carboplatin treatments.  Malignant pleural effusion Status post paracentesis at [facility].  Status post pleural catheter placement.    Follow-up   3 weeks lab MD chemotherapy All questions were answered. The patient knows to call the clinic with any problems, questions or concerns.  Earlie Server, MD, PhD [facility] Hematology Oncology 07/13/2022   HISTORY OF PRESENTING ILLNESS:  Kurt Ewing 67 y.o. male presents to establish care for metastatic lung cancer.  Patient is a lifelong never smoker.  Oncology History  Metastatic non-small cell lung cancer ([place])  04/30/2022 Imaging    CT chest w contrast showed  1. Perihilar right upper lobe masslike opacity measuring 3.4 x 5.6 x 3.8 cm, This abuts the major fissure 2. Moderate-sized right pleural effusion which is partially loculated tracks anteriorly the lower hemithorax. Possible occasional pleural enhancement and thickening posteriorly. 3. Rounded masslike opacity in the dependent right lower lobe measuring 5.8 x 4.1 cm 4. Additional bilateral pulmonary nodules, largest measuring 7 mm in the right upper lobe. 5. Prominent mediastinal and right hilar lymph nodes.  6   Low-density 2.4 cm right adrenal nodule. This is unchanged from 12/15/2019 abdominal CT in typical of adenoma   05/05/2022 Procedure   status post ultrasound-guided right thoracentesis.  Cytology showed suspicious for malignancy, background lymphocyte predominant effusion.    05/13/2022 Imaging   PET scan initial staging showed 1. 5.9 by 3.5 cm right hilar mass with maximum SUV 5.7, compatible with malignancy. The epicenter of this mass appears to be in the right upper lobe. Faintly increased activity in the right hilar lymph node which could be involved. 2. Large right pleural effusion with pleural thickening and subtle accentuated activity along the pleural surface without a well-defined masslike pleural lesion. Correlate with recent thoracocentesis results. 3. Low-grade activity in the collapsed right lower lobe, maximum SUV 3.2, probably inflammatory. 4. No findings of malignancy outside of the chest. 5. 2.4 cm right adrenal adenoma does not require follow up. 6. 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 Lumbar spondylosis and degenerative disc disease. 7. Aortic atherosclerosis.   05/21/2022 Imaging   MRI brain with and without contrast showed multiple peripherally enhancing lesions  in the right middle cerebral hemispheres, concerning for metastatic disease.   05/29/2022 Initial Diagnosis   Metastatic non-small cell  lung cancer ([place])  02/25/22 patient was seen by PCP and was diagnosed with bronchitis, was treated with Azithromycin.  Cough did not improve. He took levaquin for 10 days without improvement either.   05/29/2022 status post microscopy biopsy. Right upper lobe biopsy-non-small cell carcinoma, favor adenocarcinoma. Right lower lobe biopsy-nondiagnostic reactive bronchial cells Right upper lobe ENB assisted FNA, positive for malignancy.  Non-small cell carcinoma, favor adenocarcinoma. Right upper lobe lung ENB with depression-positive for malignancy, non-small cell carcinoma is present Station 10R -positive for malignancy consistent with metastatic non-small cell carcinoma involving lymphoid material. Station 11 R-FNA showed suspicious for malignancy.  Single atypical epithelial groups in a hypocellular specimen Station 7-FNA showed positive for malignancy.  Consistent with metastatic non-small cell carcinoma involving lymphoid material   05/29/2022 Cancer Staging   Staging form: Lung, AJCC 8th Edition - Clinical stage from 05/29/2022: Stage IV (cT3, cN2, cM1) - Signed by Earlie Server, MD on 06/03/2022 Stage prefix: Initial diagnosis   06/22/2022 -  Chemotherapy   Cycle 1 LUNG Carboplatin (5) + Pemetrexed (500) + Pembrolizumab (200) D1 q21d Induction x 6 Cycles  Starting cycle 2, patient receives carboplatin and pemetrexed.  Pembrolizumab is held awaiting for NGS     07/07/2022 Procedure   Second opinion at [facility]  Repeat biopsy of the right pleura.  Pathology is positive for metastatic adenocarcinoma.  Pleural fluid cytology was positive.  Patient had right pleural catheter placed.    INTERVAL HISTORY Kurt Ewing is a 67 y.o. male who has above history reviewed by me today presents for follow up visit for metastatic lung non small cell lung cancer.    Patient was accompanied by wife He feels well, no shortness of breath at rest S/p cycle 1 chemotherapy. Tolerates well, denies nausea  vomiting diarrhea.  During the interval, patient had second opinion at [facility].  There he had a repeat biopsy of the right pleura.  Pathology is positive for metastatic adenocarcinoma.  Pleural fluid cytology was positive.  Patient had right pleural catheter placed.   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    SURGICAL HISTORY: Past Surgical History:  Procedure Laterality Date   COLONOSCOPY     IR IMAGING GUIDED PORT INSERTION  06/19/2022   KNEE ARTHROSCOPY Right 2005   TONSILLECTOMY  1970   VIDEO BRONCHOSCOPY WITH ENDOBRONCHIAL ULTRASOUND N/A 05/29/2022   Procedure: VIDEO BRONCHOSCOPY WITH ENDOBRONCHIAL ULTRASOUND;  Surgeon: Ottie Glazier, MD;  Location: [facility] ORS;  Service: Thoracic;  Laterality: N/A;    SOCIAL HISTORY: Social History   Socioeconomic History   Marital status: Married    Spouse name: [name]   Number of children: 3   Years of education: Not on file   Highest education level: Not on file  Occupational History   Not on file  Tobacco Use   Smoking status: Never   Smokeless tobacco: Never  Vaping Use   Vaping Use: Never used  Substance and Sexual Activity   Alcohol use: Yes    Comment: weekly   Drug use: Not Currently   Sexual activity: Not on file  Other Topics Concern   Not on file  Social History Narrative   Lives at home with wife [name]; 3 kids & 10 grandchildren  Social Determinants of Health   Financial Resource Strain: Not on file  Food Insecurity: Not on file  Transportation Needs: Not on file  Physical Activity: Not on file  Stress: Not on file  Social Connections: Not on file  Intimate Partner Violence: Not on file    FAMILY HISTORY: Family History  Problem Relation Age of Onset   Hypertension Father    Heart disease Father    Hypertension Brother    COPD Brother     ALLERGIES:  has No Known Allergies.  MEDICATIONS:   Current Outpatient Medications  Medication Sig Dispense Refill   acetaminophen (TYLENOL) 500 MG tablet Take 500 mg by mouth every 6 (six) hours as needed for mild pain.     dexamethasone (DECADRON) 2 MG tablet Take 1 tablet (2 mg total) by mouth 2 (two) times daily. 60 tablet 2   famotidine (PEPCID) 40 MG tablet Take 1 tablet by mouth at bedtime.     folic acid (FOLVITE) 1 MG tablet Take 1 tablet (1 mg total) by mouth daily. Start 7 days before pemetrexed chemotherapy. Continue until 21 days after pemetrexed completed. 100 tablet 3   lidocaine-prilocaine (EMLA) cream Apply 1 Application topically as needed. 30 g 2   lisinopril (ZESTRIL) 5 MG tablet Take 5 mg by mouth daily.     dexamethasone (DECADRON) 4 MG tablet Take 1 tab 2 times daily starting day before pemetrexed. Then take 2 tabs daily x 3 days starting day after carboplatin. Take with food. (Patient not taking: Reported on 07/13/2022) 30 tablet 1   meloxicam (MOBIC) 15 MG tablet Take 15 mg by mouth daily as needed. (Patient not taking: Reported on 06/29/2022)     ondansetron (ZOFRAN) 8 MG tablet Take 1 tablet (8 mg total) by mouth every 8 (eight) hours as needed for nausea or vomiting. Start on the third day after carboplatin. (Patient not taking: Reported on 06/29/2022) 30 tablet 1   prochlorperazine (COMPAZINE) 10 MG tablet Take 1 tablet (10 mg total) by mouth every 6 (six) hours as needed for nausea or vomiting. (Patient not taking: Reported on 06/29/2022) 30 tablet 1   No current facility-administered medications for this visit.    Review of Systems  Constitutional:  Negative for appetite change, chills, fatigue, fever and unexpected weight change.  HENT:   Negative for hearing loss and voice change.   Eyes:  Negative for eye problems and icterus.  Respiratory:  Positive for cough. Negative for chest tightness and shortness of breath.   Cardiovascular:  Negative for chest pain and leg swelling.  Gastrointestinal:  Negative for abdominal  distention and abdominal pain.  Endocrine: Negative for hot flashes.  Genitourinary:  Negative for difficulty urinating, dysuria and frequency.   Musculoskeletal:  Negative for arthralgias.  Skin:  Negative for itching and rash.  Neurological:  Negative for light-headedness and numbness.  Hematological:  Negative for adenopathy. Does not bruise/bleed easily.  Psychiatric/Behavioral:  Negative for confusion.      PHYSICAL EXAMINATION: ECOG PERFORMANCE STATUS: 0 - Asymptomatic  Vitals:   07/13/22 0843  BP: (!) 142/98  Pulse: 66  Resp: 18  Temp: (!) 97.3 F (36.3 C)  SpO2: 98%   Filed Weights   07/13/22 0843  Weight: 195 lb (88.5 kg)    Physical Exam Constitutional:      General: He is not in acute distress.    Appearance: He is not diaphoretic.  HENT:     Head: Normocephalic and atraumatic.  Eyes:  General: No scleral icterus. Cardiovascular:     Rate and Rhythm: Normal rate.     Heart sounds: No murmur heard. Pulmonary:     Effort: Pulmonary effort is normal. No respiratory distress.     Comments: Decreased right lower lung breath sound.  Right pleural catheter. Abdominal:     General: Bowel sounds are normal. There is no distension.     Palpations: Abdomen is soft.  Musculoskeletal:        General: Normal range of motion.     Cervical back: Normal range of motion.  Skin:    General: Skin is warm and dry.     Findings: No erythema.  Neurological:     Mental Status: He is alert and oriented to person, place, and time. Mental status is at baseline.     Cranial Nerves: No cranial nerve deficit.     Motor: No abnormal muscle tone.  Psychiatric:        Mood and Affect: Affect normal.      LABORATORY DATA:  I have reviewed the data as listed    Latest Ref Rng & Units 07/13/2022    8:31 AM 06/29/2022    8:41 AM 06/22/2022   12:50 PM  CBC  WBC 4.0 - 10.5 K/uL 7.8  6.0  12.9   Hemoglobin 13.0 - 17.0 g/dL 13.1  13.7  14.5   Hematocrit 39.0 - 52.0 % 38.8  41.3   43.4   Platelets 150 - 400 K/uL 266  179  265       Latest Ref Rng & Units 07/13/2022    8:31 AM 06/29/2022    8:41 AM 06/22/2022   12:50 PM  CMP  Glucose 70 - 99 mg/dL 105  101  124   BUN 8 - 23 mg/dL 22  28  29    Creatinine 0.61 - 1.24 mg/dL 1.02  0.99  0.99   Sodium 135 - 145 mmol/L 137  136  136   Potassium 3.5 - 5.1 mmol/L 4.1  4.6  4.5   Chloride 98 - 111 mmol/L 101  100  103   CO2 22 - 32 mmol/L 25  27  23    Calcium 8.9 - 10.3 mg/dL 8.8  8.6  8.7   Total Protein 6.5 - 8.1 g/dL 6.9  6.9  7.0   Total Bilirubin 0.3 - 1.2 mg/dL 0.7  1.2  1.0   Alkaline Phos 38 - 126 U/L 58  70  62   AST 15 - 41 U/L 20  19  18    ALT 0 - 44 U/L 22  20  21       RADIOGRAPHIC STUDIES: I have personally reviewed the radiological images as listed and agreed with the findings in the report. IR IMAGING GUIDED PORT INSERTION  Result Date: 06/19/2022 CLINICAL DATA:  Metastatic lung carcinoma, needs durable venous access for planned treatment regimen EXAM: TUNNELED PORT CATHETER PLACEMENT WITH ULTRASOUND AND FLUOROSCOPIC GUIDANCE FLUOROSCOPY: Radiation Exposure Index (as provided by the fluoroscopic device): 2 mGy air Kerma ANESTHESIA/SEDATION: Intravenous Fentanyl 134mcg and Versed 2mg  were administered as conscious sedation during continuous monitoring of the patient's level of consciousness and physiological / cardiorespiratory status by the radiology RN, with a total moderate sedation time of 22 minutes. TECHNIQUE: The procedure, risks, benefits, and alternatives were explained to the patient. Questions regarding the procedure were encouraged and answered. The patient understands and consents to the procedure. Patency of the right IJ vein was confirmed with ultrasound with image documentation.  An appropriate skin site was determined. Skin site was marked. Region was prepped using maximum barrier technique including cap and mask, sterile gown, sterile gloves, large sterile sheet, and Chlorhexidine as cutaneous  antisepsis. The region was infiltrated locally with 1% lidocaine. Under real-time ultrasound guidance, the right IJ vein was accessed with a 21 gauge micropuncture needle; the needle tip within the vein was confirmed with ultrasound image documentation. Needle was exchanged over a 018 guidewire for transitional dilator, and vascular measurement was performed. A small incision was made on the right anterior chest wall and a subcutaneous pocket fashioned. The power-injectable port was positioned and its catheter tunneled to the right IJ dermatotomy site. The transitional dilator was exchanged over an Amplatz wire for a peel-away sheath, through which the port catheter, which had been trimmed to the appropriate length, was advanced and positioned under fluoroscopy with its tip at the cavoatrial junction. Spot chest radiograph confirms good catheter position and no pneumothorax. The port was flushed per protocol. The pocket was closed with deep interrupted and subcuticular continuous 3-0 Monocryl sutures. The incisions were covered with Dermabond then covered with a sterile dressing. The patient tolerated the procedure well. COMPLICATIONS: COMPLICATIONS None immediate IMPRESSION: Technically successful right IJ power-injectable port catheter placement. Ready for routine use. Electronically Signed   By: Lucrezia Europe M.D.   On: 06/19/2022 17:51

## 2022-07-13 NOTE — Assessment & Plan Note (Signed)
Status post paracentesis at [facility].  Status post pleural catheter placement.

## 2022-07-13 NOTE — Assessment & Plan Note (Addendum)
Imaging results and pathology results were reviewed and discussed with patient.  The diagnosis of stage IV non-small cell lung cancer and care plan were discussed with patient in detail.  The goal of adjuvant chemotherapy treatment is with palliative intent  Not enough biopsy tissue for NGS   Liquid biopsy showed no targetable mutation Labs are reviewed and discussed with patient. [facility] recommendation was reviewed and discussed with patient. Proceed with carboplatin Alimta, hold Keytruda, awaiting NGS on the second biopsy done at [facility].

## 2022-07-13 NOTE — Assessment & Plan Note (Signed)
Treatment plan as discussed above.  

## 2022-07-15 ENCOUNTER — Encounter: Payer: Self-pay | Admitting: Radiation Oncology

## 2022-07-15 ENCOUNTER — Ambulatory Visit
Admission: RE | Admit: 2022-07-15 | Discharge: 2022-07-15 | Disposition: A | Discharge status: Home / Self Care | Payer: Medicare Other | Source: Ambulatory Visit | Attending: Radiation Oncology | Admitting: Radiation Oncology

## 2022-07-15 VITALS — BP 127/78 | HR 50 | Temp 96.5°F | Resp 16 | Ht 70.0 in | Wt 188.0 lb

## 2022-07-15 DIAGNOSIS — C7931 Secondary malignant neoplasm of brain: Secondary | ICD-10-CM | POA: Insufficient documentation

## 2022-07-15 DIAGNOSIS — Z923 Personal history of irradiation: Secondary | ICD-10-CM | POA: Diagnosis not present

## 2022-07-15 DIAGNOSIS — C3411 Malignant neoplasm of upper lobe, right bronchus or lung: Secondary | ICD-10-CM | POA: Diagnosis not present

## 2022-07-15 NOTE — Progress Notes (Signed)
Radiation Oncology Follow up Note  Name: Kurt Ewing   Date:   07/15/2022 MRN:  [id number] DOB: 05-23-1955    This 67 y.o. male presents to the clinic today for 1 month follow-up status post whole brain radiation therapy for at least 13 brain lesions and patient with primary lung cancer.  REFERRING PROVIDER: Rusty Aus, MD  HPI: Patient is a 67 year old male now out 1 month completed whole brain radiation therapy for at least 13 lesions consistent with metastatic disease 5.9 cm right hilar and right upper lobe lung cancer.Marland Kitchen  He is currently being treated with systemic therapy through medical oncology with carboplatinum Pembro andPemetrexed.  He is tolerating that well.  His recent CT scan shows stable findings in the chest.  He is having no cough hemoptysis or chest tightness.  He is having no significant neurologic complications he is still on 2 mg of Decadron daily which I am going to taper him off of that.  He is having no change in visual fields and no focal neurologic deficits.  COMPLICATIONS OF TREATMENT: none  FOLLOW UP COMPLIANCE: keeps appointments   PHYSICAL EXAM:  BP 127/78   Pulse (!) 50   Temp (!) 96.5 F (35.8 C)   Resp 16   Ht 5\' 10"  (1.778 m)   Wt 188 lb (85.3 kg)   BMI 26.98 kg/m  Well-developed well-nourished patient in NAD. HEENT reveals PERLA, EOMI, discs not visualized.  Oral cavity is clear. No oral mucosal lesions are identified. Neck is clear without evidence of cervical or supraclavicular adenopathy. Lungs are clear to A&P. Cardiac examination is essentially unremarkable with regular rate and rhythm without murmur rub or thrill. Abdomen is benign with no organomegaly or masses noted. Motor sensory and DTR levels are equal and symmetric in the upper and lower extremities. Cranial nerves II through XII are grossly intact. Proprioception is intact. No peripheral adenopathy or edema is identified. No motor or sensory levels are noted. Crude visual fields are  within normal range.  RADIOLOGY RESULTS: Recent CT scans reviewed  PLAN: At this time I would reserve radiation therapy for palliation to his chest should we get progressive disease hemoptysis further atelectasis.  I explained that to the patient I have otherwise asked to see him back in 3 to 4 months for follow-up.  Will be happy to evaluate him anytime should palliation to the chest be deemed appropriate.  I would like to take this opportunity to thank you for allowing me to participate in the care of your patient.Noreene Filbert, MD

## 2022-07-17 ENCOUNTER — Encounter: Payer: Self-pay | Admitting: *Deleted

## 2022-07-17 NOTE — Telephone Encounter (Signed)
Please contact pt to set up appt on Monday with Lauren. She will remove his stitches.

## 2022-07-20 ENCOUNTER — Telehealth: Payer: Self-pay | Admitting: *Deleted

## 2022-07-20 ENCOUNTER — Inpatient Hospital Stay: Payer: Medicare Other | Admitting: Nurse Practitioner

## 2022-07-20 NOTE — Telephone Encounter (Signed)
Rn spoke with patient. Patient cnl today's apt. He already had stitches removed on Friday at pcp office and pcp also gave pt antibiotics.

## 2022-07-23 ENCOUNTER — Other Ambulatory Visit: Payer: Self-pay

## 2022-07-23 ENCOUNTER — Inpatient Hospital Stay
Admission: EM | Admit: 2022-07-23 | Discharge: 2022-07-25 | DRG: 920 | Disposition: A | Discharge status: Home / Self Care | Payer: Medicare Other | Attending: Internal Medicine | Admitting: Internal Medicine

## 2022-07-23 ENCOUNTER — Emergency Department: Payer: Medicare Other

## 2022-07-23 DIAGNOSIS — I700 Atherosclerosis of aorta: Secondary | ICD-10-CM | POA: Diagnosis present

## 2022-07-23 DIAGNOSIS — T8579XA Infection and inflammatory reaction due to other internal prosthetic devices, implants and grafts, initial encounter: Principal | ICD-10-CM | POA: Diagnosis present

## 2022-07-23 DIAGNOSIS — D696 Thrombocytopenia, unspecified: Secondary | ICD-10-CM | POA: Insufficient documentation

## 2022-07-23 DIAGNOSIS — C3490 Malignant neoplasm of unspecified part of unspecified bronchus or lung: Secondary | ICD-10-CM | POA: Diagnosis present

## 2022-07-23 DIAGNOSIS — Z9221 Personal history of antineoplastic chemotherapy: Secondary | ICD-10-CM

## 2022-07-23 DIAGNOSIS — Z8249 Family history of ischemic heart disease and other diseases of the circulatory system: Secondary | ICD-10-CM

## 2022-07-23 DIAGNOSIS — T859XXA Unspecified complication of internal prosthetic device, implant and graft, initial encounter: Secondary | ICD-10-CM

## 2022-07-23 DIAGNOSIS — E871 Hypo-osmolality and hyponatremia: Secondary | ICD-10-CM | POA: Insufficient documentation

## 2022-07-23 DIAGNOSIS — L03313 Cellulitis of chest wall: Secondary | ICD-10-CM | POA: Diagnosis not present

## 2022-07-23 DIAGNOSIS — C3491 Malignant neoplasm of unspecified part of right bronchus or lung: Secondary | ICD-10-CM | POA: Diagnosis present

## 2022-07-23 DIAGNOSIS — R918 Other nonspecific abnormal finding of lung field: Secondary | ICD-10-CM | POA: Diagnosis present

## 2022-07-23 DIAGNOSIS — Y831 Surgical operation with implant of artificial internal device as the cause of abnormal reaction of the patient, or of later complication, without mention of misadventure at the time of the procedure: Secondary | ICD-10-CM | POA: Diagnosis present

## 2022-07-23 DIAGNOSIS — Z79899 Other long term (current) drug therapy: Secondary | ICD-10-CM

## 2022-07-23 DIAGNOSIS — J910 Malignant pleural effusion: Secondary | ICD-10-CM | POA: Diagnosis present

## 2022-07-23 DIAGNOSIS — L7682 Other postprocedural complications of skin and subcutaneous tissue: Secondary | ICD-10-CM

## 2022-07-23 DIAGNOSIS — J9811 Atelectasis: Secondary | ICD-10-CM | POA: Diagnosis present

## 2022-07-23 DIAGNOSIS — I252 Old myocardial infarction: Secondary | ICD-10-CM

## 2022-07-23 DIAGNOSIS — L0390 Cellulitis, unspecified: Secondary | ICD-10-CM | POA: Diagnosis present

## 2022-07-23 DIAGNOSIS — L0291 Cutaneous abscess, unspecified: Principal | ICD-10-CM

## 2022-07-23 DIAGNOSIS — C7931 Secondary malignant neoplasm of brain: Secondary | ICD-10-CM | POA: Diagnosis present

## 2022-07-23 DIAGNOSIS — I10 Essential (primary) hypertension: Secondary | ICD-10-CM | POA: Diagnosis present

## 2022-07-23 HISTORY — DX: Malignant (primary) neoplasm, unspecified: C80.1

## 2022-07-23 LAB — CBC
HCT: 36 % — ABNORMAL LOW (ref 39.0–52.0)
Hemoglobin: 12.1 g/dL — ABNORMAL LOW (ref 13.0–17.0)
MCH: 30.9 pg (ref 26.0–34.0)
MCHC: 33.6 g/dL (ref 30.0–36.0)
MCV: 92.1 fL (ref 80.0–100.0)
Platelets: 105 10*3/uL — ABNORMAL LOW (ref 150–400)
RBC: 3.91 MIL/uL — ABNORMAL LOW (ref 4.22–5.81)
RDW: 13.3 % (ref 11.5–15.5)
WBC: 4.4 10*3/uL (ref 4.0–10.5)
nRBC: 0 % (ref 0.0–0.2)

## 2022-07-23 LAB — BASIC METABOLIC PANEL
Anion gap: 8 (ref 5–15)
BUN: 25 mg/dL — ABNORMAL HIGH (ref 8–23)
CO2: 25 mmol/L (ref 22–32)
Calcium: 8.3 mg/dL — ABNORMAL LOW (ref 8.9–10.3)
Chloride: 100 mmol/L (ref 98–111)
Creatinine, Ser: 0.94 mg/dL (ref 0.61–1.24)
GFR, Estimated: >60 mL/min (ref >60)
Glucose, Bld: 98 mg/dL (ref 70–99)
Potassium: 4.2 mmol/L (ref 3.5–5.1)
Sodium: 133 mmol/L — ABNORMAL LOW (ref 135–145)

## 2022-07-23 MED ORDER — SODIUM CHLORIDE 0.9 % IV SOLN
2.0000 g | Freq: Once | INTRAVENOUS | Status: AC
  Administered 2022-07-23: 2 g via INTRAVENOUS
  Filled 2022-07-23: qty 12.5

## 2022-07-23 MED ORDER — IOHEXOL 300 MG/ML  SOLN
75.0000 mL | Freq: Once | INTRAMUSCULAR | Status: AC | PRN
  Administered 2022-07-23: 75 mL via INTRAVENOUS

## 2022-07-23 MED ORDER — VANCOMYCIN HCL IN DEXTROSE 1-5 GM/200ML-% IV SOLN
1000.0000 mg | Freq: Once | INTRAVENOUS | Status: AC
  Administered 2022-07-23: 1000 mg via INTRAVENOUS
  Filled 2022-07-23: qty 200

## 2022-07-23 NOTE — ED Triage Notes (Signed)
Pt to ED for possible infection to drain on right chest. Reports pus around area. States had drain placed 2 weeks ago for pleural effusion. Redness and pus noted around area. Currently on antibiotic

## 2022-07-23 NOTE — H&P (Signed)
History and Physical    Patient: Kurt Ewing [id number] DOB: 09/21/1955 DOA: 07/23/2022 DOS: the patient was seen and examined on 07/23/2022 PCP: Rusty Aus, MD  Patient coming from: Home  Chief Complaint: No chief complaint on file.   HPI: Kurt Ewing is a 67 y.o. male with medical history significant for Stage IV right upper lobe adenocarcinoma, metastatic to brain, diagnosed in January 2024 on chemo biotherapy starting 2/26, s/p right-sided thoracoscopy and pleural biopsy with pleural catheter placement on 07/07/2022 at the [facility], with suture removal on 3/22 at [place] [facility] with noted redness for which he was prescribed Keflex, who presents to the ED, after he noted pus coming out of the incision site today while doing a dressing change.  He states that he cannot and used to drain over 150 mL at night.  He has had no chest pain, cough or fever. ED course and data review: Vitals in the ED unremarkable.  WBC 4400 and CBC and BMP overall unremarkableCT chest with con.  6 showing decreasing right pleural effusion, right chest tube in place with no acute findings as further detailed below: IMPRESSION: 1. Decreasing small right pleural effusion with pleural thickening. Right chest tube is in place. No soft tissue abscess identified. 2. Suprahilar right lung mass is unchanged since prior study. Additional mass or consolidation in the right lung base is also unchanged. Atelectasis in the right base. 3. Additional right pulmonary nodules are likely metastatic. Subcentimeter left pulmonary nodules, some of which are calcified, likely postinflammatory. 4. Cardiac enlargement.  Aortic atherosclerosis  Patient started on vancomycin and cefepime.  The ED provider spoke with oncologist Dr. Tasia Catchings who recommended IR consult to replace drain.   hospitalist consulted for admission.   Review of Systems: As mentioned in the history of present illness.  All other systems reviewed and are negative.  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 He has never used smokeless tobacco. He reports current alcohol use. He reports that he does not currently use drugs.  No Known Allergies  Family History  Problem Relation Age of Onset   Hypertension Father    Heart disease Father    Hypertension Brother    COPD Brother     Prior to Admission medications   Medication Sig Start Date End Date Taking? Authorizing Provider  acetaminophen (TYLENOL) 500 MG tablet Take 500 mg by mouth every 6 (six) hours as needed for mild pain.    [provider]  cephALEXin (KEFLEX) 500 MG capsule Take 500 mg by mouth 4 times daily for 7 days. 07/17/22 07/24/22  [provider]  dexamethasone (DECADRON) 2 MG tablet Take 1 tablet (2 mg total) by mouth 2 (two) times daily. Patient not taking: Reported on 07/15/2022 05/27/22   Noreene Filbert, MD  dexamethasone (DECADRON) 4 MG tablet Take 1 tab 2 times daily starting day before pemetrexed.  Then take 2 tabs daily x 3 days starting day after carboplatin. Take with food. Patient not taking: Reported on 07/13/2022 06/15/22   Earlie Server, MD  famotidine (PEPCID) 40 MG tablet Take 1 tablet by mouth at bedtime. 05/12/22   [provider]  folic acid (FOLVITE) 1 MG tablet Take 1 tablet (1 mg total) by mouth daily. Start 7 days before pemetrexed chemotherapy. Continue until 21 days  after pemetrexed completed. 06/15/22   Earlie Server, MD  lidocaine-prilocaine (EMLA) cream Apply 1 Application topically as needed. 06/16/22   Earlie Server, MD  lisinopril (ZESTRIL) 5 MG tablet Take 5 mg by mouth daily.    [provider]  melatonin 3 MG TABS tablet Take by mouth.    [provider]  meloxicam (MOBIC) 15 MG tablet Take 15 mg by mouth daily as needed. Patient not taking: Reported on 06/29/2022    [provider]  ondansetron (ZOFRAN) 8 MG tablet Take 1 tablet (8 mg total) by mouth every 8 (eight) hours as needed for nausea or vomiting. Start on the third day after carboplatin. Patient not taking: Reported on 06/29/2022 06/15/22   Earlie Server, MD  prochlorperazine (COMPAZINE) 10 MG tablet Take 1 tablet (10 mg total) by mouth every 6 (six) hours as needed for nausea or vomiting. Patient not taking: Reported on 06/29/2022 06/15/22   Earlie Server, MD    Physical Exam: Vitals:   07/23/22 2100 07/23/22 2110 07/23/22 2338 07/23/22 2339  BP: 124/78  (!) 111/58   Pulse: (!) 53 (!) 54 (!) 56   Resp:   18   Temp:    98 F (36.7 C)  TempSrc:    Oral  SpO2: 99% 98% 99%   Height:       Physical Exam Vitals and nursing note reviewed.  Constitutional:      General: He is not in acute distress. HENT:     Head: Normocephalic and atraumatic.  Cardiovascular:     Rate and Rhythm: Normal rate and regular rhythm.     Heart sounds: Normal heart sounds.  Pulmonary:     Effort: Pulmonary effort is normal.     Breath sounds: Normal breath sounds.  Chest:     Comments: Pleural catheter right chest wall.  Please see picture below Abdominal:     Palpations: Abdomen is soft.     Tenderness: There is no abdominal tenderness.  Neurological:     Mental Status: Mental status is at baseline.     Labs on Admission: I have personally reviewed following labs and imaging studies  CBC: Recent Labs  Lab 07/23/22 1819  WBC 4.4  HGB 12.1*  HCT 36.0*  MCV 92.1  PLT 105*   Basic  Metabolic Panel: Recent Labs  Lab 07/23/22 1819  NA 133*  K 4.2  CL 100  CO2 25  GLUCOSE 98  BUN 25*  CREATININE 0.94  CALCIUM 8.3*   GFR: Estimated Creatinine Clearance: 77.7 mL/min (by C-G formula based on SCr of 0.94 mg/dL). Liver Function Tests: No results for input(s): "AST", "ALT", "ALKPHOS", "BILITOT", "PROT", "ALBUMIN" in the last 168 hours. No results for input(s): "LIPASE", "AMYLASE" in the last 168 hours. No results for input(s): "AMMONIA" in the last 168 hours. Coagulation Profile: No results for input(s): "INR", "PROTIME" in the last 168 hours. Cardiac Enzymes: No results for input(s): "CKTOTAL", "CKMB", "CKMBINDEX", "TROPONINI" in the last 168 hours. BNP (last 3 results) No results for input(s): "PROBNP" in the last 8760 hours. HbA1C: No  results for input(s): "HGBA1C" in the last 72 hours. CBG: No results for input(s): "GLUCAP" in the last 168 hours. Lipid Profile: No results for input(s): "CHOL", "HDL", "LDLCALC", "TRIG", "CHOLHDL", "LDLDIRECT" in the last 72 hours. Thyroid Function Tests: No results for input(s): "TSH", "T4TOTAL", "FREET4", "T3FREE", "THYROIDAB" in the last 72 hours. Anemia Panel: No results for input(s): "VITAMINB12", "FOLATE", "FERRITIN", "TIBC", "IRON", "RETICCTPCT" in the last 72 hours. Urine analysis: No results found for: "COLORURINE", "APPEARANCEUR", "LABSPEC", "PHURINE", "GLUCOSEU", "HGBUR", "BILIRUBINUR", "KETONESUR", "PROTEINUR", "UROBILINOGEN", "NITRITE", "LEUKOCYTESUR"  Radiological Exams on Admission: CT Chest W Contrast  Result Date: 07/23/2022 CLINICAL DATA:  Right-sided infection around site of chest tube. Drain was placed 2 weeks ago for pleural effusion. EXAM: CT CHEST WITH CONTRAST TECHNIQUE: Multidetector CT imaging of the chest was performed during intravenous contrast administration. RADIATION DOSE REDUCTION: This exam was performed according to the departmental dose-optimization program which includes automated exposure  control, adjustment of the mA and/or kV according to patient size and/or use of iterative reconstruction technique. CONTRAST:  68mL OMNIPAQUE IOHEXOL 300 MG/ML  SOLN COMPARISON:  Chest radiograph 05/29/2022. CT chest 05/26/2022 and 04/30/2022 FINDINGS: Cardiovascular: Cardiac enlargement with prominence of the right heart and left atrium. No pericardial effusions. Coronary artery and aortic calcification. Normal caliber thoracic aorta. No dissection. Mediastinum/Nodes: Subcentimeter low-attenuation lesions in the right and left thyroid gland. No change since prior studies. No imaging follow-up is indicated. Esophagus is decompressed. No significant lymphadenopathy. Right-sided Infuse-A-Port with tip in the low SVC. Lungs/Pleura: Small right pleural effusion with pleural thickening, possibly loculated. A right chest drain is in place centrally within the collection. Significant decrease in size of the collection since the prior studies. Emphysematous changes in the lungs. Atelectasis and scarring in the right base. Mass or consolidation in the right lower lung measuring 4.3 x 6.2 cm. Right suprahilar mass lesion measuring 4.3 x 5 cm. Right upper lung pulmonary nodule measuring 8 mm diameter. Several right upper lung ground-glass nodules, largest measuring 6 mm. Mass lesions and nodules are similar to prior study although better seen today with smaller effusion. Multiple left upper lung nodules and superior segment left lower lung nodule. Largest measures 4 mm diameter. Some of these nodules appear calcified and may be postinflammatory. Upper Abdomen: No acute abnormalities are demonstrated. Musculoskeletal: Degenerative changes in the spine. No destructive bone lesions. IMPRESSION: 1. Decreasing small right pleural effusion with pleural thickening. Right chest tube is in place. No soft tissue abscess identified. 2. Suprahilar right lung mass is unchanged since prior study. Additional mass or consolidation in the  right lung base is also unchanged. Atelectasis in the right base. 3. Additional right pulmonary nodules are likely metastatic. Subcentimeter left pulmonary nodules, some of which are calcified, likely postinflammatory. 4. Cardiac enlargement.  Aortic atherosclerosis. Electronically Signed   By: Lucienne Capers M.D.   On: 07/23/2022 20:41     Data Reviewed: Relevant notes from primary care and specialist visits, past discharge summaries as available in EHR, including Care Everywhere. Prior diagnostic testing as pertinent to current admission diagnoses Updated medications and problem lists for reconciliation ED course, including vitals, labs, imaging, treatment and response to treatment Triage notes, nursing and pharmacy notes and ED provider's notes Notable results as noted in HPI   Assessment and Plan: * Cellulitis and drainage from chest wall at site of pleural catheter, placed 07/07/22 CT chest shows no soft tissue abscess and proper placement of right chest tube No improvement with Keflex started on 3/22 Culture drainage Continue vancomycin and  cefepime Catheter continues to drain over 150 cc nightly ED provider spoke with oncologist, Dr. Tasia Catchings who recommended IR consult to evaluate drain and replace if needed. IR consulted  HTN (hypertension) Continue lisinopril  Metastatic non-small cell lung cancer ([place]) Malignant pleural effusion CT showing decreasing right pleural effusion and right chest tube in place Patient followed by Dr. Tasia Catchings On 3/18 received Carboplatin (5) + Pemetrexed (500) + Pembrolizumab (200) D1 q21d Induction x 6 Cycles  Day 1, Cycle 2    DVT prophylaxis: SCD  Consults: IR  Advance Care Planning:   Code Status: Prior   Family Communication: none  Disposition Plan: Back to previous home environment  Severity of Illness: The appropriate patient status for this patient is OBSERVATION. Observation status is judged to be reasonable and necessary in order to  provide the required intensity of service to ensure the patient's safety. The patient's presenting symptoms, physical exam findings, and initial radiographic and laboratory data in the context of their medical condition is felt to place them at decreased risk for further clinical deterioration. Furthermore, it is anticipated that the patient will be medically stable for discharge from the hospital within 2 midnights of admission.   Author: Athena Masse, MD 07/23/2022 11:45 PM  For on call review [email or web address].

## 2022-07-23 NOTE — ED Notes (Signed)
Pt currently on chemo

## 2022-07-23 NOTE — ED Provider Notes (Signed)
[facility] Provider Note    Event Date/Time   First MD Initiated Contact with Patient 07/23/22 1850     (approximate)   History   Infection   HPI  Kurt Ewing is a 67 y.o. male who presents to the emergency department today because of concerns for infection at site of chest tube insertion.  The patient states that he had a chest tube inserted a couple of weeks ago at [facility] in [place] secondary to pleural effusion due to cancer.  A week ago he had his sutures removed by his primary care doctor's office.  There was some redness noted around it so he was started on oral antibiotics.  Today when he was doing a dressing change he noticed pus coming out at the insertion site.  He does state that it is still been draining greater than 150 cc at night.  He denies any fevers.     Physical Exam   Triage Vital Signs: ED Triage Vitals  Enc Vitals Group     BP 07/23/22 1813 (!) 117/90     Pulse Rate 07/23/22 1813 73     Resp 07/23/22 1813 18     Temp 07/23/22 1813 98.2 F (36.8 C)     Temp src --      SpO2 07/23/22 1813 95 %     Weight --      Height 07/23/22 1815 5\' 10"  (1.778 m)     Head Circumference --      Peak Flow --      Pain Score 07/23/22 1814 2     Pain Loc --      Pain Edu? --      Excl. in [place]? --     Most recent vital signs: Vitals:   07/23/22 1813  BP: (!) 117/90  Pulse: 73  Resp: 18  Temp: 98.2 F (36.8 C)  SpO2: 95%   General: Awake, alert, oriented. CV:  Good peripheral perfusion.  Resp:  Normal effort.  Abd:  No distention.  Other:  Chest tube to right chest. At insertion site some redness, with palpation of skin around insert site frank pus was elicited.    ED Results / Procedures / Treatments   Labs (all labs ordered are listed, but only abnormal results are displayed) Labs Reviewed  CBC - Abnormal; Notable for the following components:      Result Value   RBC 3.91 (*)    Hemoglobin 12.1 (*)    HCT 36.0 (*)     Platelets 105 (*)    All other components within normal limits  BASIC METABOLIC PANEL - Abnormal; Notable for the following components:   Sodium 133 (*)    BUN 25 (*)    Calcium 8.3 (*)    All other components within normal limits     EKG  None   RADIOLOGY  I independently interpreted and visualized the CT chest. My interpretation: pleural effusion. Chest tube in place. No large subcutaneous fluid collection Radiology interpretation:  IMPRESSION:  1. Decreasing small right pleural effusion with pleural thickening.  Right chest tube is in place. No soft tissue abscess identified.  2. Suprahilar right lung mass is unchanged since prior study.  Additional mass or consolidation in the right lung base is also  unchanged. Atelectasis in the right base.  3. Additional right pulmonary nodules are likely metastatic.  Subcentimeter left pulmonary nodules, some of which are calcified,  likely postinflammatory.  4. Cardiac  enlargement.  Aortic atherosclerosis.      PROCEDURES:  Critical Care performed: No   MEDICATIONS ORDERED IN ED: Medications - No data to display   IMPRESSION / MDM / [place] / ED COURSE  I reviewed the triage vital signs and the nursing notes.                              Differential diagnosis includes, but is not limited to, abscess, cellulitis, empyema.   Patient's presentation is most consistent with acute presentation with potential threat to life or bodily function.  Patient presented to the emergency department today because of concerns for infection around site of insertion of the chest tube.  On exam patient did have pus elicited with palpation of the skin around the insertion site.  Patient was afebrile.  Blood work without concerning leukocytosis.  CT chest without obvious subcutaneous fluid collection or findings concerning for empyema.  At this time I do think likely more superficial infection.  Dr. Janese Banks with oncology did  recommend admission for IV antibiotics and possible discussion for tube replacement.  Discussed this with the patient.  Discussed with Dr. Damita Dunnings with the hospitalist service who will plan on admission.   FINAL CLINICAL IMPRESSION(S) / ED DIAGNOSES   Final diagnoses:  Abscess    Note:  This document was prepared using Dragon voice recognition software and may include unintentional dictation errors.     Nance Pear, MD 07/23/22 2325

## 2022-07-23 NOTE — ED Notes (Signed)
Patient transported to CT 

## 2022-07-23 NOTE — Assessment & Plan Note (Addendum)
Malignant pleural effusion

## 2022-07-23 NOTE — Assessment & Plan Note (Signed)
Continue lisinopril

## 2022-07-23 NOTE — ED Notes (Signed)
Pt back in room.

## 2022-07-23 NOTE — ED Notes (Signed)
Pt rpts hx of bradycardia.

## 2022-07-24 ENCOUNTER — Other Ambulatory Visit (HOSPITAL_COMMUNITY): Payer: Self-pay

## 2022-07-24 ENCOUNTER — Encounter: Payer: Self-pay | Admitting: Internal Medicine

## 2022-07-24 ENCOUNTER — Inpatient Hospital Stay: Payer: Medicare Other | Admitting: Radiology

## 2022-07-24 DIAGNOSIS — I252 Old myocardial infarction: Secondary | ICD-10-CM | POA: Diagnosis not present

## 2022-07-24 DIAGNOSIS — J910 Malignant pleural effusion: Secondary | ICD-10-CM

## 2022-07-24 DIAGNOSIS — Z79899 Other long term (current) drug therapy: Secondary | ICD-10-CM | POA: Diagnosis not present

## 2022-07-24 DIAGNOSIS — D696 Thrombocytopenia, unspecified: Secondary | ICD-10-CM

## 2022-07-24 DIAGNOSIS — I10 Essential (primary) hypertension: Secondary | ICD-10-CM | POA: Diagnosis present

## 2022-07-24 DIAGNOSIS — L0390 Cellulitis, unspecified: Secondary | ICD-10-CM | POA: Diagnosis present

## 2022-07-24 DIAGNOSIS — I700 Atherosclerosis of aorta: Secondary | ICD-10-CM | POA: Diagnosis present

## 2022-07-24 DIAGNOSIS — Y831 Surgical operation with implant of artificial internal device as the cause of abnormal reaction of the patient, or of later complication, without mention of misadventure at the time of the procedure: Secondary | ICD-10-CM | POA: Diagnosis present

## 2022-07-24 DIAGNOSIS — J9811 Atelectasis: Secondary | ICD-10-CM | POA: Diagnosis present

## 2022-07-24 DIAGNOSIS — C7931 Secondary malignant neoplasm of brain: Secondary | ICD-10-CM | POA: Diagnosis present

## 2022-07-24 DIAGNOSIS — C3490 Malignant neoplasm of unspecified part of unspecified bronchus or lung: Secondary | ICD-10-CM | POA: Diagnosis not present

## 2022-07-24 DIAGNOSIS — E871 Hypo-osmolality and hyponatremia: Secondary | ICD-10-CM | POA: Insufficient documentation

## 2022-07-24 DIAGNOSIS — L7682 Other postprocedural complications of skin and subcutaneous tissue: Secondary | ICD-10-CM | POA: Diagnosis not present

## 2022-07-24 DIAGNOSIS — C3491 Malignant neoplasm of unspecified part of right bronchus or lung: Secondary | ICD-10-CM | POA: Diagnosis present

## 2022-07-24 DIAGNOSIS — T8579XA Infection and inflammatory reaction due to other internal prosthetic devices, implants and grafts, initial encounter: Secondary | ICD-10-CM | POA: Diagnosis present

## 2022-07-24 DIAGNOSIS — Z9221 Personal history of antineoplastic chemotherapy: Secondary | ICD-10-CM | POA: Diagnosis not present

## 2022-07-24 DIAGNOSIS — L03313 Cellulitis of chest wall: Secondary | ICD-10-CM | POA: Diagnosis present

## 2022-07-24 DIAGNOSIS — Z8249 Family history of ischemic heart disease and other diseases of the circulatory system: Secondary | ICD-10-CM | POA: Diagnosis not present

## 2022-07-24 DIAGNOSIS — R918 Other nonspecific abnormal finding of lung field: Secondary | ICD-10-CM | POA: Diagnosis present

## 2022-07-24 HISTORY — PX: IR RADIOLOGIST EVAL & MGMT: IMG5224

## 2022-07-24 HISTORY — PX: IR REMOVAL OF PLURAL CATH W/CUFF: IMG5346

## 2022-07-24 LAB — BODY FLUID CELL COUNT WITH DIFFERENTIAL
Eos, Fluid: 0 %
Lymphs, Fluid: 83 %
Monocyte-Macrophage-Serous Fluid: 17 %
Neutrophil Count, Fluid: 0 %
Total Nucleated Cell Count, Fluid: 493 cu mm

## 2022-07-24 LAB — HIV ANTIBODY (ROUTINE TESTING W REFLEX): HIV Screen 4th Generation wRfx: NONREACTIVE

## 2022-07-24 LAB — PATHOLOGIST SMEAR REVIEW

## 2022-07-24 MED ORDER — LISINOPRIL 10 MG PO TABS
5.0000 mg | ORAL_TABLET | Freq: Every day | ORAL | Status: DC
  Filled 2022-07-24 (×2): qty 1

## 2022-07-24 MED ORDER — ONDANSETRON HCL 4 MG PO TABS: 4.0000 mg | ORAL_TABLET | Freq: Four times a day (QID) | ORAL | Status: DC | PRN

## 2022-07-24 MED ORDER — VANCOMYCIN HCL IN DEXTROSE 1-5 GM/200ML-% IV SOLN
1000.0000 mg | Freq: Once | INTRAVENOUS | Status: AC
  Administered 2022-07-24: 1000 mg via INTRAVENOUS
  Filled 2022-07-24: qty 200

## 2022-07-24 MED ORDER — LINEZOLID 600 MG PO TABS
600.0000 mg | ORAL_TABLET | Freq: Two times a day (BID) | ORAL | Status: DC
  Administered 2022-07-24 – 2022-07-25 (×2): 600 mg via ORAL
  Filled 2022-07-24 (×2): qty 1

## 2022-07-24 MED ORDER — ACETAMINOPHEN 650 MG RE SUPP: 650.0000 mg | Freq: Four times a day (QID) | RECTAL | Status: DC | PRN

## 2022-07-24 MED ORDER — VANCOMYCIN HCL IN DEXTROSE 1-5 GM/200ML-% IV SOLN
1000.0000 mg | Freq: Two times a day (BID) | INTRAVENOUS | Status: DC
  Administered 2022-07-24: 1000 mg via INTRAVENOUS
  Filled 2022-07-24 (×2): qty 200

## 2022-07-24 MED ORDER — MORPHINE SULFATE (PF) 2 MG/ML IV SOLN: 2.0000 mg | INTRAVENOUS | Status: DC | PRN

## 2022-07-24 MED ORDER — LIDOCAINE HCL 1 % IJ SOLN
INTRAMUSCULAR | Status: AC
  Administered 2022-07-24: 6 mL
  Filled 2022-07-24: qty 20

## 2022-07-24 MED ORDER — HYDROCODONE-ACETAMINOPHEN 5-325 MG PO TABS: 1.0000 | ORAL_TABLET | ORAL | Status: DC | PRN

## 2022-07-24 MED ORDER — ACETAMINOPHEN 325 MG PO TABS
650.0000 mg | ORAL_TABLET | Freq: Four times a day (QID) | ORAL | Status: DC | PRN
  Administered 2022-07-25: 650 mg via ORAL
  Filled 2022-07-24: qty 2

## 2022-07-24 MED ORDER — SODIUM CHLORIDE 0.9 % IV SOLN
2.0000 g | Freq: Three times a day (TID) | INTRAVENOUS | Status: DC
  Administered 2022-07-24: 2 g via INTRAVENOUS
  Filled 2022-07-24 (×2): qty 12.5

## 2022-07-24 MED ORDER — ONDANSETRON HCL 4 MG/2ML IJ SOLN: 4.0000 mg | Freq: Four times a day (QID) | INTRAMUSCULAR | Status: DC | PRN

## 2022-07-24 MED ORDER — MELATONIN 5 MG PO TABS: 5.0000 mg | ORAL_TABLET | Freq: Every evening | ORAL | Status: DC | PRN

## 2022-07-24 NOTE — Procedures (Signed)
Notified floor RN that Petroleum Gauze dressing should remain in place for 48hrs post procedure (07/24/22 @ 1417).

## 2022-07-24 NOTE — Progress Notes (Signed)
Patient to IR in stable condition via bed.

## 2022-07-24 NOTE — Consult Note (Signed)
Hematology/Oncology Consult note [facility] [phone] [phone]  Patient Care Team: Rusty Aus, MD as PCP - General (Internal Medicine) Telford Nab, RN as Oncology Nurse Navigator   Name of the patient: Kurt Ewing  [id number]  Oct 23, 1955    Reason for referral- ***   Referring physician- ***  Date of visit: @TODAY @   History of presenting illness- ***  ECOG PS- ***  Pain scale- ***   Review of systems- ROS  No Known Allergies  Patient Active Problem List   Diagnosis Date Noted   Cellulitis 07/24/2022   Hyponatremia 07/24/2022   Thrombocytopenia ([place]) 07/24/2022   Postoperative complication of skin involving drainage from surgical wound 07/23/2022   Cellulitis and drainage from chest wall at site of pleural catheter, placed 07/07/22 07/23/2022   Encounter for antineoplastic chemotherapy 06/22/2022   Goals of care, counseling/discussion 06/03/2022   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   Metastatic non-small cell lung cancer ([place]) 05/14/2022   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   HTN (hypertension) 09/26/2013     Past Medical History:  Diagnosis Date   Cancer ([place])    Heart burn    Hypertension    Lung mass 04/2022   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     Past Surgical History:  Procedure Laterality Date   COLONOSCOPY     IR IMAGING GUIDED PORT INSERTION  06/19/2022   IR RADIOLOGIST EVAL & MGMT  07/24/2022   KNEE ARTHROSCOPY Right 2005   TONSILLECTOMY  1970   VIDEO BRONCHOSCOPY WITH ENDOBRONCHIAL ULTRASOUND N/A 05/29/2022   Procedure: VIDEO BRONCHOSCOPY WITH ENDOBRONCHIAL ULTRASOUND;  Surgeon: Ottie Glazier, MD;  Location: [facility] ORS;  Service: Thoracic;  Laterality: N/A;    Social History   Socioeconomic History   Marital status: Married    Spouse name: Kurt Ewing   Number of children: 3   Years of education: Not on file   Highest education  level: Not on file  Occupational History   Not on file  Tobacco Use   Smoking status: Never   Smokeless tobacco: Never  Vaping Use   Vaping Use: Never used  Substance and Sexual Activity   Alcohol use: Yes    Comment: weekly   Drug use: Not Currently   Sexual activity: Not on file  Other Topics Concern   Not on file  Social History Narrative   Lives at home with wife Kurt Ewing; 3 kids & 13 grandchildren    Social Determinants of Health   Financial Resource Strain: Not on file  Food Insecurity: No Food Insecurity (07/24/2022)   Hunger Vital Sign    Worried About Running Out of Food in the Last Year: Never true    Ran Out of Food in the Last Year: Never true  Transportation Needs: No Transportation Needs (07/24/2022)   PRAPARE - Hydrologist (Medical): No    Lack of Transportation (Non-Medical): No  Physical Activity: Not on file  Stress: Not on file  Social Connections: Not on file  Intimate Partner Violence: Not At Risk (07/24/2022)   Humiliation, Afraid, Rape, and Kick questionnaire    Fear of Current or Ex-Partner: No    Emotionally Abused: No    Physically Abused: No    Sexually Abused: No     Family History  Problem Relation Age of Onset   Hypertension Father    Heart disease Father    Hypertension Brother    COPD  Brother      Current Facility-Administered Medications:    acetaminophen (TYLENOL) tablet 650 mg, 650 mg, Oral, Q6H PRN **OR** acetaminophen (TYLENOL) suppository 650 mg, 650 mg, Rectal, Q6H PRN, Athena Masse, MD   HYDROcodone-acetaminophen (NORCO/VICODIN) 5-325 MG per tablet 1-2 tablet, 1-2 tablet, Oral, Q4H PRN, Athena Masse, MD   linezolid (ZYVOX) tablet 600 mg, 600 mg, Oral, Q12H, Ravishankar, Jayashree, MD   lisinopril (ZESTRIL) tablet 5 mg, 5 mg, Oral, Daily, Athena Masse, MD   melatonin tablet 5 mg, 5 mg, Oral, QHS PRN, Athena Masse, MD   morphine (PF) 2 MG/ML injection 2 mg, 2 mg, Intravenous, Q2H PRN,  Athena Masse, MD   ondansetron (ZOFRAN) tablet 4 mg, 4 mg, Oral, Q6H PRN **OR** ondansetron (ZOFRAN) injection 4 mg, 4 mg, Intravenous, Q6H PRN, Athena Masse, MD   Physical exam:  Vitals:   07/24/22 0224 07/24/22 0654 07/24/22 0810 07/24/22 1553  BP: (!) 113/59 (!) 140/86 134/83 128/80  Pulse: (!) 49 (!) 56 (!) 59 79  Resp: 20 20 16 16   Temp: 98.7 F (37.1 C) 98.6 F (37 C) 98.5 F (36.9 C) 98.3 F (36.8 C)  TempSrc: Oral   Oral  SpO2:  100% 97% 97%  Weight: 192 lb 10.9 oz (87.4 kg)     Height: 5\' 10"  (1.778 m)      Physical Exam        Latest Ref Rng & Units 07/23/2022    6:19 PM  CMP  Glucose 70 - 99 mg/dL 98   BUN 8 - 23 mg/dL 25   Creatinine 0.61 - 1.24 mg/dL 0.94   Sodium 135 - 145 mmol/L 133   Potassium 3.5 - 5.1 mmol/L 4.2   Chloride 98 - 111 mmol/L 100   CO2 22 - 32 mmol/L 25   Calcium 8.9 - 10.3 mg/dL 8.3       Latest Ref Rng & Units 07/23/2022    6:19 PM  CBC  WBC 4.0 - 10.5 K/uL 4.4   Hemoglobin 13.0 - 17.0 g/dL 12.1   Hematocrit 39.0 - 52.0 % 36.0   Platelets 150 - 400 K/uL 105     @IMAGES @  IR Radiologist Eval & Mgmt  Result Date: 07/24/2022 Lesle Chris, RT     07/24/2022  2:28 PM Notified floor RN that Petroleum Gauze dressing should remain in place for 48hrs post procedure (07/24/22 @ [id number]).  CT Chest W Contrast  Result Date: 07/23/2022 CLINICAL DATA:  Right-sided infection around site of chest tube. Drain was placed 2 weeks ago for pleural effusion. EXAM: CT CHEST WITH CONTRAST TECHNIQUE: Multidetector CT imaging of the chest was performed during intravenous contrast administration. RADIATION DOSE REDUCTION: This exam was performed according to the departmental dose-optimization program which includes automated exposure control, adjustment of the mA and/or kV according to patient size and/or use of iterative reconstruction technique. CONTRAST:  21mL OMNIPAQUE IOHEXOL 300 MG/ML  SOLN COMPARISON:  Chest radiograph 05/29/2022. CT chest  05/26/2022 and 04/30/2022 FINDINGS: Cardiovascular: Cardiac enlargement with prominence of the right heart and left atrium. No pericardial effusions. Coronary artery and aortic calcification. Normal caliber thoracic aorta. No dissection. Mediastinum/Nodes: Subcentimeter low-attenuation lesions in the right and left thyroid gland. No change since prior studies. No imaging follow-up is indicated. Esophagus is decompressed. No significant lymphadenopathy. Right-sided Infuse-A-Port with tip in the low SVC. Lungs/Pleura: Small right pleural effusion with pleural thickening, possibly loculated. A right chest drain is in place centrally within the collection.  Significant decrease in size of the collection since the prior studies. Emphysematous changes in the lungs. Atelectasis and scarring in the right base. Mass or consolidation in the right lower lung measuring 4.3 x 6.2 cm. Right suprahilar mass lesion measuring 4.3 x 5 cm. Right upper lung pulmonary nodule measuring 8 mm diameter. Several right upper lung ground-glass nodules, largest measuring 6 mm. Mass lesions and nodules are similar to prior study although better seen today with smaller effusion. Multiple left upper lung nodules and superior segment left lower lung nodule. Largest measures 4 mm diameter. Some of these nodules appear calcified and may be postinflammatory. Upper Abdomen: No acute abnormalities are demonstrated. Musculoskeletal: Degenerative changes in the spine. No destructive bone lesions. IMPRESSION: 1. Decreasing small right pleural effusion with pleural thickening. Right chest tube is in place. No soft tissue abscess identified. 2. Suprahilar right lung mass is unchanged since prior study. Additional mass or consolidation in the right lung base is also unchanged. Atelectasis in the right base. 3. Additional right pulmonary nodules are likely metastatic. Subcentimeter left pulmonary nodules, some of which are calcified, likely postinflammatory. 4.  Cardiac enlargement.  Aortic atherosclerosis. Electronically Signed   By: Lucienne Capers M.D.   On: 07/23/2022 20:41    Assessment and plan- Patient is a 67 y.o. male ***   Thank you for this kind referral and the opportunity to participate in the care of this  Patient   Visit Diagnosis 1. Abscess     Dr. Randa Evens, MD, MPH [facility] at [facility] [id number] 07/24/2022

## 2022-07-24 NOTE — Consult Note (Addendum)
NAME: Kurt Ewing  DOB: 1955-10-21  MRN: [id number]  Date/Time: 07/24/2022 1:09 PM  REQUESTING PROVIDER: Dr.Wieting Subjective:  REASON FOR CONSULT: Pleurx site infection ? Kurt Ewing is a 67 y.o. with a history of metatstaic adenocarcinoma presents with pain, redness and pus at the rt side pleurx site. Pt was diagnosed with lung cancer Rt in feb 2024 with rt sided malignant Pleural effusion at [facility] and had PORT placement by IR on 06/19/22. started chemo with pemetrexed, carboplatin and pembrulizumab on 06/22/22. He then went to [facility] for a 2nd opinion and had  rt pleural biopsy on 07/07/22 and a Pleurx catheter was placed for the malignant effusion. He had 2nd dose  on 07/13/22 without pembrulizimab. On 07/17/22 he went to his PCP'S office in [facility] to remove the stitches and redness was noted without any discharge and was placed on keflex. As he started noting Pus he came to the ED on 07/23/22 No fever or chills Vitals in the ED  07/23/22  BP 111/58 !  Temp 98 F (36.7 C)  Pulse Rate 56 !  Resp 18  SpO2 99 %   Labs  Latest Reference Range & Units 07/23/22  WBC 4.0 - 10.5 K/uL 4.4  Hemoglobin 13.0 - 17.0 g/dL 12.1 (L)  HCT 39.0 - 52.0 % 36.0 (L)  Platelets 150 - 400 K/uL 105 (L)  Creatinine 0.61 - 1.24 mg/dL 0.94   Blood culture sent- swab taken from chest pus and sent for culture Started on vanco and cefepime  CT chest showed decreasing small right pleural effusion with pleural thickening.  Right chest tube in place.  No subfascial abscess identified.  Suprahilar right lung mass is unchanged Additional masslike consolidation in the right lung base is also unchanged. I am asked to see him for antibiotic recommendation Patient is feeling fine and asking to go home.  He needs to get the chest drain removed.  Discussed with interventional radiologist and hospitalist the drain will be removed soon.  The radiologist is also planning to get some pleural fluid from the Pleurx  catheter and send it for cultures.  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    Past Surgical History:  Procedure Laterality Date   COLONOSCOPY     IR IMAGING GUIDED PORT INSERTION  06/19/2022   KNEE ARTHROSCOPY Right 2005   TONSILLECTOMY  1970   VIDEO BRONCHOSCOPY WITH ENDOBRONCHIAL ULTRASOUND N/A 05/29/2022   Procedure: VIDEO BRONCHOSCOPY WITH ENDOBRONCHIAL ULTRASOUND;  Surgeon: Ottie Glazier, MD;  Location: [facility] ORS;  Service: Thoracic;  Laterality: N/A;    Social History   Socioeconomic History   Marital status: Married    Spouse name: [name]   Number of children: 3   Years of education: Not on file   Highest education level: Not on file  Occupational History   Not on file  Tobacco Use   Smoking status: Never   Smokeless tobacco: Never  Vaping Use   Vaping Use: Never used  Substance and Sexual Activity   Alcohol use: Yes    Comment: weekly   Drug use: Not Currently   Sexual activity: Not on file  Other Topics Concern   Not on file  Social History Narrative   Lives at home with wife [name]; 3 kids & 10 grandchildren    Social Determinants of Radio broadcast assistant  Strain: Not on file  Food Insecurity: No Food Insecurity (07/24/2022)   Hunger Vital Sign    Worried About Running Out of Food in the Last Year: Never true    Ran Out of Food in the Last Year: Never true  Transportation Needs: No Transportation Needs (07/24/2022)   PRAPARE - Hydrologist (Medical): No    Lack of Transportation (Non-Medical): No  Physical Activity: Not on file  Stress: Not on file  Social Connections: Not on file  Intimate Partner Violence: Not At Risk (07/24/2022)   Humiliation, Afraid, Rape, and Kick questionnaire    Fear of Current or Ex-Partner: No    Emotionally Abused: No    Physically Abused: No    Sexually Abused: No    Family History   Problem Relation Age of Onset   Hypertension Father    Heart disease Father    Hypertension Brother    COPD Brother    No Known Allergies I? Current Facility-Administered Medications  Medication Dose Route Frequency Provider Last Rate Last Admin   acetaminophen (TYLENOL) tablet 650 mg  650 mg Oral Q6H PRN Athena Masse, MD       Or   acetaminophen (TYLENOL) suppository 650 mg  650 mg Rectal Q6H PRN Athena Masse, MD       HYDROcodone-acetaminophen (NORCO/VICODIN) 5-325 MG per tablet 1-2 tablet  1-2 tablet Oral Q4H PRN Athena Masse, MD       lisinopril (ZESTRIL) tablet 5 mg  5 mg Oral Daily Athena Masse, MD       melatonin tablet 5 mg  5 mg Oral QHS PRN Athena Masse, MD       morphine (PF) 2 MG/ML injection 2 mg  2 mg Intravenous Q2H PRN Athena Masse, MD       ondansetron [facility]) tablet 4 mg  4 mg Oral Q6H PRN Athena Masse, MD       Or   ondansetron [facility]) injection 4 mg  4 mg Intravenous Q6H PRN Athena Masse, MD       vancomycin (VANCOCIN) IVPB 1000 mg/200 mL premix  1,000 mg Intravenous Q12H Judd Gaudier V, MD 200 mL/hr at 07/24/22 0945 1,000 mg at 07/24/22 0945     Abtx:  Anti-infectives (From admission, onward)    Start     Dose/Rate Route Frequency Ordered Stop   07/24/22 0900  vancomycin (VANCOCIN) IVPB 1000 mg/200 mL premix        1,000 mg 200 mL/hr over 60 Minutes Intravenous Every 12 hours 07/24/22 0022     07/24/22 0400  ceFEPIme (MAXIPIME) 2 g in sodium chloride 0.9 % 100 mL IVPB  Status:  Discontinued        2 g 200 mL/hr over 30 Minutes Intravenous Every 8 hours 07/24/22 0020 07/24/22 1129   07/24/22 0030  vancomycin (VANCOCIN) IVPB 1000 mg/200 mL premix        1,000 mg 200 mL/hr over 60 Minutes Intravenous  Once 07/24/22 0019 07/24/22 0331   07/23/22 1930  ceFEPIme (MAXIPIME) 2 g in sodium chloride 0.9 % 100 mL IVPB        2 g 200 mL/hr over 30 Minutes Intravenous  Once 07/23/22 1921 07/23/22 2051   07/23/22 1930  vancomycin (VANCOCIN)  IVPB 1000 mg/200 mL premix        1,000 mg 200 mL/hr over 60 Minutes Intravenous  Once 07/23/22 1921 07/23/22 2157  REVIEW OF SYSTEMS:  Const: negative fever, negative chills, negative weight loss Eyes: negative diplopia or visual changes, negative eye pain ENT: negative coryza, negative sore throat Resp: negative cough, hemoptysis, dyspnea Right-sided chest pain Cards: negative for chest pain, palpitations, lower extremity edema GU: negative for frequency, dysuria and hematuria GI: Negative for abdominal pain, diarrhea, bleeding, constipation Skin: negative for rash and pruritus Heme: negative for easy bruising and gum/nose bleeding MS: negative for myalgias, arthralgias, back pain and muscle weakness Neurolo:negative for headaches, dizziness, vertigo, memory problems  Psych: negative for feelings of anxiety, depression  Endocrine: negative for thyroid, diabetes Allergy/Immunology- negative for any medication or food allergies ?  Objective:  VITALS:  BP 134/83 (BP Location: Right Arm)   Pulse (!) 59   Temp 98.5 F (36.9 C)   Resp 16   Ht 5\' 10"  (1.778 m)   Wt 87.4 kg   SpO2 97%   BMI 27.65 kg/m  LDA Port in place.  Site looks clean Right Pleurx catheter.  Surrounding erythema and purulent discharge PHYSICAL EXAM:  General: Patient looks well.  He does not look septic. Head: Normocephalic, without obvious abnormality, atraumatic. No scab on the scalp Eyes: Conjunctivae clear, anicteric sclerae. Pupils are equal ENT Nares normal. No drainage or sinus tenderness. Lips, mucosa, and tongue normal. No Thrush Neck: Supple, symmetrical, no adenopathy, thyroid: non tender no carotid bruit and no JVD. Back on the right side of the chest there is a pleural drain and surrounding erythema with pus at the entry point  Lungs: Bilateral air entry. Decreased in the right base Few crackles in the right side Heart: Regular rate and rhythm, no murmur, rub or gallop. Abdomen:  Soft, non-tender,not distended. Bowel sounds normal. No masses Extremities: atraumatic, no cyanosis. No edema. No clubbing Skin: No rashes or lesions. Or bruising Old scratches on the  right leg Lymph: Cervical, supraclavicular normal. Neurologic: Grossly non-focal Pertinent Labs Lab Results CBC    Component Value Date/Time   WBC 4.4 07/23/2022 1819   RBC 3.91 (L) 07/23/2022 1819   HGB 12.1 (L) 07/23/2022 1819   HGB 13.7 06/29/2022 0841   HCT 36.0 (L) 07/23/2022 1819   PLT 105 (L) 07/23/2022 1819   PLT 179 06/29/2022 0841   MCV 92.1 07/23/2022 1819   MCH 30.9 07/23/2022 1819   MCHC 33.6 07/23/2022 1819   RDW 13.3 07/23/2022 1819   LYMPHSABS 0.8 07/13/2022 0831   MONOABS 0.9 07/13/2022 0831   EOSABS 0.1 07/13/2022 0831   BASOSABS 0.1 07/13/2022 0831       Latest Ref Rng & Units 07/23/2022    6:19 PM 07/13/2022    8:31 AM 06/29/2022    8:41 AM  CMP  Glucose 70 - 99 mg/dL 98  105  101   BUN 8 - 23 mg/dL 25  22  28    Creatinine 0.61 - 1.24 mg/dL 0.94  1.02  0.99   Sodium 135 - 145 mmol/L 133  137  136   Potassium 3.5 - 5.1 mmol/L 4.2  4.1  4.6   Chloride 98 - 111 mmol/L 100  101  100   CO2 22 - 32 mmol/L 25  25  27    Calcium 8.9 - 10.3 mg/dL 8.3  8.8  8.6   Total Protein 6.5 - 8.1 g/dL  6.9  6.9   Total Bilirubin 0.3 - 1.2 mg/dL  0.7  1.2   Alkaline Phos 38 - 126 U/L  58  70   AST 15 - 41  U/L  20  19   ALT 0 - 44 U/L  22  20       Microbiology: Recent Results (from the past 240 hour(s))  Blood culture (routine x 2)     Status: None (Preliminary result)   Collection Time: 07/23/22  7:49 PM   Specimen: BLOOD  Result Value Ref Range Status   Specimen Description BLOOD LEFT ANTECUBITAL  Final   Special Requests   Final    BOTTLES DRAWN AEROBIC AND ANAEROBIC Blood Culture adequate volume   Culture   Final    NO GROWTH < 12 HOURS Performed at [facility], [address] [place], [place] [address]    Report Status PENDING  Incomplete  Blood culture  (routine x 2)     Status: None (Preliminary result)   Collection Time: 07/23/22  7:49 PM   Specimen: BLOOD  Result Value Ref Range Status   Specimen Description BLOOD RIGHT ANTECUBITAL  Final   Special Requests   Final    BOTTLES DRAWN AEROBIC AND ANAEROBIC Blood Culture results may not be optimal due to an inadequate volume of blood received in culture bottles   Culture   Final    NO GROWTH < 12 HOURS Performed at [facility], [address] [place], [place] [address]    Report Status PENDING  Incomplete  Aerobic/Anaerobic Culture w Gram Stain (surgical/deep wound)     Status: None (Preliminary result)   Collection Time: 07/23/22  7:49 PM   Specimen: Wound  Result Value Ref Range Status   Specimen Description   Final    WOUND Performed at [facility], [address], [place], [place] [address]    Special Requests   Final    [facility] Performed at [facility], [address] [place], [place] [address]    Gram Stain   Final    FEW WBC PRESENT, PREDOMINANTLY MONONUCLEAR FEW GRAM POSITIVE COCCI IN PAIRS Performed at [facility], [place] [address], [place], [place] [address]    Culture PENDING  Incomplete   Report Status PENDING  Incomplete    IMAGING RESULTS:  I have personally reviewed the films CT chest shows decreased small right pleural effusion and presence of suprahilar right lung mass as well as an additional masslike consolidation in the right lung base.  Impression/Recommendation Metastatic adenocarcinoma of the right lung with right malignant effusion for which she has a Pleurx catheter inserted on 07/07/2022.  Infection at the site of the Pleurx catheter with surrounding purulence colitis.  Very likely this is Staph aureus.  The Gram stain from the wound swab taken yesterday shows gram-positive cocci in pairs.  The differential would be Streptococcus MRSA is a concern The Pleurx drain will have to be removed Currently on vancomycin  and cefepime.  Can discontinue cefepime.    Patient is not septic.  He does not have any systemic symptoms.  No fever or white count.  His last chemotherapy was on 07/13/2022.  6.30 pm Patient has come back from pleural drain removal The pleural fluid was sent for cell count and culture The cell count is 458 with predominant lymphocytes.  So unlikely to be empyema The Pleurx catheter is out Covered with dressing.  Minimal bloody strikethrough at 1 edge. As per IR to leave the dressing in place for 2 days Patient wants to know whether he could go home I will change the IV antibiotic to p.o. linezolid so as to facilitate discharge  if he is ready tomorrow.  May need chest x-ray tomorrow  Thrombocytopenia- possibly due to chemo will check tomorrow- while on linezolid will observe closely   Patient does not have Medicare medication coverage and hence given him a good Rx coupon for linezolid 600 mg p.o. twice daily for 2 weeks to be proximal from [facility]. Please asked the pharmacist to make sure that the local [facility] carries linezolid and he will be able to start the treatment when whenever he is discharged.  If that is not possible and if the patient is going home then we will have to give him a few doses in hand so that there will not be any discontinuity of antibiotic Explained the side effects to patient and the foot and drugs to avoid and gave him printed material on linezolid.  I will follow him as outpatient.  Will also follow the culture results. I am not on-call this weekend and our CID is covering for urgent issues.  They are available by phone.   Explained to patient and his wife in great detail and answered their questions..    Discussed with referring hospitalist in detail  Note:  This document was prepared using Dragon voice recognition software and may include unintentional dictation errors.

## 2022-07-24 NOTE — Assessment & Plan Note (Signed)
Pus coming out of the catheter site.  Interventional radiology to send pleural culture and cell count and try to remove the catheter today.  Continue IV antibiotics through the weekend.  Gram-positive cocci in pairs growing out of culture.  Appreciate ID and interventional radiology consultation.

## 2022-07-24 NOTE — Progress Notes (Signed)
Request received to exchange Pleurex catheter d/t infection. After review, Dr. Denna Haggard reviewed and recommends continued antibiotic therapy as Pleurex catheters are not exchangeable. Pleurex catheter may be removed if needed and replaced at a later time when infection has cleared.  Care team made aware of recommendations.     Narda Rutherford, AGNP-BC 07/24/2022, 10:56 AM

## 2022-07-24 NOTE — Progress Notes (Signed)
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  Patient had a Pleurx catheter placed on 2/23.  Patient had a right-sided thoracoscopy and pleural biopsy with pleural catheter placement on 3/12.  Patient noticed redness and pus coming out of the catheter site.  Patient was admitted to the hospital for cellulitis and catheter infection.  Initially started on vancomycin and cefepime.  3/29.  Case discussed with ID specialist and interventional radiology.  Will get pleural fluid culture and cell count and try to remove the catheter.  IV antibiotics through the weekend.   Assessment and Plan: * Postoperative complication of skin involving drainage from surgical wound Pus coming out of the catheter site.  Interventional radiology to send pleural culture and cell count and try to remove the catheter today.  Continue IV antibiotics through the weekend.  Gram-positive cocci in pairs growing out of culture.  Appreciate ID and interventional radiology consultation.  Cellulitis and drainage from chest wall at site of pleural catheter, placed 07/07/22 Patient initially on vancomycin and cefepime.  Case discussed with ID and she may change the antibiotics over to Zyvox.  Thrombocytopenia ([place]) Could be secondary to infection  Hyponatremia Continue to monitor.  HTN (hypertension) Continue lisinopril  Malignant pleural effusion With removal of catheter may end up needing a thoracentesis if fluid reaccumulate's rapidly.  Metastatic non-small cell lung cancer (HCC) Malignant pleural effusion         Subjective: Patient feels okay and interested in getting out of the hospital soon as possible.  Patient had pus coming out of the Pleurx catheter site.  Interventional radiology to send off  pleural fluid for culture and cell count and try to remove the catheter today.  No fever.  No white count.  Feels okay.  Physical Exam: Vitals:   07/24/22 0112 07/24/22 0224 07/24/22 0654 07/24/22 0810  BP: (!) 113/59 (!) 113/59 (!) 140/86 134/83  Pulse: (!) 49 (!) 49 (!) 56 (!) 59  Resp: 20 20 20 16   Temp: 98.7 F (37.1 C) 98.7 F (37.1 C) 98.6 F (37 C) 98.5 F (36.9 C)  TempSrc:  Oral    SpO2: 100%  100% 97%  Weight:  87.4 kg    Height:  5\' 10"  (1.778 m)     Physical Exam HENT:     Head: Normocephalic.     Mouth/Throat:     Pharynx: No oropharyngeal exudate.  Eyes:     General: Lids are normal.     Conjunctiva/sclera: Conjunctivae normal.  Cardiovascular:     Rate and Rhythm: Normal rate and regular rhythm.     Heart sounds: Normal heart sounds, S1 normal and S2 normal.  Pulmonary:     Breath sounds: Examination of the right-lower field reveals decreased breath sounds. Decreased breath sounds present. No wheezing, rhonchi or rales.  Abdominal:     Palpations: Abdomen is soft.     Tenderness: There is no abdominal tenderness.  Musculoskeletal:     Right lower leg: No swelling.     Left lower leg: No swelling.  Skin:    General: Skin is warm.     Findings: No rash.  Neurological:     Mental Status: He is alert and oriented to person, place, and time.     Data Reviewed: White  blood cell count 4.4, platelet count 105, hemoglobin 12.1, sodium 133, creatinine 0.94  Family Communication: Spoke with wife and daughter at the bedside  Disposition: Status is: Inpatient Remains inpatient appropriate because: Continue IV antibiotics through the weekend and follow-up cultures.  Planned Discharge Destination: Home    Time spent: 28 minutes  Author: Loletha Grayer, MD 07/24/2022 1:51 PM  For on call review [email or web address].

## 2022-07-24 NOTE — Assessment & Plan Note (Addendum)
Patient initially on vancomycin and cefepime.  Switched over to oral Zyvox and will be sent home on 2 weeks of oral Zyvox.

## 2022-07-24 NOTE — Progress Notes (Signed)
Pharmacy Antibiotic Note  Kurt Ewing is a 67 y.o. male admitted on 07/23/2022 with cellulitis.  Pharmacy has been consulted for Vancomycin, Cefepime  dosing.  Plan: Cefepime 2 gm IV X 1 given in ED on 3/28 @ 2001. Cefepime 2 gm IV Q8H ordered to start on 3/29 @ 0400.  Vancomycin 1 gm IV X 1 given in ED on 3/28 @ 2055.  Additional Vanc 1 gm IV X 1 ordered to make total loading dose of 2 gm.  Vancomycin 1 gm IV Q12H ordered to start on 3/29 @ 0900.  AUC = 472.9 Vanc trough = 13.6   Height: 5\' 10"  (177.8 cm) IBW/kg (Calculated) : 73  Temp (24hrs), Avg:98.1 F (36.7 C), Min:98 F (36.7 C), Max:98.2 F (36.8 C)  Recent Labs  Lab 07/23/22 1819  WBC 4.4  CREATININE 0.94    Estimated Creatinine Clearance: 77.7 mL/min (by C-G formula based on SCr of 0.94 mg/dL).    No Known Allergies  Antimicrobials this admission:   >>    >>   Dose adjustments this admission:   Microbiology results:  BCx:   UCx:    Sputum:    MRSA PCR:   Thank you for allowing pharmacy to be a part of this patient's care.  Nancyann Cotterman D 07/24/2022 12:22 AM

## 2022-07-24 NOTE — Hospital Course (Signed)
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  Patient had a Pleurx catheter placed on 2/23.  Patient had a right-sided thoracoscopy and pleural biopsy with pleural catheter placement on 3/12.  Patient noticed redness and pus coming out of the catheter site.  Patient was admitted to the hospital for cellulitis and catheter infection.  Initially started on vancomycin and cefepime.  3/29.  Case discussed with ID specialist and interventional radiology.  Will get pleural fluid culture and cell count and try to remove the catheter.  IV antibiotics through the weekend.

## 2022-07-24 NOTE — Assessment & Plan Note (Signed)
Could be secondary to infection.  Recommend checking again as outpatient.

## 2022-07-24 NOTE — Assessment & Plan Note (Signed)
With removal of catheter may end up needing a thoracentesis if fluid reaccumulate's rapidly.

## 2022-07-24 NOTE — Assessment & Plan Note (Signed)
Last sodium normal range 

## 2022-07-24 NOTE — Progress Notes (Signed)
Patient transferred to IR via bed in stable condition.

## 2022-07-25 DIAGNOSIS — L03313 Cellulitis of chest wall: Secondary | ICD-10-CM | POA: Diagnosis not present

## 2022-07-25 DIAGNOSIS — L7682 Other postprocedural complications of skin and subcutaneous tissue: Secondary | ICD-10-CM | POA: Diagnosis not present

## 2022-07-25 DIAGNOSIS — D696 Thrombocytopenia, unspecified: Secondary | ICD-10-CM | POA: Diagnosis not present

## 2022-07-25 DIAGNOSIS — E871 Hypo-osmolality and hyponatremia: Secondary | ICD-10-CM | POA: Diagnosis not present

## 2022-07-25 LAB — CBC WITH DIFFERENTIAL/PLATELET
Abs Immature Granulocytes: 0.03 10*3/uL (ref 0.00–0.07)
Basophils Absolute: 0 10*3/uL (ref 0.0–0.1)
Basophils Relative: 1 %
Eosinophils Absolute: 0 10*3/uL (ref 0.0–0.5)
Eosinophils Relative: 1 %
HCT: 35.6 % — ABNORMAL LOW (ref 39.0–52.0)
Hemoglobin: 12 g/dL — ABNORMAL LOW (ref 13.0–17.0)
Immature Granulocytes: 1 %
Lymphocytes Relative: 19 %
Lymphs Abs: 0.8 10*3/uL (ref 0.7–4.0)
MCH: 30.6 pg (ref 26.0–34.0)
MCHC: 33.7 g/dL (ref 30.0–36.0)
MCV: 90.8 fL (ref 80.0–100.0)
Monocytes Absolute: 0.9 10*3/uL (ref 0.1–1.0)
Monocytes Relative: 22 %
Neutro Abs: 2.2 10*3/uL (ref 1.7–7.7)
Neutrophils Relative %: 56 %
Platelets: 98 10*3/uL — ABNORMAL LOW (ref 150–400)
RBC: 3.92 MIL/uL — ABNORMAL LOW (ref 4.22–5.81)
RDW: 13.4 % (ref 11.5–15.5)
Smear Review: NORMAL
WBC: 4 10*3/uL (ref 4.0–10.5)
nRBC: 0 % (ref 0.0–0.2)

## 2022-07-25 LAB — COMPREHENSIVE METABOLIC PANEL
ALT: 19 U/L (ref 0–44)
AST: 18 U/L (ref 15–41)
Albumin: 2.8 g/dL — ABNORMAL LOW (ref 3.5–5.0)
Alkaline Phosphatase: 61 U/L (ref 38–126)
Anion gap: 11 (ref 5–15)
BUN: 16 mg/dL (ref 8–23)
CO2: 25 mmol/L (ref 22–32)
Calcium: 8.8 mg/dL — ABNORMAL LOW (ref 8.9–10.3)
Chloride: 100 mmol/L (ref 98–111)
Creatinine, Ser: 0.85 mg/dL (ref 0.61–1.24)
GFR, Estimated: >60 mL/min (ref >60)
Glucose, Bld: 100 mg/dL — ABNORMAL HIGH (ref 70–99)
Potassium: 4.5 mmol/L (ref 3.5–5.1)
Sodium: 136 mmol/L (ref 135–145)
Total Bilirubin: 0.9 mg/dL (ref 0.3–1.2)
Total Protein: 5.9 g/dL — ABNORMAL LOW (ref 6.5–8.1)

## 2022-07-25 MED ORDER — LINEZOLID 600 MG PO TABS: 600.0000 mg | ORAL_TABLET | Freq: Two times a day (BID) | ORAL | 0 refills | Status: AC

## 2022-07-25 NOTE — Discharge Summary (Signed)
Physician Discharge Summary   Patient: Kurt Ewing MRN: [id number] DOB: April 08, 1956  Admit date:     07/23/2022  Discharge date: 07/25/22  Discharge Physician: Loletha Grayer   PCP: Rusty Aus, MD   Recommendations at discharge:   Follow-up PCP 5 days  Discharge Diagnoses: Principal Problem:   Postoperative complication of skin involving drainage from surgical wound Active Problems:   Cellulitis and drainage from chest wall at site of pleural catheter, placed 07/07/22   Metastatic non-small cell lung cancer ([place])   Malignant pleural effusion   HTN (hypertension)   Cellulitis   Hyponatremia   Thrombocytopenia [facility])    Hospital Course: 67 year old man with 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  Patient had a Pleurx catheter placed on 2/23.  Patient had a right-sided thoracoscopy and pleural biopsy with pleural catheter placement on 3/12.  Patient noticed redness and pus coming out of the catheter site.  Patient was admitted to the hospital for cellulitis and catheter infection.  Initially started on vancomycin and cefepime.  3/29.  Case discussed with ID specialist and interventional radiology.  Interventional radiologist pulled the Pleurx catheter.  No growth out of the pleural fluid.  Antibiotics switched to p.o. Zyvox. 3/30.  Leave dressing on from pleural catheter removal for another day.  Cover with sterile dressing and paper tape while draining.  2 weeks of Zyvox prescribed upon discharge.  Staph aureus growing out of superficial culture with sensitivity still pending.  Assessment and Plan: * Postoperative complication of skin involving drainage from surgical wound Pus coming out of the catheter site.  Interventional radiology removed the Pleurx catheter on 2/29.  No growth from pleural fluid.  Patient switched to oral Zyvox on the evening of 2/29.  Staph aureus growing from superficial culture.  Sent home on 2 weeks of Zyvox.  Cellulitis and  drainage from chest wall at site of pleural catheter, placed 07/07/22 Patient initially on vancomycin and cefepime.  Switched over to oral Zyvox and will be sent home on 2 weeks of oral Zyvox.  Thrombocytopenia ([place]) Could be secondary to infection.  Recommend checking again as outpatient.  Hyponatremia Last sodium normal range  HTN (hypertension) Continue lisinopril  Malignant pleural effusion With removal of Pleurx catheter catheter may end up needing a thoracentesis if fluid reaccumulate's rapidly.  Follow-up with PCP and can call your lung specialist if short of breath.  Metastatic non-small cell lung cancer ([place]) Malignant pleural effusion          Consultants: Interventional radiology, infectious disease Procedures performed: Pleurx catheter removal Disposition: Home Diet recommendation:  Cardiac diet DISCHARGE MEDICATION: Allergies as of 07/25/2022   No Known Allergies      Medication List     STOP taking these medications    cephALEXin 500 MG capsule Commonly known as: KEFLEX   dexamethasone 2 MG tablet Commonly known as: DECADRON   dexamethasone 4 MG tablet Commonly known as: DECADRON   meloxicam 15 MG tablet Commonly known as: MOBIC   ondansetron 8 MG tablet Commonly known as: Zofran   prochlorperazine 10 MG tablet Commonly known as: COMPAZINE       TAKE these medications    acetaminophen 500 MG tablet Commonly known as: TYLENOL Take 500 mg by mouth every 6 (six) hours as needed for mild pain.   famotidine 40 MG tablet Commonly known as: PEPCID Take 1 tablet by mouth at bedtime.   folic acid 1 MG tablet Commonly known as: FOLVITE Take  1 tablet (1 mg total) by mouth daily. Start 7 days before pemetrexed chemotherapy. Continue until 21 days after pemetrexed completed.   lidocaine-prilocaine cream Commonly known as: EMLA Apply 1 Application topically as needed.   linezolid 600 MG tablet Commonly known as: ZYVOX Take 1 tablet (600  mg total) by mouth every 12 (twelve) hours for 14 days.   lisinopril 5 MG tablet Commonly known as: ZESTRIL Take 5 mg by mouth daily.   melatonin 3 MG Tabs tablet Take by mouth.        Follow-up Information     Rusty Aus, MD Follow up in 5 day(s).   Specialty: Internal Medicine Why: Office closed at this time  Patient to make own follow up appt Contact information: [place] [phone]                Discharge Exam: Filed Weights   07/24/22 0224  Weight: 87.4 kg   Physical Exam HENT:     Head: Normocephalic.     Mouth/Throat:     Pharynx: No oropharyngeal exudate.  Eyes:     General: Lids are normal.     Conjunctiva/sclera: Conjunctivae normal.  Cardiovascular:     Rate and Rhythm: Normal rate and regular rhythm.     Heart sounds: Normal heart sounds, S1 normal and S2 normal.  Pulmonary:     Breath sounds: Examination of the right-lower field reveals decreased breath sounds. Decreased breath sounds present. No wheezing, rhonchi or rales.  Abdominal:     Palpations: Abdomen is soft.     Tenderness: There is no abdominal tenderness.  Musculoskeletal:     Right lower leg: No swelling.     Left lower leg: No swelling.  Skin:    General: Skin is warm.     Comments: Bandage seen over site of where Pleurx catheter was.  Neurological:     Mental Status: He is alert and oriented to person, place, and time.      Condition at discharge: stable  The results of significant diagnostics from this hospitalization (including imaging, microbiology, ancillary and laboratory) are listed below for reference.   Imaging Studies: IR Radiologist Eval & Mgmt  Result Date: 07/24/2022 Lesle Chris, RT     07/24/2022  2:28 PM Notified floor RN that Petroleum Gauze dressing should remain in place for 48hrs post procedure (07/24/22 @ 1417).  CT Chest W Contrast  Result Date:  07/23/2022 CLINICAL DATA:  Right-sided infection around site of chest tube. Drain was placed 2 weeks ago for pleural effusion. EXAM: CT CHEST WITH CONTRAST TECHNIQUE: Multidetector CT imaging of the chest was performed during intravenous contrast administration. RADIATION DOSE REDUCTION: This exam was performed according to the departmental dose-optimization program which includes automated exposure control, adjustment of the mA and/or kV according to patient size and/or use of iterative reconstruction technique. CONTRAST:  13mL OMNIPAQUE IOHEXOL 300 MG/ML  SOLN COMPARISON:  Chest radiograph 05/29/2022. CT chest 05/26/2022 and 04/30/2022 FINDINGS: Cardiovascular: Cardiac enlargement with prominence of the right heart and left atrium. No pericardial effusions. Coronary artery and aortic calcification. Normal caliber thoracic aorta. No dissection. Mediastinum/Nodes: Subcentimeter low-attenuation lesions in the right and left thyroid gland. No change since prior studies. No imaging follow-up is indicated. Esophagus is decompressed. No significant lymphadenopathy. Right-sided Infuse-A-Port with tip in the low SVC. Lungs/Pleura: Small right pleural effusion with pleural thickening, possibly loculated. A right chest drain is in place centrally within the collection.  Significant decrease in size of the collection since the prior studies. Emphysematous changes in the lungs. Atelectasis and scarring in the right base. Mass or consolidation in the right lower lung measuring 4.3 x 6.2 cm. Right suprahilar mass lesion measuring 4.3 x 5 cm. Right upper lung pulmonary nodule measuring 8 mm diameter. Several right upper lung ground-glass nodules, largest measuring 6 mm. Mass lesions and nodules are similar to prior study although better seen today with smaller effusion. Multiple left upper lung nodules and superior segment left lower lung nodule. Largest measures 4 mm diameter. Some of these nodules appear calcified and may be  postinflammatory. Upper Abdomen: No acute abnormalities are demonstrated. Musculoskeletal: Degenerative changes in the spine. No destructive bone lesions. IMPRESSION: 1. Decreasing small right pleural effusion with pleural thickening. Right chest tube is in place. No soft tissue abscess identified. 2. Suprahilar right lung mass is unchanged since prior study. Additional mass or consolidation in the right lung base is also unchanged. Atelectasis in the right base. 3. Additional right pulmonary nodules are likely metastatic. Subcentimeter left pulmonary nodules, some of which are calcified, likely postinflammatory. 4. Cardiac enlargement.  Aortic atherosclerosis. Electronically Signed   By: Lucienne Capers M.D.   On: 07/23/2022 20:41    Microbiology: Results for orders placed or performed during the hospital encounter of 07/23/22  Blood culture (routine x 2)     Status: None (Preliminary result)   Collection Time: 07/23/22  7:49 PM   Specimen: BLOOD  Result Value Ref Range Status   Specimen Description BLOOD LEFT ANTECUBITAL  Final   Special Requests   Final    BOTTLES DRAWN AEROBIC AND ANAEROBIC Blood Culture adequate volume   Culture   Final    NO GROWTH 2 DAYS Performed at [facility], [address], [place], [place] [address]    Report Status PENDING  Incomplete  Blood culture (routine x 2)     Status: None (Preliminary result)   Collection Time: 07/23/22  7:49 PM   Specimen: BLOOD  Result Value Ref Range Status   Specimen Description BLOOD RIGHT ANTECUBITAL  Final   Special Requests   Final    BOTTLES DRAWN AEROBIC AND ANAEROBIC Blood Culture results may not be optimal due to an inadequate volume of blood received in culture bottles   Culture   Final    NO GROWTH 2 DAYS Performed at [facility], [address], [place], [place] [address]    Report Status PENDING  Incomplete  Aerobic/Anaerobic Culture w Gram Stain (surgical/deep wound)     Status: None  (Preliminary result)   Collection Time: 07/23/22  7:49 PM   Specimen: Wound  Result Value Ref Range Status   Specimen Description   Final    WOUND Performed at [facility], [address], [place], [place] [address]    Special Requests   Final    [facility] Performed at [facility], [address] [place], [place] [address]    Gram Stain   Final    FEW WBC PRESENT, PREDOMINANTLY MONONUCLEAR FEW GRAM POSITIVE COCCI IN PAIRS    Culture   Final    MODERATE STAPHYLOCOCCUS AUREUS SUSCEPTIBILITIES TO FOLLOW Performed at [facility], [address], [place], [place] [address]    Report Status PENDING  Incomplete  Body fluid culture w Gram Stain     Status: None (Preliminary result)   Collection Time: 07/24/22  1:50 PM   Specimen: Pleura; Body Fluid  Result  Value Ref Range Status   Specimen Description   Final    PLEURAL Performed at [facility], [address] [place], [place] [address]    Special Requests   Final    PLEURAL Performed at [facility], [address] [place], [place] [address]    Gram Stain   Final    FEW WBC PRESENT, PREDOMINANTLY MONONUCLEAR NO ORGANISMS SEEN    Culture   Final    NO GROWTH < 24 HOURS Performed at [place] [address], [place], [place] [address]    Report Status PENDING  Incomplete    Labs: CBC: Recent Labs  Lab 07/23/22 1819 07/25/22 0430  WBC 4.4 4.0  NEUTROABS  --  2.2  HGB 12.1* 12.0*  HCT 36.0* 35.6*  MCV 92.1 90.8  PLT 105* 98*   Basic Metabolic Panel: Recent Labs  Lab 07/23/22 1819 07/25/22 0430  NA 133* 136  K 4.2 4.5  CL 100 100  CO2 25 25  GLUCOSE 98 100*  BUN 25* 16  CREATININE 0.94 0.85  CALCIUM 8.3* 8.8*   Liver Function Tests: Recent Labs  Lab 07/25/22 0430  AST 18  ALT 19  ALKPHOS 61  BILITOT 0.9  PROT 5.9*  ALBUMIN 2.8*    Discharge time spent: greater than 30 minutes.  Signed: Loletha Grayer, MD [facility] 07/25/2022

## 2022-07-25 NOTE — Discharge Instructions (Signed)
Pleural catheter infection  Keep area covered with sterile dressing while draining.  If you develop shortness of breath you may need a thoracentesis which can be ordered by your PCP or Lung doctor

## 2022-07-27 ENCOUNTER — Other Ambulatory Visit: Payer: Self-pay | Admitting: Infectious Diseases

## 2022-07-27 DIAGNOSIS — A4902 Methicillin resistant Staphylococcus aureus infection, unspecified site: Secondary | ICD-10-CM

## 2022-07-27 LAB — BODY FLUID CULTURE W GRAM STAIN: Culture: NO GROWTH

## 2022-07-27 LAB — LD, BODY FLUID (OTHER): LD, Body Fluid: 241 IU/L

## 2022-07-27 LAB — PROTEIN, BODY FLUID (OTHER): Total Protein, Body Fluid Other: 2.5 g/dL

## 2022-07-28 ENCOUNTER — Encounter: Payer: Self-pay | Admitting: *Deleted

## 2022-07-28 ENCOUNTER — Other Ambulatory Visit: Payer: Self-pay

## 2022-07-28 LAB — CULTURE, BLOOD (ROUTINE X 2)
Culture: NO GROWTH
Culture: NO GROWTH
Special Requests: ADEQUATE

## 2022-07-29 ENCOUNTER — Other Ambulatory Visit
Admission: RE | Admit: 2022-07-29 | Discharge: 2022-07-29 | Disposition: A | Discharge status: Home / Self Care | Payer: Medicare Other | Attending: Infectious Diseases | Admitting: Infectious Diseases

## 2022-07-29 DIAGNOSIS — A4902 Methicillin resistant Staphylococcus aureus infection, unspecified site: Secondary | ICD-10-CM | POA: Diagnosis present

## 2022-07-29 LAB — COMPREHENSIVE METABOLIC PANEL
ALT: 26 U/L (ref 0–44)
AST: 23 U/L (ref 15–41)
Albumin: 3.1 g/dL — ABNORMAL LOW (ref 3.5–5.0)
Alkaline Phosphatase: 59 U/L (ref 38–126)
Anion gap: 6 (ref 5–15)
BUN: 21 mg/dL (ref 8–23)
CO2: 27 mmol/L (ref 22–32)
Calcium: 8.8 mg/dL — ABNORMAL LOW (ref 8.9–10.3)
Chloride: 103 mmol/L (ref 98–111)
Creatinine, Ser: 1.13 mg/dL (ref 0.61–1.24)
GFR, Estimated: >60 mL/min (ref >60)
Glucose, Bld: 101 mg/dL — ABNORMAL HIGH (ref 70–99)
Potassium: 4.1 mmol/L (ref 3.5–5.1)
Sodium: 136 mmol/L (ref 135–145)
Total Bilirubin: 0.9 mg/dL (ref 0.3–1.2)
Total Protein: 6.4 g/dL — ABNORMAL LOW (ref 6.5–8.1)

## 2022-07-29 LAB — CBC WITH DIFFERENTIAL/PLATELET
Abs Immature Granulocytes: 0.01 10*3/uL (ref 0.00–0.07)
Basophils Absolute: 0 10*3/uL (ref 0.0–0.1)
Basophils Relative: 1 %
Eosinophils Absolute: 0.1 10*3/uL (ref 0.0–0.5)
Eosinophils Relative: 1 %
HCT: 33.9 % — ABNORMAL LOW (ref 39.0–52.0)
Hemoglobin: 11.9 g/dL — ABNORMAL LOW (ref 13.0–17.0)
Immature Granulocytes: 0 %
Lymphocytes Relative: 18 %
Lymphs Abs: 0.8 10*3/uL (ref 0.7–4.0)
MCH: 31.4 pg (ref 26.0–34.0)
MCHC: 35.1 g/dL (ref 30.0–36.0)
MCV: 89.4 fL (ref 80.0–100.0)
Monocytes Absolute: 0.7 10*3/uL (ref 0.1–1.0)
Monocytes Relative: 17 %
Neutro Abs: 2.7 10*3/uL (ref 1.7–7.7)
Neutrophils Relative %: 63 %
Platelets: 119 10*3/uL — ABNORMAL LOW (ref 150–400)
RBC: 3.79 MIL/uL — ABNORMAL LOW (ref 4.22–5.81)
RDW: 13.8 % (ref 11.5–15.5)
WBC: 4.2 10*3/uL (ref 4.0–10.5)
nRBC: 0 % (ref 0.0–0.2)

## 2022-07-29 LAB — AEROBIC/ANAEROBIC CULTURE W GRAM STAIN (SURGICAL/DEEP WOUND)

## 2022-07-30 ENCOUNTER — Other Ambulatory Visit
Admission: RE | Admit: 2022-07-30 | Discharge: 2022-07-30 | Disposition: A | Discharge status: Home / Self Care | Payer: Medicare Other | Source: Ambulatory Visit | Attending: Infectious Diseases | Admitting: Infectious Diseases

## 2022-07-30 ENCOUNTER — Ambulatory Visit: Payer: Medicare Other | Attending: Infectious Diseases | Admitting: Infectious Diseases

## 2022-07-30 ENCOUNTER — Encounter: Payer: Self-pay | Admitting: Infectious Diseases

## 2022-07-30 VITALS — BP 124/78 | HR 91 | Temp 96.4°F | Ht 70.25 in | Wt 189.0 lb

## 2022-07-30 DIAGNOSIS — R42 Dizziness and giddiness: Secondary | ICD-10-CM | POA: Insufficient documentation

## 2022-07-30 DIAGNOSIS — A4902 Methicillin resistant Staphylococcus aureus infection, unspecified site: Secondary | ICD-10-CM | POA: Diagnosis not present

## 2022-07-30 DIAGNOSIS — L0889 Other specified local infections of the skin and subcutaneous tissue: Secondary | ICD-10-CM | POA: Insufficient documentation

## 2022-07-30 DIAGNOSIS — Z79899 Other long term (current) drug therapy: Secondary | ICD-10-CM | POA: Insufficient documentation

## 2022-07-30 DIAGNOSIS — C3490 Malignant neoplasm of unspecified part of unspecified bronchus or lung: Secondary | ICD-10-CM | POA: Insufficient documentation

## 2022-07-30 DIAGNOSIS — J90 Pleural effusion, not elsewhere classified: Secondary | ICD-10-CM | POA: Diagnosis not present

## 2022-07-30 DIAGNOSIS — B9562 Methicillin resistant Staphylococcus aureus infection as the cause of diseases classified elsewhere: Secondary | ICD-10-CM | POA: Diagnosis not present

## 2022-07-30 DIAGNOSIS — D696 Thrombocytopenia, unspecified: Secondary | ICD-10-CM | POA: Insufficient documentation

## 2022-07-30 LAB — MRSA NEXT GEN BY PCR, NASAL: MRSA by PCR Next Gen: NOT DETECTED

## 2022-07-30 MED ORDER — CHLORHEXIDINE GLUCONATE 4 % EX LIQD: Freq: Every day | CUTANEOUS | 0 refills | Status: DC

## 2022-07-30 MED ORDER — MUPIROCIN CALCIUM 2 % EX CREA: 1.0000 | TOPICAL_CREAM | Freq: Two times a day (BID) | CUTANEOUS | 0 refills | Status: DC

## 2022-07-30 NOTE — Patient Instructions (Addendum)
You are here for follow up for MRSA skin and soft tissue infection at the site of the pleurax tube. Tube was removed- today is day 5 of linezolid and you can take 2 more days and complete the treatment as it is so much better Also pleural fluid no bacteria We will swab your nostrils to see whether you have MRSA there and will let you know of the result   Preventive educational messages on personal hygiene and appropriate wound care are recommended for all patients with SSTI. I   PERSONAL HYGIENE  i. Keep draining wounds covered with clean, dry bandages . ii. Maintain good personal hygiene with regular bathing and cleaning of hands with soap and water or an alcohol-based hand gel, particularly after touching infected skin or an item that has directly contacted a draining wound   iii. Avoid reusing or sharing personal items (eg, disposable razors, linens, and towels) that have contacted infected skin   ENVIRONMENTAL HYGIENE  Environmental hygiene measures should be considered in patients with recurrent SSTI in the household or community setting:  i. Focus cleaning efforts on high-touch surfaces (ie, surfaces that come into frequent contact with people's bare skin each day, such as counters, door knobs, bath tubs, and toilet seats) that may contact bare skin or uncovered infections  ii. Commercially available cleaners or detergents appropriate for the surface being cleaned should be used according to label instructions for routine cleaning of surfaces     Decolonization may be considered in selected cases if:  i. A patient develops a recurrent SSTI despite optimizing wound care and hygiene measures  ii. Ongoing transmission is occurring among household members or other close contacts despite optimizing wound care and hygiene measures   . Decolonization strategies should be offered in conjunction with ongoing reinforcement of hygiene measures and may include the following:   i ii. Nasal  decolonization with mupirocin twice daily for 5-10 days and topical body decolonization regimens with a skin antiseptic solution (eg, chlorhexidine) for 5-14 days  .    Marland Kitchen In cases where household or interpersonal transmission is suspected:   i. Personal and environmental hygiene measures in the patient and contacts are recommended   ii. Contacts should be evaluated for evidence of S. aureus infection: a. Symptomatic contacts should be evaluated and treated ; nasal and topical body decolonization strategies may be considered following treatment of active infection   b. Nasal and topical body decolonization of asymptomatic household contacts may be considered

## 2022-07-30 NOTE — Progress Notes (Signed)
NAME: Kurt Ewing  DOB: 19-Dec-1955  MRN: [id number]  Date/Time: 07/30/2022 10:49 AM   Subjective:   ?follow up after recent hospitalization Kurt Ewing is a 67 y.o. male with a history of metastatic adenocarcinoma of the lung with rt pleural effusion for which he had pleurx cath was admitted to [facility] with redness, pain and swelling at the peurx site- there was pus doischarging- culture was taken and pleurx was removed by IR. Pleural fluid sent from the ctahetr before it was removed had no bacteria- the pus culture from the skin showed Staph- he was sent home on linezolid for 2 weeks- ot is here for follow up- MRSA was confirmed in culture- pt is doing much better He does have some dizziness and nausea and thinks oit is due to linezolid He is day 5 of the antibiotic He has been eating pickled vegetables eventhough he was told not to take any fermented stuff, and also given material on that No fever or chills He has been hiking He has follow up app with oncology on monday   Past Medical History:  Diagnosis Date   Cancer    Heart burn    Hypertension    Lung mass 04/2022   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    Past Surgical History:  Procedure Laterality Date   COLONOSCOPY     IR IMAGING GUIDED PORT INSERTION  06/19/2022   IR REMOVAL OF PLURAL CATH W/CUFF  07/24/2022   KNEE ARTHROSCOPY Right 2005   TONSILLECTOMY  1970   VIDEO BRONCHOSCOPY WITH ENDOBRONCHIAL ULTRASOUND N/A 05/29/2022   Procedure: VIDEO BRONCHOSCOPY WITH ENDOBRONCHIAL ULTRASOUND;  Surgeon: Ottie Glazier, MD;  Location: [facility] ORS;  Service: Thoracic;  Laterality: N/A;    Social History   Socioeconomic History   Marital status: Married    Spouse name: [name]   Number of children: 3   Years of education: Not on file   Highest education level: Not on file  Occupational History   Not on file  Tobacco Use   Smoking status: Never   Smokeless tobacco: Never  Vaping Use   Vaping Use: Never used  Substance and  Sexual Activity   Alcohol use: Yes    Comment: weekly   Drug use: Not Currently   Sexual activity: Not on file  Other Topics Concern   Not on file  Social History Narrative   Lives at home with wife [name]; 3 kids & 10 grandchildren    Social Determinants of Health   Financial Resource Strain: Not on file  Food Insecurity: No Food Insecurity (07/24/2022)   Hunger Vital Sign    Worried About Running Out of Food in the Last Year: Never true    Ran Out of Food in the Last Year: Never true  Transportation Needs: No Transportation Needs (07/24/2022)   PRAPARE - Hydrologist (Medical): No    Lack of Transportation (Non-Medical): No  Physical Activity: Not on file  Stress: Not on file  Social Connections: Not on file  Intimate Partner Violence: Not At Risk (07/24/2022)   Humiliation, Afraid, Rape, and Kick questionnaire    Fear of Current or Ex-Partner: No    Emotionally Abused: No    Physically Abused: No    Sexually Abused: No    Family History  Problem Relation Age of Onset   Hypertension Father    Heart disease Father    Hypertension Brother    COPD Brother    No Known Allergies  I? Current Outpatient Medications  Medication Sig Dispense Refill   acetaminophen (TYLENOL) 500 MG tablet Take 500 mg by mouth every 6 (six) hours as needed for mild pain.     famotidine (PEPCID) 40 MG tablet Take 1 tablet by mouth at bedtime.     folic acid (FOLVITE) 1 MG tablet Take 1 tablet (1 mg total) by mouth daily. Start 7 days before pemetrexed chemotherapy. Continue until 21 days after pemetrexed completed. 100 tablet 3   lidocaine-prilocaine (EMLA) cream Apply 1 Application topically as needed. 30 g 2   linezolid (ZYVOX) 600 MG tablet Take 1 tablet (600 mg total) by mouth every 12 (twelve) hours for 14 days. 28 tablet 0   lisinopril (ZESTRIL) 5 MG tablet Take 5 mg by mouth daily.     melatonin 3 MG TABS tablet Take by mouth.     No current facility-administered  medications for this visit.     Abtx:  Anti-infectives (From admission, onward)    None       REVIEW OF SYSTEMS:  Const: negative fever, negative chills, negative weight loss Eyes: negative diplopia or visual changes, negative eye pain ENT: negative coryza, negative sore throat Resp: negative cough, hemoptysis, dyspnea Cards: negative for chest pain, palpitations, lower extremity edema GU: negative for frequency, dysuria and hematuria GI: Negative for abdominal pain, diarrhea, bleeding, constipation Skin: negative for rash and pruritus Heme: negative for easy bruising and gum/nose bleeding MS: negative for myalgias, arthralgias, back pain and muscle weakness Neurolo:negative for headaches, dizziness, vertigo, memory problems  Psych: negative for feelings of anxiety, depression  Endocrine: negative for thyroid, diabetes Allergy/Immunology- negative for any medication or food allergies ? Objective:  VITALS:  BP 124/78   Pulse 91   Temp (!) 96.4 F (35.8 C) (Temporal)   Ht 5' 10.25" (1.784 m)   Wt 189 lb (85.7 kg)   BMI 26.93 kg/m   PHYSICAL EXAM:  General: Alert, cooperative, no distress, appears stated age.  Head: Normocephalic, without obvious abnormality, atraumatic. Eyes: Conjunctivae clear, anicteric sclerae. Pupils are equal ENT Nares normal. No drainage or sinus tenderness. Lips, mucosa, and tongue normal. No Thrush Neck: Supple, symmetrical, no adenopathy, thyroid: non tender no carotid bruit and no JVD. Back: No CVA tenderness. Lungs: b/l air entry- decreased air entry rt base PORT site normal Heart: Regular rate and rhythm, no murmur, rub or gallop. Abdomen: Soft, non-tender,not distended. Bowel sounds normal. No masses Extremities: atraumatic, no cyanosis. No edema. No clubbing Skin: rt chest erythema and swelling reoslved- site of the previous plerx cath is healing well     Lymph: Cervical, supraclavicular normal. Neurologic: Grossly  non-focal Pertinent Labs Lab Results CBC    Component Value Date/Time   WBC 4.2 07/29/2022 1019   RBC 3.79 (L) 07/29/2022 1019   HGB 11.9 (L) 07/29/2022 1019   HGB 13.7 06/29/2022 0841   HCT 33.9 (L) 07/29/2022 1019   PLT 119 (L) 07/29/2022 1019   PLT 179 06/29/2022 0841   MCV 89.4 07/29/2022 1019   MCH 31.4 07/29/2022 1019   MCHC 35.1 07/29/2022 1019   RDW 13.8 07/29/2022 1019   LYMPHSABS 0.8 07/29/2022 1019   MONOABS 0.7 07/29/2022 1019   EOSABS 0.1 07/29/2022 1019   BASOSABS 0.0 07/29/2022 1019       Latest Ref Rng & Units 07/29/2022   10:19 AM 07/25/2022    4:30 AM 07/23/2022    6:19 PM  CMP  Glucose 70 - 99 mg/dL 101  100  98  BUN 8 - 23 mg/dL 21  16  25    Creatinine 0.61 - 1.24 mg/dL 1.13  0.85  0.94   Sodium 135 - 145 mmol/L 136  136  133   Potassium 3.5 - 5.1 mmol/L 4.1  4.5  4.2   Chloride 98 - 111 mmol/L 103  100  100   CO2 22 - 32 mmol/L 27  25  25    Calcium 8.9 - 10.3 mg/dL 8.8  8.8  8.3   Total Protein 6.5 - 8.1 g/dL 6.4  5.9    Total Bilirubin 0.3 - 1.2 mg/dL 0.9  0.9    Alkaline Phos 38 - 126 U/L 59  61    AST 15 - 41 U/L 23  18    ALT 0 - 44 U/L 26  19        Microbiology: Recent Results (from the past 240 hour(s))  Blood culture (routine x 2)     Status: None   Collection Time: 07/23/22  7:49 PM   Specimen: BLOOD  Result Value Ref Range Status   Specimen Description BLOOD LEFT ANTECUBITAL  Final   Special Requests   Final    BOTTLES DRAWN AEROBIC AND ANAEROBIC Blood Culture adequate volume   Culture   Final    NO GROWTH 5 DAYS Performed at [facility], [address] [place], [place] [address]    Report Status 07/28/2022 FINAL  Final  Blood culture (routine x 2)     Status: None   Collection Time: 07/23/22  7:49 PM   Specimen: BLOOD  Result Value Ref Range Status   Specimen Description BLOOD RIGHT ANTECUBITAL  Final   Special Requests   Final    BOTTLES DRAWN AEROBIC AND ANAEROBIC Blood Culture results may not be optimal due  to an inadequate volume of blood received in culture bottles   Culture   Final    NO GROWTH 5 DAYS Performed at [facility], [address], [place], [place] [address]    Report Status 07/28/2022 FINAL  Final  Aerobic/Anaerobic Culture w Gram Stain (surgical/deep wound)     Status: None   Collection Time: 07/23/22  7:49 PM   Specimen: Wound  Result Value Ref Range Status   Specimen Description   Final    WOUND Performed at [facility], [address] [place], [place] [address]    Special Requests   Final    [facility] Performed at [facility], [address] [place], [place] [address]    Gram Stain   Final    FEW WBC PRESENT, PREDOMINANTLY MONONUCLEAR FEW GRAM POSITIVE COCCI IN PAIRS    Culture   Final    MODERATE METHICILLIN RESISTANT STAPHYLOCOCCUS AUREUS NO ANAEROBES ISOLATED Performed at [facility], [place] [address] [place],  [address]    Report Status 07/29/2022 FINAL  Final   Organism ID, Bacteria METHICILLIN RESISTANT STAPHYLOCOCCUS AUREUS  Final      Susceptibility   Methicillin resistant staphylococcus aureus - MIC*    CIPROFLOXACIN >=8 RESISTANT Resistant     ERYTHROMYCIN >=8 RESISTANT Resistant     GENTAMICIN <=0.5 SENSITIVE Sensitive     OXACILLIN >=4 RESISTANT Resistant     TETRACYCLINE <=1 SENSITIVE Sensitive     VANCOMYCIN <=0.5 SENSITIVE Sensitive     TRIMETH/SULFA <=10 SENSITIVE Sensitive     CLINDAMYCIN <=0.25 SENSITIVE Sensitive     RIFAMPIN <=0.5 SENSITIVE Sensitive     Inducible Clindamycin NEGATIVE Sensitive     *  MODERATE METHICILLIN RESISTANT STAPHYLOCOCCUS AUREUS  Body fluid culture w Gram Stain     Status: None   Collection Time: 07/24/22  1:50 PM   Specimen: Pleura; Body Fluid  Result Value Ref Range Status   Specimen Description   Final    PLEURAL Performed at [facility], [address] [place], [place] [address]    Special Requests   Final    PLEURAL Performed at [facility], [address] [place], [place] [address]    Gram Stain   Final    FEW WBC PRESENT, PREDOMINANTLY MONONUCLEAR NO ORGANISMS SEEN    Culture   Final    NO GROWTH 3 DAYS Performed at [facility], [place] [address] [place], [place] [address]    Report Status 07/27/2022 FINAL  Final    ? Impression/Recommendation ? MRSA skin and soft tissue infection at the site of Pleurx cath- the catheter was removed and after 2 days of IV , he was discharged on PO linezolid on Saturday Some dizziness and nausea- reiterated that he should not eat fermented food As the skin and soft tissue is so much better, he can finish in  7 days instead of 14 days Pleural fluid culture and blood culture were negative  Thrombocytopenia- labs from 4/3  improved to 119 ( from 98)  Today will swab his nares for MRSA If positive will do decolonization protocol Information given about decolonization  Metastatic ca lung with pleural effusion- taken 2 doses of chemo- due for 3rd dose next week and okay to take it Will follow up with Dr.Yu  ? ___________________________________________________ Discussed with patient,and wife in detail Note:  This document was prepared using Dragon voice recognition software and may include unintentional dictation errors.

## 2022-07-31 MED FILL — Fosaprepitant Dimeglumine For IV Infusion 150 MG (Base Eq): INTRAVENOUS | Qty: 5 | Status: AC

## 2022-07-31 MED FILL — Dexamethasone Sodium Phosphate Inj 100 MG/10ML: INTRAMUSCULAR | Qty: 1 | Status: AC

## 2022-08-03 ENCOUNTER — Encounter: Payer: Self-pay | Admitting: *Deleted

## 2022-08-03 ENCOUNTER — Encounter: Payer: Self-pay | Admitting: Oncology

## 2022-08-03 ENCOUNTER — Inpatient Hospital Stay: Payer: Medicare Other | Attending: Oncology

## 2022-08-03 ENCOUNTER — Inpatient Hospital Stay (HOSPITAL_BASED_OUTPATIENT_CLINIC_OR_DEPARTMENT_OTHER): Payer: Medicare Other | Admitting: Oncology

## 2022-08-03 ENCOUNTER — Inpatient Hospital Stay: Payer: Medicare Other

## 2022-08-03 VITALS — BP 110/73 | HR 84 | Temp 97.9°F | Wt 195.4 lb

## 2022-08-03 DIAGNOSIS — Z5111 Encounter for antineoplastic chemotherapy: Secondary | ICD-10-CM | POA: Diagnosis present

## 2022-08-03 DIAGNOSIS — C782 Secondary malignant neoplasm of pleura: Secondary | ICD-10-CM | POA: Insufficient documentation

## 2022-08-03 DIAGNOSIS — C3490 Malignant neoplasm of unspecified part of unspecified bronchus or lung: Secondary | ICD-10-CM | POA: Diagnosis not present

## 2022-08-03 DIAGNOSIS — C7931 Secondary malignant neoplasm of brain: Secondary | ICD-10-CM | POA: Insufficient documentation

## 2022-08-03 DIAGNOSIS — C3411 Malignant neoplasm of upper lobe, right bronchus or lung: Secondary | ICD-10-CM | POA: Diagnosis present

## 2022-08-03 DIAGNOSIS — J910 Malignant pleural effusion: Secondary | ICD-10-CM | POA: Insufficient documentation

## 2022-08-03 DIAGNOSIS — Z5112 Encounter for antineoplastic immunotherapy: Secondary | ICD-10-CM | POA: Insufficient documentation

## 2022-08-03 DIAGNOSIS — Z79620 Long term (current) use of immunosuppressive biologic: Secondary | ICD-10-CM | POA: Insufficient documentation

## 2022-08-03 LAB — CBC WITH DIFFERENTIAL/PLATELET
Abs Immature Granulocytes: 0.01 10*3/uL (ref 0.00–0.07)
Basophils Absolute: 0 10*3/uL (ref 0.0–0.1)
Basophils Relative: 1 %
Eosinophils Absolute: 0.1 10*3/uL (ref 0.0–0.5)
Eosinophils Relative: 2 %
HCT: 34.4 % — ABNORMAL LOW (ref 39.0–52.0)
Hemoglobin: 11.7 g/dL — ABNORMAL LOW (ref 13.0–17.0)
Immature Granulocytes: 0 %
Lymphocytes Relative: 19 %
Lymphs Abs: 0.8 10*3/uL (ref 0.7–4.0)
MCH: 31 pg (ref 26.0–34.0)
MCHC: 34 g/dL (ref 30.0–36.0)
MCV: 91 fL (ref 80.0–100.0)
Monocytes Absolute: 0.8 10*3/uL (ref 0.1–1.0)
Monocytes Relative: 18 %
Neutro Abs: 2.6 10*3/uL (ref 1.7–7.7)
Neutrophils Relative %: 60 %
Platelets: 176 10*3/uL (ref 150–400)
RBC: 3.78 MIL/uL — ABNORMAL LOW (ref 4.22–5.81)
RDW: 14.5 % (ref 11.5–15.5)
WBC: 4.3 10*3/uL (ref 4.0–10.5)
nRBC: 0 % (ref 0.0–0.2)

## 2022-08-03 LAB — COMPREHENSIVE METABOLIC PANEL
ALT: 24 U/L (ref 0–44)
AST: 22 U/L (ref 15–41)
Albumin: 3.3 g/dL — ABNORMAL LOW (ref 3.5–5.0)
Alkaline Phosphatase: 66 U/L (ref 38–126)
Anion gap: 6 (ref 5–15)
BUN: 20 mg/dL (ref 8–23)
CO2: 25 mmol/L (ref 22–32)
Calcium: 8.6 mg/dL — ABNORMAL LOW (ref 8.9–10.3)
Chloride: 107 mmol/L (ref 98–111)
Creatinine, Ser: 0.98 mg/dL (ref 0.61–1.24)
GFR, Estimated: >60 mL/min (ref >60)
Glucose, Bld: 100 mg/dL — ABNORMAL HIGH (ref 70–99)
Potassium: 3.7 mmol/L (ref 3.5–5.1)
Sodium: 138 mmol/L (ref 135–145)
Total Bilirubin: 1.1 mg/dL (ref 0.3–1.2)
Total Protein: 6.7 g/dL (ref 6.5–8.1)

## 2022-08-03 LAB — TSH: TSH: 1.666 u[IU]/mL (ref 0.350–4.500)

## 2022-08-03 MED ORDER — HEPARIN SOD (PORK) LOCK FLUSH 100 UNIT/ML IV SOLN
500.0000 [IU] | Freq: Once | INTRAVENOUS | Status: AC
  Administered 2022-08-03: 500 [IU] via INTRAVENOUS
  Filled 2022-08-03: qty 5

## 2022-08-03 NOTE — Assessment & Plan Note (Addendum)
Imaging results and pathology results were reviewed and discussed with patient.  The diagnosis of stage IV non-small cell lung cancer and care plan were discussed with patient in detail.  The goal of adjuvant chemotherapy treatment is with palliative intent  Not enough biopsy tissue for NGS   Liquid biopsy showed no targetable mutation Labs are reviewed and discussed with patient. 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 .  Consider switching to HER2 target therapy, ie Tdxd He has a Telemedicine visit with [facility] tomorrow. Our office has called Dr. Kerrin Mo office, awaiting call back to discuss his treatment plan.  Addendum, patient had a phone discussion with [facility] Dr. Nicola Girt. Was recommended to resume carboplatin Alimta and Keytruda.  repeat CT chest abdomen pelvis for treatment response.

## 2022-08-03 NOTE — Assessment & Plan Note (Signed)
Status post paracentesis at [facility].  Status post pleural catheter placement.  

## 2022-08-03 NOTE — Progress Notes (Unsigned)
Hematology/Oncology Progress note Telephone:(336) [phone] Fax:(336) [phone]       CHIEF COMPLAINTS/PURPOSE OF CONSULTATION:  Metastatic non-small cell lung cancer, brain metastasis.   ASSESSMENT & PLAN:   Cancer Staging  Metastatic non-small cell lung cancer Staging form: Lung, AJCC 8th Edition - Clinical stage from 05/29/2022: Stage IV (cT3, cN2, cM1) - Signed by Kurt PatienceYu, Kurt Edmiston, MD on 06/03/2022    Metastatic non-small cell lung cancer Imaging results and pathology results were reviewed and discussed with patient.  The diagnosis of stage IV non-small cell lung cancer and care plan were discussed with patient in detail.  The goal of adjuvant chemotherapy treatment is with palliative intent  Not enough biopsy tissue for NGS   Liquid biopsy showed no targetable mutation Labs are reviewed and discussed with patient. Proceed with carboplatin Alimta, hold Keytruda, awaiting NGS on the second biopsy done [facility].       Follow-up   3 weeks lab MD chemotherapy All questions were answered. The patient knows to call the clinic with any problems, questions or concerns.  Kurt PatienceZhou Kurt Rosenkranz, MD, PhD [facility] 08/03/2022   HISTORY OF PRESENTING ILLNESS:  Kurt Ewing 67 y.o. male presents to establish care for metastatic lung cancer.  Patient is a lifelong never smoker.  Oncology History  Metastatic non-small cell lung cancer  04/30/2022 Imaging   CT chest w contrast showed  1. Perihilar right upper lobe masslike opacity measuring 3.4 x 5.6 x 3.8 cm, This abuts the major fissure 2. Moderate-sized right pleural effusion which is partially loculated tracks anteriorly the lower hemithorax. Possible occasional pleural enhancement and thickening posteriorly. 3. Rounded masslike opacity in the dependent right lower lobe measuring 5.8 x 4.1 cm 4. Additional bilateral pulmonary nodules, largest measuring 7 mm in the right upper lobe. 5. Prominent mediastinal and right hilar  lymph nodes.  6   Low-density 2.4 cm right adrenal nodule. This is unchanged from 12/15/2019 abdominal CT in typical of adenoma   05/05/2022 Procedure   status post ultrasound-guided right thoracentesis.  Cytology showed suspicious for malignancy, background lymphocyte predominant effusion.    05/13/2022 Imaging   PET scan initial staging showed 1. 5.9 by 3.5 cm right hilar mass with maximum SUV 5.7, compatible with malignancy. The epicenter of this mass appears to be in the right upper lobe. Faintly increased activity in the right hilar lymph node which could be involved. 2. Large right pleural effusion with pleural thickening and subtle accentuated activity along the pleural surface without a well-defined masslike pleural lesion. Correlate with recent thoracocentesis results. 3. Low-grade activity in the collapsed right lower lobe, maximum SUV 3.2, probably inflammatory. 4. No findings of malignancy outside of the chest. 5. 2.4 cm right adrenal adenoma does not require follow up. 6. 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 Lumbar spondylosis and degenerative disc disease. 7. Aortic atherosclerosis.   05/21/2022 Imaging   MRI brain with and without contrast showed multiple peripherally enhancing lesions in the right middle cerebral hemispheres, concerning for metastatic disease.   05/29/2022 Initial Diagnosis   Metastatic non-small cell lung cancer (HCC)  02/25/22 patient was seen by PCP and was diagnosed with bronchitis, was treated with Azithromycin.  Cough did not improve. He took levaquin for 10 days without improvement either.   05/29/2022 status post microscopy biopsy. Right upper lobe biopsy-non-small cell carcinoma, favor adenocarcinoma. Right lower lobe biopsy-nondiagnostic reactive bronchial cells Right upper lobe ENB assisted FNA, positive for malignancy.  Non-small cell carcinoma, favor adenocarcinoma. Right upper lobe lung ENB with  depression-positive for malignancy, non-small cell carcinoma is present Station 10R -positive for malignancy consistent with metastatic non-small cell carcinoma involving lymphoid material. Station 11 R-FNA showed suspicious for malignancy.  Single atypical epithelial groups in a hypocellular specimen Station 7-FNA showed positive for malignancy.  Consistent with metastatic non-small cell carcinoma involving lymphoid material   05/29/2022 Cancer Staging   Staging form: Lung, AJCC 8th Edition - Clinical stage from 05/29/2022: Stage IV (cT3, cN2, cM1) - Signed by Kurt Patience, MD on 06/03/2022 Stage prefix: Initial diagnosis   06/22/2022 -  Chemotherapy   Cycle 1 LUNG Carboplatin (5) + Pemetrexed (500) + Pembrolizumab (200) D1 q21d Induction x 6 Cycles  Starting cycle 2, patient receives carboplatin and pemetrexed.  Pembrolizumab is held awaiting for NGS     07/07/2022 Procedure   Second opinion at [facility].  Repeat biopsy of the right pleura.  Pathology is positive for metastatic adenocarcinoma.  Pleural fluid cytology was positive.  Patient had right pleural catheter placed.  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 male who has above history reviewed by me today presents for follow up visit for metastatic lung non small cell lung cancer.    Patient was accompanied by wife He feels well, no shortness of breath at rest S/p 2 cycles of chemotherapy. Tolerates well, denies nausea vomiting diarrhea.     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    SURGICAL HISTORY: Past Surgical History:  Procedure Laterality Date   COLONOSCOPY     IR IMAGING GUIDED PORT INSERTION  06/19/2022   IR REMOVAL OF PLURAL CATH W/CUFF  07/24/2022   KNEE ARTHROSCOPY Right 2005   TONSILLECTOMY  1970   VIDEO BRONCHOSCOPY  WITH ENDOBRONCHIAL ULTRASOUND N/A 05/29/2022   Procedure: VIDEO BRONCHOSCOPY WITH ENDOBRONCHIAL ULTRASOUND;  Surgeon: Vida Rigger, MD;  Location: [facility] ORS;  Service: Thoracic;  Laterality: N/A;    SOCIAL HISTORY: Social History   Socioeconomic History   Marital status: Married    Spouse name: Clydie Braun   Number of children: 3   Years of education: Not on file   Highest education level: Not on file  Occupational History   Not on file  Tobacco Use   Smoking status: Never   Smokeless tobacco: Never  Vaping Use   Vaping Use: Never used  Substance and Sexual Activity   Alcohol use: Yes    Comment: weekly   Drug use: Not Currently   Sexual activity: Not on file  Other Topics Concern   Not on file  Social History Narrative   Lives at home with wife Clydie Braun; 3 kids & 10 grandchildren    Social Determinants of Health   Financial Resource Strain: Not on file  Food Insecurity: No Food Insecurity (07/24/2022)   Hunger Vital Sign    Worried About Running Out of Food in the Last Year: Never true    Ran Out of Food in the Last Year: Never true  Transportation Needs: No Transportation Needs (07/24/2022)   PRAPARE - Administrator, Civil Service (Medical): No    Lack of Transportation (Non-Medical): No  Physical Activity: Not on file  Stress: Not on file  Social Connections: Not on  file  Intimate Partner Violence: Not At Risk (07/24/2022)   Humiliation, Afraid, Rape, and Kick questionnaire    Fear of Current or Ex-Partner: No    Emotionally Abused: No    Physically Abused: No    Sexually Abused: No    FAMILY HISTORY: Family History  Problem Relation Age of Onset   Hypertension Father    Heart disease Father    Hypertension Brother    COPD Brother     ALLERGIES:  has No Known Allergies.  MEDICATIONS:  Current Outpatient Medications  Medication Sig Dispense Refill   acetaminophen (TYLENOL) 500 MG tablet Take 500 mg by mouth every 6 (six) hours as needed for mild  pain.     chlorhexidine (HIBICLENS) 4 % external liquid Apply topically daily. Wash below neck 120 mL 0   famotidine (PEPCID) 40 MG tablet Take 1 tablet by mouth at bedtime.     folic acid (FOLVITE) 1 MG tablet Take 1 tablet (1 mg total) by mouth daily. Start 7 days before pemetrexed chemotherapy. Continue until 21 days after pemetrexed completed. 100 tablet 3   lidocaine-prilocaine (EMLA) cream Apply 1 Application topically as needed. 30 g 2   linezolid (ZYVOX) 600 MG tablet Take 1 tablet (600 mg total) by mouth every 12 (twelve) hours for 14 days. 28 tablet 0   melatonin 3 MG TABS tablet Take by mouth.     mupirocin cream (BACTROBAN) 2 % Apply 1 Application topically 2 (two) times daily. To the nares 15 g 0   lisinopril (ZESTRIL) 5 MG tablet Take 5 mg by mouth daily. (Patient not taking: Reported on 08/03/2022)     No current facility-administered medications for this visit.    Review of Systems  Constitutional:  Negative for appetite change, chills, fatigue, fever and unexpected weight change.  HENT:   Negative for hearing loss and voice change.   Eyes:  Negative for eye problems and icterus.  Respiratory:  Positive for cough. Negative for chest tightness and shortness of breath.   Cardiovascular:  Negative for chest pain and leg swelling.  Gastrointestinal:  Negative for abdominal distention and abdominal pain.  Endocrine: Negative for hot flashes.  Genitourinary:  Negative for difficulty urinating, dysuria and frequency.   Musculoskeletal:  Negative for arthralgias.  Skin:  Negative for itching and rash.  Neurological:  Negative for light-headedness and numbness.  Hematological:  Negative for adenopathy. Does not bruise/bleed easily.  Psychiatric/Behavioral:  Negative for confusion.      PHYSICAL EXAMINATION: ECOG PERFORMANCE STATUS: 0 - Asymptomatic  Vitals:   08/03/22 0916  BP: 110/73  Pulse: 84  Temp: 97.9 F (36.6 C)  SpO2: 100%   Filed Weights   08/03/22 0916   Weight: 195 lb 6.4 oz (88.6 kg)    Physical Exam Constitutional:      General: He is not in acute distress.    Appearance: He is not diaphoretic.  HENT:     Head: Normocephalic and atraumatic.  Eyes:     General: No scleral icterus. Cardiovascular:     Rate and Rhythm: Normal rate.     Heart sounds: No murmur heard. Pulmonary:     Effort: Pulmonary effort is normal. No respiratory distress.     Comments: Decreased right lower lung breath sound.  Right pleural catheter. Abdominal:     General: Bowel sounds are normal. There is no distension.     Palpations: Abdomen is soft.  Musculoskeletal:        General: Normal range of  motion.     Cervical back: Normal range of motion.  Skin:    General: Skin is warm and dry.     Findings: No erythema.  Neurological:     Mental Status: He is alert and oriented to person, place, and time. Mental status is at baseline.     Cranial Nerves: No cranial nerve deficit.     Motor: No abnormal muscle tone.  Psychiatric:        Mood and Affect: Affect normal.      LABORATORY DATA:  I have reviewed the data as listed    Latest Ref Rng & Units 08/03/2022    9:07 AM 07/29/2022   10:19 AM 07/25/2022    4:30 AM  CBC  WBC 4.0 - 10.5 K/uL 4.3  4.2  4.0   Hemoglobin 13.0 - 17.0 g/dL 51.7  61.6  07.3   Hematocrit 39.0 - 52.0 % 34.4  33.9  35.6   Platelets 150 - 400 K/uL 176  119  98       Latest Ref Rng & Units 08/03/2022    9:07 AM 07/29/2022   10:19 AM 07/25/2022    4:30 AM  CMP  Glucose 70 - 99 mg/dL 710  626  948   BUN 8 - 23 mg/dL 20  21  16    Creatinine 0.61 - 1.24 mg/dL 5.46  2.70  3.50   Sodium 135 - 145 mmol/L 138  136  136   Potassium 3.5 - 5.1 mmol/L 3.7  4.1  4.5   Chloride 98 - 111 mmol/L 107  103  100   CO2 22 - 32 mmol/L 25  27  25    Calcium 8.9 - 10.3 mg/dL 8.6  8.8  8.8   Total Protein 6.5 - 8.1 g/dL 6.7  6.4  5.9   Total Bilirubin 0.3 - 1.2 mg/dL 1.1  0.9  0.9   Alkaline Phos 38 - 126 U/L 66  59  61   AST 15 - 41 U/L 22  23   18    ALT 0 - 44 U/L 24  26  19       RADIOGRAPHIC STUDIES: I have personally reviewed the radiological images as listed and agreed with the findings in the report. IR Removal Of Plural Cath W/Cuff  Result Date: 07/24/2022 Zettie Cooley, RT     07/24/2022  2:28 PM Notified floor RN that Petroleum Gauze dressing should remain in place for 48hrs post procedure (07/24/22 @ 1417).  CT Chest W Contrast  Result Date: 07/23/2022 CLINICAL DATA:  Right-sided infection around site of chest tube. Drain was placed 2 weeks ago for pleural effusion. EXAM: CT CHEST WITH CONTRAST TECHNIQUE: Multidetector CT imaging of the chest was performed during intravenous contrast administration. RADIATION DOSE REDUCTION: This exam was performed according to the departmental dose-optimization program which includes automated exposure control, adjustment of the mA and/or kV according to patient size and/or use of iterative reconstruction technique. CONTRAST:  29mL OMNIPAQUE IOHEXOL 300 MG/ML  SOLN COMPARISON:  Chest radiograph 05/29/2022. CT chest 05/26/2022 and 04/30/2022 FINDINGS: Cardiovascular: Cardiac enlargement with prominence of the right heart and left atrium. No pericardial effusions. Coronary artery and aortic calcification. Normal caliber thoracic aorta. No dissection. Mediastinum/Nodes: Subcentimeter low-attenuation lesions in the right and left thyroid gland. No change since prior studies. No imaging follow-up is indicated. Esophagus is decompressed. No significant lymphadenopathy. Right-sided Infuse-A-Port with tip in the low SVC. Lungs/Pleura: Small right pleural effusion with pleural thickening, possibly loculated. A right  chest drain is in place centrally within the collection. Significant decrease in size of the collection since the prior studies. Emphysematous changes in the lungs. Atelectasis and scarring in the right base. Mass or consolidation in the right lower lung measuring 4.3 x 6.2 cm. Right  suprahilar mass lesion measuring 4.3 x 5 cm. Right upper lung pulmonary nodule measuring 8 mm diameter. Several right upper lung ground-glass nodules, largest measuring 6 mm. Mass lesions and nodules are similar to prior study although better seen today with smaller effusion. Multiple left upper lung nodules and superior segment left lower lung nodule. Largest measures 4 mm diameter. Some of these nodules appear calcified and may be postinflammatory. Upper Abdomen: No acute abnormalities are demonstrated. Musculoskeletal: Degenerative changes in the spine. No destructive bone lesions. IMPRESSION: 1. Decreasing small right pleural effusion with pleural thickening. Right chest tube is in place. No soft tissue abscess identified. 2. Suprahilar right lung mass is unchanged since prior study. Additional mass or consolidation in the right lung base is also unchanged. Atelectasis in the right base. 3. Additional right pulmonary nodules are likely metastatic. Subcentimeter left pulmonary nodules, some of which are calcified, likely postinflammatory. 4. Cardiac enlargement.  Aortic atherosclerosis. Electronically Signed   By: Burman Nieves M.D.   On: 07/23/2022 20:41

## 2022-08-03 NOTE — Assessment & Plan Note (Signed)
S/p  palliative radiation, tapered off steroid.  Continue observation. Repeat MRI brain in future

## 2022-08-04 ENCOUNTER — Inpatient Hospital Stay: Payer: Medicare Other | Admitting: Infectious Diseases

## 2022-08-05 ENCOUNTER — Telehealth: Payer: Self-pay | Admitting: Oncology

## 2022-08-05 ENCOUNTER — Other Ambulatory Visit: Payer: Self-pay | Admitting: Oncology

## 2022-08-05 ENCOUNTER — Encounter: Payer: Self-pay | Admitting: Oncology

## 2022-08-05 ENCOUNTER — Ambulatory Visit
Admission: RE | Admit: 2022-08-05 | Discharge: 2022-08-05 | Disposition: A | Discharge status: Home / Self Care | Payer: Medicare Other | Source: Ambulatory Visit | Attending: Oncology | Admitting: Oncology

## 2022-08-05 ENCOUNTER — Other Ambulatory Visit: Payer: Self-pay

## 2022-08-05 DIAGNOSIS — C3490 Malignant neoplasm of unspecified part of unspecified bronchus or lung: Secondary | ICD-10-CM | POA: Diagnosis present

## 2022-08-05 LAB — T4: T4, Total: 7.3 ug/dL (ref 4.5–12.0)

## 2022-08-05 MED ORDER — IOHEXOL 300 MG/ML  SOLN
80.0000 mL | Freq: Once | INTRAMUSCULAR | Status: AC | PRN
  Administered 2022-08-05: 100 mL via INTRAVENOUS

## 2022-08-05 NOTE — Telephone Encounter (Signed)
PER Swift:no to all question. for STAT CT

## 2022-08-05 NOTE — Assessment & Plan Note (Signed)
Treatment plan as discussed above.  

## 2022-08-05 NOTE — Addendum Note (Signed)
Addended by: Rickard Patience on: 08/05/2022 08:22 AM   Modules accepted: Orders

## 2022-08-05 NOTE — Telephone Encounter (Signed)
Please schedule and call pt with appt details.   Please obtain STAT CT chest abdomen pelvis w contrast. CT before his 4/15 treatment.  Please arrange him for the following: 4/15 lab chemo carboplatin Alimta + Keytruda, let me know if no availability. 5/6/ lab MD chemo carboplatin Alimta + Keytruda Chemo IS has been updated.

## 2022-08-07 ENCOUNTER — Other Ambulatory Visit: Payer: Self-pay

## 2022-08-07 MED FILL — Fosaprepitant Dimeglumine For IV Infusion 150 MG (Base Eq): INTRAVENOUS | Qty: 5 | Status: AC

## 2022-08-07 MED FILL — Dexamethasone Sodium Phosphate Inj 100 MG/10ML: INTRAMUSCULAR | Qty: 1 | Status: AC

## 2022-08-10 ENCOUNTER — Encounter: Payer: Self-pay | Admitting: *Deleted

## 2022-08-10 ENCOUNTER — Inpatient Hospital Stay: Payer: Medicare Other

## 2022-08-10 DIAGNOSIS — Z5112 Encounter for antineoplastic immunotherapy: Secondary | ICD-10-CM | POA: Diagnosis not present

## 2022-08-10 DIAGNOSIS — C3490 Malignant neoplasm of unspecified part of unspecified bronchus or lung: Secondary | ICD-10-CM

## 2022-08-10 LAB — COMPREHENSIVE METABOLIC PANEL
ALT: 18 U/L (ref 0–44)
AST: 22 U/L (ref 15–41)
Albumin: 3.3 g/dL — ABNORMAL LOW (ref 3.5–5.0)
Alkaline Phosphatase: 71 U/L (ref 38–126)
Anion gap: 6 (ref 5–15)
BUN: 14 mg/dL (ref 8–23)
CO2: 25 mmol/L (ref 22–32)
Calcium: 8.5 mg/dL — ABNORMAL LOW (ref 8.9–10.3)
Chloride: 104 mmol/L (ref 98–111)
Creatinine, Ser: 1.03 mg/dL (ref 0.61–1.24)
GFR, Estimated: >60 mL/min (ref >60)
Glucose, Bld: 136 mg/dL — ABNORMAL HIGH (ref 70–99)
Potassium: 4 mmol/L (ref 3.5–5.1)
Sodium: 135 mmol/L (ref 135–145)
Total Bilirubin: 1 mg/dL (ref 0.3–1.2)
Total Protein: 6.6 g/dL (ref 6.5–8.1)

## 2022-08-10 LAB — CBC WITH DIFFERENTIAL/PLATELET
Abs Immature Granulocytes: 0.08 10*3/uL — ABNORMAL HIGH (ref 0.00–0.07)
Basophils Absolute: 0.1 10*3/uL (ref 0.0–0.1)
Basophils Relative: 1 %
Eosinophils Absolute: 0.1 10*3/uL (ref 0.0–0.5)
Eosinophils Relative: 2 %
HCT: 33.8 % — ABNORMAL LOW (ref 39.0–52.0)
Hemoglobin: 11.6 g/dL — ABNORMAL LOW (ref 13.0–17.0)
Immature Granulocytes: 1 %
Lymphocytes Relative: 14 %
Lymphs Abs: 1 10*3/uL (ref 0.7–4.0)
MCH: 31.4 pg (ref 26.0–34.0)
MCHC: 34.3 g/dL (ref 30.0–36.0)
MCV: 91.4 fL (ref 80.0–100.0)
Monocytes Absolute: 0.9 10*3/uL (ref 0.1–1.0)
Monocytes Relative: 13 %
Neutro Abs: 4.8 10*3/uL (ref 1.7–7.7)
Neutrophils Relative %: 69 %
Platelets: 219 10*3/uL (ref 150–400)
RBC: 3.7 MIL/uL — ABNORMAL LOW (ref 4.22–5.81)
RDW: 15.7 % — ABNORMAL HIGH (ref 11.5–15.5)
WBC: 6.9 10*3/uL (ref 4.0–10.5)
nRBC: 0 % (ref 0.0–0.2)

## 2022-08-10 MED ORDER — HEPARIN SOD (PORK) LOCK FLUSH 100 UNIT/ML IV SOLN
500.0000 [IU] | Freq: Once | INTRAVENOUS | Status: AC
  Administered 2022-08-10: 500 [IU] via INTRAVENOUS
  Filled 2022-08-10: qty 5

## 2022-08-10 MED ORDER — PALONOSETRON HCL INJECTION 0.25 MG/5ML
0.2500 mg | Freq: Once | INTRAVENOUS | Status: AC
  Administered 2022-08-10: 0.25 mg via INTRAVENOUS
  Filled 2022-08-10: qty 5

## 2022-08-10 MED ORDER — SODIUM CHLORIDE 0.9 % IV SOLN
200.0000 mg | Freq: Once | INTRAVENOUS | Status: AC
  Administered 2022-08-10: 200 mg via INTRAVENOUS
  Filled 2022-08-10: qty 8

## 2022-08-10 MED ORDER — SODIUM CHLORIDE 0.9 % IV SOLN
Freq: Once | INTRAVENOUS | Status: AC
  Filled 2022-08-10: qty 250

## 2022-08-10 MED ORDER — SODIUM CHLORIDE 0.9 % IV SOLN
10.0000 mg | Freq: Once | INTRAVENOUS | Status: AC
  Administered 2022-08-10: 10 mg via INTRAVENOUS
  Filled 2022-08-10: qty 10
  Filled 2022-08-10: qty 1

## 2022-08-10 MED ORDER — CYANOCOBALAMIN 1000 MCG/ML IJ SOLN
1000.0000 ug | Freq: Once | INTRAMUSCULAR | Status: AC
  Administered 2022-08-10: 1000 ug via INTRAMUSCULAR
  Filled 2022-08-10: qty 1

## 2022-08-10 MED ORDER — HEPARIN SOD (PORK) LOCK FLUSH 100 UNIT/ML IV SOLN
500.0000 [IU] | Freq: Once | INTRAVENOUS | Status: AC | PRN
  Filled 2022-08-10: qty 5

## 2022-08-10 MED ORDER — SODIUM CHLORIDE 0.9 % IV SOLN
500.0000 mg/m2 | Freq: Once | INTRAVENOUS | Status: AC
  Administered 2022-08-10: 1100 mg via INTRAVENOUS
  Filled 2022-08-10: qty 40

## 2022-08-10 MED ORDER — SODIUM CHLORIDE 0.9 % IV SOLN
566.5000 mg | Freq: Once | INTRAVENOUS | Status: AC
  Administered 2022-08-10: 570 mg via INTRAVENOUS
  Filled 2022-08-10: qty 57

## 2022-08-10 MED ORDER — SODIUM CHLORIDE 0.9 % IV SOLN
150.0000 mg | Freq: Once | INTRAVENOUS | Status: AC
  Administered 2022-08-10: 150 mg via INTRAVENOUS
  Filled 2022-08-10: qty 5
  Filled 2022-08-10: qty 150

## 2022-08-10 MED ORDER — SODIUM CHLORIDE 0.9% FLUSH
10.0000 mL | INTRAVENOUS | Status: AC | PRN
  Administered 2022-08-10: 10 mL via INTRAVENOUS
  Filled 2022-08-10: qty 10

## 2022-08-10 NOTE — Patient Instructions (Signed)
[place] [facility]  Discharge Instructions: Thank you for choosing [facility] to provide your oncology and hematology care.  If you have a lab appointment with the Cancer Center, please go directly to the Cancer Center and check in at the registration area.  Wear comfortable clothing and clothing appropriate for easy access to any Portacath or PICC line.   We strive to give you quality time with your provider. You may need to reschedule your appointment if you arrive late (15 or more minutes).  Arriving late affects you and other patients whose appointments are after yours.  Also, if you miss three or more appointments without notifying the office, you may be dismissed from the clinic at the provider's discretion.      For prescription refill requests, have your pharmacy contact our office and allow 72 hours for refills to be completed.    Today you received the following chemotherapy and/or immunotherapy agents KEYTRUDA, ALIMTA and CARBOPLATIN      To help prevent nausea and vomiting after your treatment, we encourage you to take your nausea medication as directed.  BELOW ARE SYMPTOMS THAT SHOULD BE REPORTED IMMEDIATELY: *FEVER GREATER THAN 100.4 F (38 C) OR HIGHER *CHILLS OR SWEATING *NAUSEA AND VOMITING THAT IS NOT CONTROLLED WITH YOUR NAUSEA MEDICATION *UNUSUAL SHORTNESS OF BREATH *UNUSUAL BRUISING OR BLEEDING *URINARY PROBLEMS (pain or burning when urinating, or frequent urination) *BOWEL PROBLEMS (unusual diarrhea, constipation, pain near the anus) TENDERNESS IN MOUTH AND THROAT WITH OR WITHOUT PRESENCE OF ULCERS (sore throat, sores in mouth, or a toothache) UNUSUAL RASH, SWELLING OR PAIN  UNUSUAL VAGINAL DISCHARGE OR ITCHING   Items with * indicate a potential emergency and should be followed up as soon as possible or go to the Emergency Department if any problems should occur.  Please show the CHEMOTHERAPY ALERT CARD or IMMUNOTHERAPY  ALERT CARD at check-in to the Emergency Department and triage nurse.  Should you have questions after your visit or need to cancel or reschedule your appointment, please contact [place] [facility]  [phone] and follow the prompts.  Office hours are 8:00 a.m. to 4:30 p.m. Monday - Friday. Please note that voicemails left after 4:00 p.m. may not be returned until the following business day.  We are closed weekends and major holidays. You have access to a nurse at all times for urgent questions. Please call the main number to the clinic [phone] and follow the prompts.  For any non-urgent questions, you may also contact your provider using MyChart. We now offer e-Visits for anyone 44 and older to request care online for non-urgent symptoms. For details visit [email or web address].   Also download the MyChart app! Go to the app store, search "MyChart", open the app, select , and log in with your MyChart username and password.

## 2022-08-11 ENCOUNTER — Other Ambulatory Visit: Payer: Self-pay

## 2022-08-11 ENCOUNTER — Encounter: Payer: Self-pay | Admitting: *Deleted

## 2022-08-11 DIAGNOSIS — C3490 Malignant neoplasm of unspecified part of unspecified bronchus or lung: Secondary | ICD-10-CM

## 2022-08-11 DIAGNOSIS — R059 Cough, unspecified: Secondary | ICD-10-CM

## 2022-08-14 ENCOUNTER — Telehealth: Payer: Self-pay

## 2022-08-14 ENCOUNTER — Ambulatory Visit
Admission: RE | Admit: 2022-08-14 | Discharge: 2022-08-14 | Disposition: A | Discharge status: Home / Self Care | Payer: Medicare Other | Source: Ambulatory Visit | Attending: Oncology | Admitting: Oncology

## 2022-08-14 ENCOUNTER — Other Ambulatory Visit: Payer: Self-pay | Admitting: Oncology

## 2022-08-14 DIAGNOSIS — R059 Cough, unspecified: Secondary | ICD-10-CM | POA: Insufficient documentation

## 2022-08-14 DIAGNOSIS — C3490 Malignant neoplasm of unspecified part of unspecified bronchus or lung: Secondary | ICD-10-CM | POA: Insufficient documentation

## 2022-08-14 MED ORDER — LEVOFLOXACIN 750 MG PO TABS: 750.0000 mg | ORAL_TABLET | Freq: Every day | ORAL | 0 refills | Status: DC

## 2022-08-14 NOTE — Telephone Encounter (Signed)
No answer on patient's phone so spoke to patient's wife regarding CT xray. Per Dr. Cathie Hoops  xray showed small pleural effusion, not enough to tap. Possible brochitis. Informed her Rx of antibiotics sent to pharmacy. Wife verbalizes understanding. No questions expressed.

## 2022-08-16 ENCOUNTER — Other Ambulatory Visit: Payer: Self-pay

## 2022-08-18 ENCOUNTER — Other Ambulatory Visit: Payer: Self-pay | Admitting: Oncology

## 2022-08-18 ENCOUNTER — Encounter: Payer: Self-pay | Admitting: *Deleted

## 2022-08-24 ENCOUNTER — Other Ambulatory Visit: Payer: Self-pay

## 2022-08-27 ENCOUNTER — Other Ambulatory Visit: Payer: Self-pay

## 2022-08-31 ENCOUNTER — Other Ambulatory Visit: Payer: Medicare Other

## 2022-08-31 ENCOUNTER — Ambulatory Visit: Payer: Medicare Other

## 2022-08-31 ENCOUNTER — Ambulatory Visit: Payer: Medicare Other | Admitting: Oncology

## 2022-09-09 ENCOUNTER — Other Ambulatory Visit: Payer: Self-pay

## 2022-09-22 ENCOUNTER — Ambulatory Visit: Payer: Medicare Other

## 2022-09-22 ENCOUNTER — Ambulatory Visit: Payer: Medicare Other | Admitting: Oncology

## 2022-09-22 ENCOUNTER — Other Ambulatory Visit: Payer: Medicare Other

## 2022-10-02 ENCOUNTER — Encounter: Payer: Self-pay | Admitting: *Deleted

## 2022-10-06 ENCOUNTER — Other Ambulatory Visit: Payer: Self-pay

## 2022-10-07 ENCOUNTER — Other Ambulatory Visit: Payer: Self-pay

## 2022-10-08 ENCOUNTER — Inpatient Hospital Stay: Payer: Medicare Other | Admitting: Oncology

## 2022-10-09 ENCOUNTER — Inpatient Hospital Stay: Payer: Medicare Other | Attending: Oncology | Admitting: Oncology

## 2022-10-09 ENCOUNTER — Encounter: Payer: Self-pay | Admitting: Oncology

## 2022-10-09 VITALS — BP 129/76 | HR 85 | Temp 98.4°F | Resp 18 | Wt 186.8 lb

## 2022-10-09 DIAGNOSIS — Z95828 Presence of other vascular implants and grafts: Secondary | ICD-10-CM

## 2022-10-09 DIAGNOSIS — Z5189 Encounter for other specified aftercare: Secondary | ICD-10-CM | POA: Insufficient documentation

## 2022-10-09 DIAGNOSIS — Z79620 Long term (current) use of immunosuppressive biologic: Secondary | ICD-10-CM | POA: Diagnosis not present

## 2022-10-09 DIAGNOSIS — Z5111 Encounter for antineoplastic chemotherapy: Secondary | ICD-10-CM | POA: Insufficient documentation

## 2022-10-09 DIAGNOSIS — Z5112 Encounter for antineoplastic immunotherapy: Secondary | ICD-10-CM | POA: Diagnosis present

## 2022-10-09 DIAGNOSIS — C3490 Malignant neoplasm of unspecified part of unspecified bronchus or lung: Secondary | ICD-10-CM

## 2022-10-09 DIAGNOSIS — C7931 Secondary malignant neoplasm of brain: Secondary | ICD-10-CM

## 2022-10-09 DIAGNOSIS — C3411 Malignant neoplasm of upper lobe, right bronchus or lung: Secondary | ICD-10-CM | POA: Insufficient documentation

## 2022-10-09 NOTE — Progress Notes (Signed)
Hematology/Oncology Progress note [phone] [phone]       CHIEF COMPLAINTS/PURPOSE OF CONSULTATION:  Metastatic non-small cell lung cancer, brain metastasis.   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 Nicola Girt . Marland Kitchen  CT scan done at [facility] showed good partial response.  He has been on maintenance Pemetrexed + Keytruda starting cycle 5.  I agree with [facility] to continuing current regimen given excellent tolerance and improvement in majority of disease.  Plan cycle 6 Pemetrexed + Keytruda around 10/20/22    Metastasis to brain [facility]) S/p  palliative radiation, MRI brain was done at [facility] showed treatment response.  Continue observation. He will have follow up MRI brain at [facility]    Follow-up  10/21/22 lab [facility] chemo All questions were answered. The patient knows to call the clinic with any problems, questions or concerns.  Rickard Patience, MD, PhD [facility] Hematology Oncology 10/09/2022   HISTORY OF PRESENTING ILLNESS:  Kurt Ewing 67 y.o. male presents to establish care for metastatic lung cancer.  Patient is a lifelong never smoker.  Oncology History  Metastatic non-small cell lung cancer (HCC)  04/30/2022 Imaging   CT chest w contrast showed  1. Perihilar right upper lobe masslike opacity measuring 3.4 x 5.6 x 3.8 cm, This abuts the major fissure 2. Moderate-sized right pleural effusion which is partially loculated tracks anteriorly the lower hemithorax. Possible occasional pleural enhancement and thickening posteriorly. 3. Rounded masslike opacity in the  dependent right lower lobe measuring 5.8 x 4.1 cm 4. Additional bilateral pulmonary nodules, largest measuring 7 mm in the right upper lobe. 5. Prominent mediastinal and right hilar lymph nodes.  6   Low-density 2.4 cm right adrenal nodule. This is unchanged from 12/15/2019 abdominal CT in typical of adenoma   05/05/2022 Procedure   status post ultrasound-guided right thoracentesis.  Cytology showed suspicious for malignancy, background lymphocyte predominant effusion.    05/13/2022 Imaging   PET scan initial staging showed 1. 5.9 by 3.5 cm right hilar mass with maximum SUV 5.7, compatible with malignancy. The epicenter of this mass appears to be in the right upper lobe. Faintly increased activity in the right hilar lymph node which could be involved. 2. Large right pleural effusion with pleural thickening and subtle accentuated activity along the pleural surface without a well-defined masslike pleural lesion. Correlate with recent thoracocentesis results. 3. Low-grade activity in the collapsed right lower lobe, maximum SUV 3.2, probably inflammatory. 4. No findings of malignancy outside of the chest. 5. 2.4 cm right adrenal adenoma does not require follow up. 6. 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 Lumbar spondylosis and degenerative disc disease. 7. Aortic atherosclerosis.   05/21/2022 Imaging   MRI brain with and without contrast showed multiple peripherally enhancing lesions in the right middle cerebral hemispheres, concerning for metastatic disease.   05/29/2022 Initial Diagnosis   Metastatic non-small cell lung cancer (HCC)  02/25/22 patient was seen by PCP and was diagnosed with bronchitis, was treated with Azithromycin.  Cough did not improve. He took levaquin for 10  days without improvement either.   05/29/2022 status post microscopy biopsy. Right upper lobe biopsy-non-small cell carcinoma, favor adenocarcinoma. Right lower lobe  biopsy-nondiagnostic reactive bronchial cells Right upper lobe ENB assisted FNA, positive for malignancy.  Non-small cell carcinoma, favor adenocarcinoma. Right upper lobe lung ENB with depression-positive for malignancy, non-small cell carcinoma is present Station 10R -positive for malignancy consistent with metastatic non-small cell carcinoma involving lymphoid material. Station 11 R-FNA showed suspicious for malignancy.  Single atypical epithelial groups in a hypocellular specimen Station 7-FNA showed positive for malignancy.  Consistent with metastatic non-small cell carcinoma involving lymphoid material   05/29/2022 Cancer Staging   Staging form: Lung, AJCC 8th Edition - Clinical stage from 05/29/2022: Stage IV (cT3, cN2, cM1) - Signed by Rickard Patience, MD on 06/03/2022 Stage prefix: Initial diagnosis   06/22/2022 -  Chemotherapy   Cycle 1 LUNG Carboplatin + Pemetrexed + Keytruda D1 q21d Induction x 6 Cycles  Starting cycle 2, patient receives carboplatin and pemetrexed Cycle 3 and Cycle 4 patient elected to go to [facility] and received Carboplatin + Pemetrexed + Keytruda Cycle 5 Pemetrexed + Keytruda at [facility].      07/07/2022 Procedure   Second opinion at [facility].  Repeat biopsy of the right pleura.  Pathology is positive for metastatic adenocarcinoma.  Pleural fluid cytology was positive.  Patient had right pleural catheter placed.  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  Numerous (approximately 20) enhancing lesions with mixed interval change relative to 05/21/2022, as detailed above.  2.  No midline shift or hydrocephalus. No evidence of leptomeningeal disease.    09/29/2022 Imaging   CT chest abdomen pelvis w contrast  Interval decrease in size of dominant right upper lobe mass.  Unchanged right anterior peridiaphragmatic lymph node suspicious for nodal metastases  Stable small right pleural  effusion. Nodular right pleural thickening consistent with pleural metastasis  Increase in size of some of the pulmonary metastases     INTERVAL HISTORY LUISFELIPE YATSKO is a 67 y.o. male who has above history reviewed by me today presents for follow up visit for metastatic lung non small cell lung cancer.    Patient was accompanied by wife He feels well, no shortness of breath at rest, weight is stable.  He elected to go to [facility] for chemotherapy Extensive oncology records review at [facility] were performed by me.  He received cycle 3 and 4 carboplatin Pemetrexed + Keytruda, and cycle 5 on 09/29/2022 Pemetrexed + Keytruda there. He prefers to re-establish care and get chemotherapy locally. He plans to continue follow up with [facility] every 9 weeks and ger surveillance CT there.     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    SURGICAL HISTORY: Past Surgical History:  Procedure Laterality Date   COLONOSCOPY     IR IMAGING GUIDED PORT INSERTION  06/19/2022   IR REMOVAL OF PLURAL CATH W/CUFF  07/24/2022   KNEE ARTHROSCOPY Right 2005   TONSILLECTOMY  1970   VIDEO BRONCHOSCOPY WITH ENDOBRONCHIAL ULTRASOUND N/A 05/29/2022   Procedure: VIDEO BRONCHOSCOPY WITH ENDOBRONCHIAL ULTRASOUND;  Surgeon: Vida Rigger, MD;  Location: [facility] ORS;  Service: Thoracic;  Laterality: N/A;    SOCIAL HISTORY: Social History   Socioeconomic History  Marital status: Married    Spouse name: Clydie Braun   Number of children: 3   Years of education: Not on file   Highest education level: Not on file  Occupational History   Not on file  Tobacco Use   Smoking status: Never   Smokeless tobacco: Never  Vaping Use   Vaping Use: Never used  Substance and Sexual Activity   Alcohol use: Yes    Comment: weekly   Drug use: Not Currently   Sexual activity: Not on file   Other Topics Concern   Not on file  Social History Narrative   Lives at home with wife Clydie Braun; 3 kids & 10 grandchildren    Social Determinants of Health   Financial Resource Strain: Not on file  Food Insecurity: No Food Insecurity (07/24/2022)   Hunger Vital Sign    Worried About Running Out of Food in the Last Year: Never true    Ran Out of Food in the Last Year: Never true  Transportation Needs: No Transportation Needs (07/24/2022)   PRAPARE - Administrator, Civil Service (Medical): No    Lack of Transportation (Non-Medical): No  Physical Activity: Not on file  Stress: Not on file  Social Connections: Not on file  Intimate Partner Violence: Not At Risk (07/24/2022)   Humiliation, Afraid, Rape, and Kick questionnaire    Fear of Current or Ex-Partner: No    Emotionally Abused: No    Physically Abused: No    Sexually Abused: No    FAMILY HISTORY: Family History  Problem Relation Age of Onset   Hypertension Father    Heart disease Father    Hypertension Brother    COPD Brother     ALLERGIES:  has No Known Allergies.  MEDICATIONS:  Current Outpatient Medications  Medication Sig Dispense Refill   acetaminophen (TYLENOL) 500 MG tablet Take 500 mg by mouth every 6 (six) hours as needed for mild pain.     famotidine (PEPCID) 40 MG tablet Take 1 tablet by mouth at bedtime.     folic acid (FOLVITE) 1 MG tablet Take 1 tablet (1 mg total) by mouth daily. Start 7 days before pemetrexed chemotherapy. Continue until 21 days after pemetrexed completed. 100 tablet 3   melatonin 3 MG TABS tablet Take by mouth.     ondansetron (ZOFRAN) 8 MG tablet Take 8 mg by mouth every 8 (eight) hours as needed for nausea or vomiting.     chlorhexidine (HIBICLENS) 4 % external liquid Apply topically daily. Wash below neck (Patient not taking: Reported on 10/09/2022) 120 mL 0   lidocaine-prilocaine (EMLA) cream Apply 1 Application topically as needed. (Patient not taking: Reported on  10/09/2022) 30 g 2   No current facility-administered medications for this visit.   Facility-Administered Medications Ordered in Other Visits  Medication Dose Route Frequency Provider Last Rate Last Admin   heparin lock flush 100 unit/mL  500 Units Intracatheter Once PRN Rickard Patience, MD       sodium chloride flush (NS) 0.9 % injection 10 mL  10 mL Intravenous PRN Rickard Patience, MD   10 mL at 08/10/22 5409    Review of Systems  Constitutional:  Negative for appetite change, chills, fatigue, fever and unexpected weight change.  HENT:   Negative for hearing loss and voice change.   Eyes:  Negative for eye problems and icterus.  Respiratory:  Negative for chest tightness, cough and shortness of breath.   Cardiovascular:  Negative for chest pain and  leg swelling.  Gastrointestinal:  Negative for abdominal distention and abdominal pain.  Endocrine: Negative for hot flashes.  Genitourinary:  Negative for difficulty urinating, dysuria and frequency.   Musculoskeletal:  Negative for arthralgias.  Skin:  Negative for itching and rash.  Neurological:  Negative for light-headedness and numbness.  Hematological:  Negative for adenopathy. Does not bruise/bleed easily.  Psychiatric/Behavioral:  Negative for confusion.      PHYSICAL EXAMINATION: ECOG PERFORMANCE STATUS: 0 - Asymptomatic  Vitals:   10/09/22 1057  BP: 129/76  Pulse: 85  Resp: 18  Temp: 98.4 F (36.9 C)   Filed Weights   10/09/22 1057  Weight: 186 lb 12.8 oz (84.7 kg)    Physical Exam Constitutional:      General: He is not in acute distress.    Appearance: He is not diaphoretic.  HENT:     Head: Normocephalic and atraumatic.  Eyes:     General: No scleral icterus. Cardiovascular:     Rate and Rhythm: Normal rate.     Heart sounds: No murmur heard. Pulmonary:     Effort: Pulmonary effort is normal. No respiratory distress.     Comments: Decreased right lower lung breath sound Abdominal:     General: Bowel sounds are  normal. There is no distension.     Palpations: Abdomen is soft.  Musculoskeletal:        General: Normal range of motion.     Cervical back: Normal range of motion.  Skin:    General: Skin is warm and dry.     Findings: No erythema.  Neurological:     Mental Status: He is alert and oriented to person, place, and time. Mental status is at baseline.     Cranial Nerves: No cranial nerve deficit.     Motor: No abnormal muscle tone.  Psychiatric:        Mood and Affect: Mood and affect normal.      LABORATORY DATA:  I have reviewed the data as listed    Latest Ref Rng & Units 08/10/2022    9:12 AM 08/03/2022    9:07 AM 07/29/2022   10:19 AM  CBC  WBC 4.0 - 10.5 K/uL 6.9  4.3  4.2   Hemoglobin 13.0 - 17.0 g/dL 16.1  09.6  04.5   Hematocrit 39.0 - 52.0 % 33.8  34.4  33.9   Platelets 150 - 400 K/uL 219  176  119       Latest Ref Rng & Units 08/10/2022    9:12 AM 08/03/2022    9:07 AM 07/29/2022   10:19 AM  CMP  Glucose 70 - 99 mg/dL 409  811  914   BUN 8 - 23 mg/dL 14  20  21    Creatinine 0.61 - 1.24 mg/dL 7.82  9.56  2.13   Sodium 135 - 145 mmol/L 135  138  136   Potassium 3.5 - 5.1 mmol/L 4.0  3.7  4.1   Chloride 98 - 111 mmol/L 104  107  103   CO2 22 - 32 mmol/L 25  25  27    Calcium 8.9 - 10.3 mg/dL 8.5  8.6  8.8   Total Protein 6.5 - 8.1 g/dL 6.6  6.7  6.4   Total Bilirubin 0.3 - 1.2 mg/dL 1.0  1.1  0.9   Alkaline Phos 38 - 126 U/L 71  66  59   AST 15 - 41 U/L 22  22  23    ALT 0 - 44  U/L 18  24  26       RADIOGRAPHIC STUDIES: I have personally reviewed the radiological images as listed and agreed with the findings in the report. No results found.

## 2022-10-09 NOTE — Assessment & Plan Note (Signed)
S/p  palliative radiation, MRI brain was done at [facility] showed treatment response.  Continue observation. He will have follow up MRI brain at [facility]

## 2022-10-09 NOTE — Assessment & Plan Note (Addendum)
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 Altan] . Marland Kitchen  CT scan done at [facility] showed good partial response.  He has been on maintenance Pemetrexed + Keytruda starting cycle 5.  I agree with [facility] to continuing current regimen given excellent tolerance and improvement in majority of disease.  Plan cycle 6 Pemetrexed + Keytruda around 10/20/22

## 2022-10-09 NOTE — Progress Notes (Signed)
Pt here for follow up. He was getting chemo at [facility], but would like to re establish care here. He is due for next tx the week of 6/24. Pt reports that port a cath malfunctioned about 6 weeks ago it has not been used since.

## 2022-10-10 ENCOUNTER — Other Ambulatory Visit: Payer: Self-pay

## 2022-10-15 ENCOUNTER — Encounter: Payer: Self-pay | Admitting: *Deleted

## 2022-10-16 ENCOUNTER — Ambulatory Visit
Admission: RE | Admit: 2022-10-16 | Discharge: 2022-10-16 | Disposition: A | Discharge status: Home / Self Care | Payer: Medicare Other | Source: Ambulatory Visit | Attending: Oncology | Admitting: Oncology

## 2022-10-16 ENCOUNTER — Other Ambulatory Visit: Payer: Self-pay | Admitting: Oncology

## 2022-10-16 ENCOUNTER — Encounter: Payer: Self-pay | Admitting: *Deleted

## 2022-10-16 DIAGNOSIS — Z452 Encounter for adjustment and management of vascular access device: Secondary | ICD-10-CM | POA: Insufficient documentation

## 2022-10-16 DIAGNOSIS — Z95828 Presence of other vascular implants and grafts: Secondary | ICD-10-CM

## 2022-10-16 HISTORY — PX: IR CV LINE INJECTION: IMG2294

## 2022-10-16 MED ORDER — HEPARIN SOD (PORK) LOCK FLUSH 100 UNIT/ML IV SOLN
INTRAVENOUS | Status: AC
  Filled 2022-10-16: qty 5

## 2022-10-16 MED ORDER — HEPARIN SOD (PORK) LOCK FLUSH 100 UNIT/ML IV SOLN
500.0000 [IU] | Freq: Once | INTRAVENOUS | Status: AC
  Administered 2022-10-16: 500 [IU] via INTRAVENOUS

## 2022-10-16 MED ORDER — IOHEXOL 300 MG/ML  SOLN
10.0000 mL | Freq: Once | INTRAMUSCULAR | Status: AC | PRN
  Administered 2022-10-16: 10 mL

## 2022-10-19 MED FILL — Dexamethasone Sodium Phosphate Inj 100 MG/10ML: INTRAMUSCULAR | Qty: 1 | Status: AC

## 2022-10-19 MED FILL — Fosaprepitant Dimeglumine For IV Infusion 150 MG (Base Eq): INTRAVENOUS | Qty: 5 | Status: AC

## 2022-10-20 ENCOUNTER — Inpatient Hospital Stay: Payer: Medicare Other

## 2022-10-20 ENCOUNTER — Encounter: Payer: Self-pay | Admitting: Oncology

## 2022-10-20 ENCOUNTER — Telehealth: Payer: Self-pay

## 2022-10-20 ENCOUNTER — Inpatient Hospital Stay (HOSPITAL_BASED_OUTPATIENT_CLINIC_OR_DEPARTMENT_OTHER): Payer: Medicare Other | Admitting: Oncology

## 2022-10-20 VITALS — BP 125/91 | HR 73 | Temp 96.7°F | Resp 18 | Wt 189.0 lb

## 2022-10-20 DIAGNOSIS — Z5112 Encounter for antineoplastic immunotherapy: Secondary | ICD-10-CM | POA: Diagnosis not present

## 2022-10-20 DIAGNOSIS — C3490 Malignant neoplasm of unspecified part of unspecified bronchus or lung: Secondary | ICD-10-CM | POA: Diagnosis not present

## 2022-10-20 DIAGNOSIS — Z5111 Encounter for antineoplastic chemotherapy: Secondary | ICD-10-CM | POA: Diagnosis not present

## 2022-10-20 DIAGNOSIS — C7931 Secondary malignant neoplasm of brain: Secondary | ICD-10-CM | POA: Diagnosis not present

## 2022-10-20 DIAGNOSIS — Z95828 Presence of other vascular implants and grafts: Secondary | ICD-10-CM | POA: Insufficient documentation

## 2022-10-20 LAB — COMPREHENSIVE METABOLIC PANEL
ALT: 14 U/L (ref 0–44)
AST: 22 U/L (ref 15–41)
Albumin: 3.4 g/dL — ABNORMAL LOW (ref 3.5–5.0)
Alkaline Phosphatase: 73 U/L (ref 38–126)
Anion gap: 10 (ref 5–15)
BUN: 15 mg/dL (ref 8–23)
CO2: 23 mmol/L (ref 22–32)
Calcium: 8.7 mg/dL — ABNORMAL LOW (ref 8.9–10.3)
Chloride: 104 mmol/L (ref 98–111)
Creatinine, Ser: 1 mg/dL (ref 0.61–1.24)
GFR, Estimated: >60 mL/min (ref >60)
Glucose, Bld: 98 mg/dL (ref 70–99)
Potassium: 3.9 mmol/L (ref 3.5–5.1)
Sodium: 137 mmol/L (ref 135–145)
Total Bilirubin: 1 mg/dL (ref 0.3–1.2)
Total Protein: 6.9 g/dL (ref 6.5–8.1)

## 2022-10-20 LAB — CBC WITH DIFFERENTIAL/PLATELET
Abs Immature Granulocytes: 0.03 10*3/uL (ref 0.00–0.07)
Basophils Absolute: 0.1 10*3/uL (ref 0.0–0.1)
Basophils Relative: 1 %
Eosinophils Absolute: 0.2 10*3/uL (ref 0.0–0.5)
Eosinophils Relative: 3 %
HCT: 33.7 % — ABNORMAL LOW (ref 39.0–52.0)
Hemoglobin: 11.4 g/dL — ABNORMAL LOW (ref 13.0–17.0)
Immature Granulocytes: 1 %
Lymphocytes Relative: 14 %
Lymphs Abs: 0.8 10*3/uL (ref 0.7–4.0)
MCH: 33.1 pg (ref 26.0–34.0)
MCHC: 33.8 g/dL (ref 30.0–36.0)
MCV: 98 fL (ref 80.0–100.0)
Monocytes Absolute: 0.8 10*3/uL (ref 0.1–1.0)
Monocytes Relative: 14 %
Neutro Abs: 3.8 10*3/uL (ref 1.7–7.7)
Neutrophils Relative %: 67 %
Platelets: 274 10*3/uL (ref 150–400)
RBC: 3.44 MIL/uL — ABNORMAL LOW (ref 4.22–5.81)
RDW: 14.2 % (ref 11.5–15.5)
WBC: 5.7 10*3/uL (ref 4.0–10.5)
nRBC: 0 % (ref 0.0–0.2)

## 2022-10-20 LAB — TSH: TSH: 2.57 u[IU]/mL (ref 0.350–4.500)

## 2022-10-20 MED ORDER — CYANOCOBALAMIN 1000 MCG/ML IJ SOLN
1000.0000 ug | Freq: Once | INTRAMUSCULAR | Status: AC
  Administered 2022-10-20: 1000 ug via INTRAMUSCULAR
  Filled 2022-10-20: qty 1

## 2022-10-20 MED ORDER — HEPARIN SOD (PORK) LOCK FLUSH 100 UNIT/ML IV SOLN
500.0000 [IU] | Freq: Once | INTRAVENOUS | Status: DC | PRN
  Filled 2022-10-20: qty 5

## 2022-10-20 MED ORDER — ONDANSETRON HCL 4 MG/2ML IJ SOLN
8.0000 mg | Freq: Once | INTRAMUSCULAR | Status: AC
  Administered 2022-10-20: 8 mg via INTRAVENOUS
  Filled 2022-10-20: qty 4

## 2022-10-20 MED ORDER — SODIUM CHLORIDE 0.9 % IV SOLN
500.0000 mg/m2 | Freq: Once | INTRAVENOUS | Status: AC
  Administered 2022-10-20: 1100 mg via INTRAVENOUS
  Filled 2022-10-20: qty 4

## 2022-10-20 MED ORDER — SODIUM CHLORIDE 0.9 % IV SOLN
Freq: Once | INTRAVENOUS | Status: AC
  Filled 2022-10-20: qty 250

## 2022-10-20 MED ORDER — PALONOSETRON HCL INJECTION 0.25 MG/5ML
0.2500 mg | Freq: Once | INTRAVENOUS | Status: DC
Start: 2022-10-20 — End: 2022-10-20

## 2022-10-20 MED ORDER — SODIUM CHLORIDE 0.9 % IV SOLN
200.0000 mg | Freq: Once | INTRAVENOUS | Status: AC
  Administered 2022-10-20: 200 mg via INTRAVENOUS
  Filled 2022-10-20: qty 8

## 2022-10-20 MED ORDER — SODIUM CHLORIDE 0.9 % IV SOLN
10.0000 mg | Freq: Once | INTRAVENOUS | Status: DC
  Filled 2022-10-20: qty 1

## 2022-10-20 MED ORDER — SODIUM CHLORIDE 0.9% FLUSH
10.0000 mL | INTRAVENOUS | Status: DC | PRN
  Filled 2022-10-20: qty 10

## 2022-10-20 NOTE — Telephone Encounter (Signed)
Pt scheduled for Thurs 6/27 at 2:30p an arrive at 1:30p. Pt aware of appt

## 2022-10-20 NOTE — Progress Notes (Signed)
Hematology/Oncology Progress note [phone] Fax:([phone]) [phone]       CHIEF COMPLAINTS/PURPOSE OF CONSULTATION:  Metastatic non-small cell lung cancer, brain metastasis.   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 Nicola Girt . Marland Kitchen  CT scan done at [facility] showed good partial response.  He has been on maintenance Pemetrexed + Keytruda starting cycle 5.  I agree with [facility] to continuing current regimen given excellent tolerance and improvement in majority of disease.  Plan cycle 6 Pemetrexed + Keytruda today    Metastasis to brain [facility]) S/p  palliative radiation, MRI brain was done at [facility] showed treatment response.  Continue observation. He will have follow up MRI brain at [facility] for antineoplastic chemotherapy Treatment plan as discussed above.   Port-A-Cath in place sheath formation.  Patient will have appointment with IR for sheath stripping   Follow-up  3 weeks lab [facility] chemotherapy.  11/12/2022, per patient's preference. All questions were answered. The patient knows to call the clinic with any problems, questions or concerns.  Rickard Patience, MD, PhD [facility] Hematology Oncology 10/20/2022   HISTORY OF PRESENTING ILLNESS:  Kurt Ewing 67 y.o. male presents to establish care for metastatic lung cancer.  Patient is a lifelong never smoker.  Oncology History  Metastatic non-small cell lung cancer (HCC)  04/30/2022 Imaging   CT chest w contrast showed  1. Perihilar right upper lobe masslike opacity measuring 3.4 x 5.6 x 3.8 cm, This abuts the major  fissure 2. Moderate-sized right pleural effusion which is partially loculated tracks anteriorly the lower hemithorax. Possible occasional pleural enhancement and thickening posteriorly. 3. Rounded masslike opacity in the dependent right lower lobe measuring 5.8 x 4.1 cm 4. Additional bilateral pulmonary nodules, largest measuring 7 mm in the right upper lobe. 5. Prominent mediastinal and right hilar lymph nodes.  6   Low-density 2.4 cm right adrenal nodule. This is unchanged from 12/15/2019 abdominal CT in typical of adenoma   05/05/2022 Procedure   status post ultrasound-guided right thoracentesis.  Cytology showed suspicious for malignancy, background lymphocyte predominant effusion.    05/13/2022 Imaging   PET scan initial staging showed 1. 5.9 by 3.5 cm right hilar mass with maximum SUV 5.7, compatible with malignancy. The epicenter of this mass appears to be in the right upper lobe. Faintly increased activity in the right hilar lymph node which could be involved. 2. Large right pleural effusion with pleural thickening and subtle accentuated activity along the pleural surface without a well-defined masslike pleural lesion. Correlate with recent thoracocentesis results. 3. Low-grade activity in the collapsed right lower lobe, maximum SUV 3.2, probably inflammatory. 4. No findings of malignancy outside of the chest. 5. 2.4 cm right adrenal adenoma does not require follow up. 6. 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 Lumbar spondylosis and degenerative disc disease. 7. Aortic atherosclerosis.   05/21/2022 Imaging   MRI brain with and without contrast showed multiple peripherally enhancing lesions in the right middle cerebral hemispheres, concerning for metastatic disease.   05/29/2022 Initial Diagnosis  Metastatic non-small cell lung cancer (HCC)  02/25/22 patient was seen by PCP and was diagnosed with bronchitis, was treated with Azithromycin.  Cough did  not improve. He took levaquin for 10 days without improvement either.   05/29/2022 status post microscopy biopsy. Right upper lobe biopsy-non-small cell carcinoma, favor adenocarcinoma. Right lower lobe biopsy-nondiagnostic reactive bronchial cells Right upper lobe ENB assisted FNA, positive for malignancy.  Non-small cell carcinoma, favor adenocarcinoma. Right upper lobe lung ENB with depression-positive for malignancy, non-small cell carcinoma is present Station 10R -positive for malignancy consistent with metastatic non-small cell carcinoma involving lymphoid material. Station 11 R-FNA showed suspicious for malignancy.  Single atypical epithelial groups in a hypocellular specimen Station 7-FNA showed positive for malignancy.  Consistent with metastatic non-small cell carcinoma involving lymphoid material   05/29/2022 Cancer Staging   Staging form: Lung, AJCC 8th Edition - Clinical stage from 05/29/2022: Stage IV (cT3, cN2, cM1) - Signed by Rickard Patience, MD on 06/03/2022 Stage prefix: Initial diagnosis   06/22/2022 -  Chemotherapy   Cycle 1 LUNG Carboplatin + Pemetrexed + Keytruda D1 q21d Induction x 6 Cycles  Starting cycle 2, patient receives carboplatin and pemetrexed Cycle 3 and Cycle 4 patient elected to go to [facility] and received Carboplatin + Pemetrexed + Keytruda Cycle 5 Pemetrexed + Keytruda at [facility].  Cycle 6 pemetrexed + Keytruda      07/07/2022 Procedure   Second opinion at [facility].  Repeat biopsy of the right pleura.  Pathology is positive for metastatic adenocarcinoma.  Pleural fluid cytology was positive.  Patient had right pleural catheter placed.  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  Numerous (approximately 20) enhancing lesions with mixed interval change relative to 05/21/2022, as detailed above.  2.  No midline shift or hydrocephalus. No evidence of leptomeningeal disease.     09/29/2022 Imaging   CT chest abdomen pelvis w contrast  Interval decrease in size of dominant right upper lobe mass.  Unchanged right anterior peridiaphragmatic lymph node suspicious for nodal metastases  Stable small right pleural effusion. Nodular right pleural thickening consistent with pleural metastasis  Increase in size of some of the pulmonary metastases     INTERVAL HISTORY Kurt Ewing is a 67 y.o. male who has above history reviewed by me today presents for follow up visit for metastatic lung non small cell lung cancer.   Patient is here by himself.  He reports feeling well.  No new complaints.  He hikes 4 miles daily.   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    SURGICAL HISTORY: Past Surgical History:  Procedure Laterality Date   COLONOSCOPY     IR CV LINE INJECTION  10/16/2022   IR IMAGING GUIDED PORT INSERTION  06/19/2022   IR REMOVAL OF PLURAL CATH W/CUFF  07/24/2022   KNEE ARTHROSCOPY Right 2005   TONSILLECTOMY  1970   VIDEO BRONCHOSCOPY WITH ENDOBRONCHIAL ULTRASOUND N/A 05/29/2022   Procedure: VIDEO BRONCHOSCOPY WITH ENDOBRONCHIAL ULTRASOUND;  Surgeon: Vida Rigger, MD;  Location: [facility] ORS;  Service: Thoracic;  Laterality: N/A;    SOCIAL HISTORY: Social History   Socioeconomic History   Marital status: Married    Spouse name: Clydie Braun   Number of children: 3   Years of education:  Not on file   Highest education level: Not on file  Occupational History   Not on file  Tobacco Use   Smoking status: Never   Smokeless tobacco: Never  Vaping Use   Vaping Use: Never used  Substance and Sexual Activity   Alcohol use: Yes    Comment: weekly   Drug use: Not Currently   Sexual activity: Not on file  Other Topics Concern   Not on file  Social History Narrative   Lives at home with wife Clydie Braun; 3 kids & 10 grandchildren    Social  Determinants of Health   Financial Resource Strain: Not on file  Food Insecurity: No Food Insecurity (07/24/2022)   Hunger Vital Sign    Worried About Running Out of Food in the Last Year: Never true    Ran Out of Food in the Last Year: Never true  Transportation Needs: No Transportation Needs (07/24/2022)   PRAPARE - Administrator, Civil Service (Medical): No    Lack of Transportation (Non-Medical): No  Physical Activity: Not on file  Stress: Not on file  Social Connections: Not on file  Intimate Partner Violence: Not At Risk (07/24/2022)   Humiliation, Afraid, Rape, and Kick questionnaire    Fear of Current or Ex-Partner: No    Emotionally Abused: No    Physically Abused: No    Sexually Abused: No    FAMILY HISTORY: Family History  Problem Relation Age of Onset   Hypertension Father    Heart disease Father    Hypertension Brother    COPD Brother     ALLERGIES:  has No Known Allergies.  MEDICATIONS:  Current Outpatient Medications  Medication Sig Dispense Refill   acetaminophen (TYLENOL) 500 MG tablet Take 500 mg by mouth every 6 (six) hours as needed for mild pain.     famotidine (PEPCID) 40 MG tablet Take 1 tablet by mouth at bedtime.     folic acid (FOLVITE) 1 MG tablet Take 1 tablet (1 mg total) by mouth daily. Start 7 days before pemetrexed chemotherapy. Continue until 21 days after pemetrexed completed. 100 tablet 3   melatonin 3 MG TABS tablet Take by mouth.     chlorhexidine (HIBICLENS) 4 % external liquid Apply topically daily. Wash below neck (Patient not taking: Reported on 10/09/2022) 120 mL 0   lidocaine-prilocaine (EMLA) cream Apply 1 Application topically as needed. (Patient not taking: Reported on 10/09/2022) 30 g 2   ondansetron (ZOFRAN) 8 MG tablet Take 8 mg by mouth every 8 (eight) hours as needed for nausea or vomiting. (Patient not taking: Reported on 10/20/2022)     No current facility-administered medications for this visit.    Facility-Administered Medications Ordered in Other Visits  Medication Dose Route Frequency Provider Last Rate Last Admin   heparin lock flush 100 unit/mL  500 Units Intracatheter Once PRN Rickard Patience, MD       sodium chloride flush (NS) 0.9 % injection 10 mL  10 mL Intravenous PRN Rickard Patience, MD   10 mL at 08/10/22 5284    Review of Systems  Constitutional:  Negative for appetite change, chills, fatigue, fever and unexpected weight change.  HENT:   Negative for hearing loss and voice change.   Eyes:  Negative for eye problems and icterus.  Respiratory:  Negative for chest tightness, cough and shortness of breath.   Cardiovascular:  Negative for chest pain and leg swelling.  Gastrointestinal:  Negative for abdominal distention and abdominal pain.  Endocrine:  Negative for hot flashes.  Genitourinary:  Negative for difficulty urinating, dysuria and frequency.   Musculoskeletal:  Negative for arthralgias.  Skin:  Negative for itching and rash.  Neurological:  Negative for light-headedness and numbness.  Hematological:  Negative for adenopathy. Does not bruise/bleed easily.  Psychiatric/Behavioral:  Negative for confusion.      PHYSICAL EXAMINATION: ECOG PERFORMANCE STATUS: 0 - Asymptomatic  Vitals:   10/20/22 1034  BP: (!) 125/91  Pulse: 73  Resp: 18  Temp: (!) 96.7 F (35.9 C)  SpO2: 99%   Filed Weights   10/20/22 1034  Weight: 189 lb (85.7 kg)    Physical Exam Constitutional:      General: He is not in acute distress.    Appearance: He is not diaphoretic.  HENT:     Head: Normocephalic and atraumatic.  Eyes:     General: No scleral icterus. Cardiovascular:     Rate and Rhythm: Normal rate.     Heart sounds: No murmur heard. Pulmonary:     Effort: Pulmonary effort is normal. No respiratory distress.     Comments: Decreased right lower lung breath sound Abdominal:     General: Bowel sounds are normal. There is no distension.     Palpations: Abdomen is soft.   Musculoskeletal:        General: Normal range of motion.     Cervical back: Normal range of motion.  Skin:    General: Skin is warm and dry.     Findings: No erythema.  Neurological:     Mental Status: He is alert and oriented to person, place, and time. Mental status is at baseline.     Cranial Nerves: No cranial nerve deficit.     Motor: No abnormal muscle tone.  Psychiatric:        Mood and Affect: Mood and affect normal.      LABORATORY DATA:  I have reviewed the data as listed    Latest Ref Rng & Units 10/20/2022   10:16 AM 08/10/2022    9:12 AM 08/03/2022    9:07 AM  CBC  WBC 4.0 - 10.5 K/uL 5.7  6.9  4.3   Hemoglobin 13.0 - 17.0 g/dL 40.9  81.1  91.4   Hematocrit 39.0 - 52.0 % 33.7  33.8  34.4   Platelets 150 - 400 K/uL 274  219  176       Latest Ref Rng & Units 10/20/2022   10:16 AM 08/10/2022    9:12 AM 08/03/2022    9:07 AM  CMP  Glucose 70 - 99 mg/dL 98  782  956   BUN 8 - 23 mg/dL 15  14  20    Creatinine 0.61 - 1.24 mg/dL 2.13  0.86  5.78   Sodium 135 - 145 mmol/L 137  135  138   Potassium 3.5 - 5.1 mmol/L 3.9  4.0  3.7   Chloride 98 - 111 mmol/L 104  104  107   CO2 22 - 32 mmol/L 23  25  25    Calcium 8.9 - 10.3 mg/dL 8.7  8.5  8.6   Total Protein 6.5 - 8.1 g/dL 6.9  6.6  6.7   Total Bilirubin 0.3 - 1.2 mg/dL 1.0  1.0  1.1   Alkaline Phos 38 - 126 U/L 73  71  66   AST 15 - 41 U/L 22  22  22    ALT 0 - 44 U/L 14  18  24  RADIOGRAPHIC STUDIES: I have personally reviewed the radiological images as listed and agreed with the findings in the report. IR CV Line Injection  Result Date: 10/16/2022 INDICATION: Malfunctioning port EXAM: Port injection and evaluation using fluoroscopy MEDICATIONS: None ANESTHESIA/SEDATION: None FLUOROSCOPY TIME:  Fluoroscopy Time: 0.7 minutes (63 mGy) COMPLICATIONS: None immediate. PROCEDURE: Informed written consent was obtained from the patient after a thorough discussion of the procedural risks, benefits and alternatives. All  questions were addressed. Maximal Sterile Barrier Technique was utilized including caps, mask, sterile gowns, sterile gloves, sterile drape, hand hygiene and skin antiseptic. A timeout was performed prior to the initiation of the procedure. The right chest port was accessed using standard sterile technique. The port appears to enter via the right internal jugular vein. The port terminates in near the superior cavoatrial junction. The port was found to flush, but did not aspirate. Injection of the port with contrast material was performed, in the port studied under fluoroscopy with both standard and DSA runs. Images demonstrate normal course of the catheter without kink or discontinuity. There appears to be reflux of contrast material along the distal aspect of the catheter. IMPRESSION: Evaluation of the right chest port under fluoroscopy demonstrates likely fibrin sheath. The patient will be offered fibrin sheath stripping. Electronically Signed   By: Olive Bass M.D.   On: 10/16/2022 16:10

## 2022-10-20 NOTE — Assessment & Plan Note (Signed)
sheath formation.  Patient will have appointment with IR for sheath stripping

## 2022-10-20 NOTE — Assessment & Plan Note (Signed)
Treatment plan as discussed above.  

## 2022-10-20 NOTE — Assessment & Plan Note (Signed)
S/p  palliative radiation, MRI brain was done at [facility] showed treatment response.  Continue observation. He will have follow up MRI brain at [facility]

## 2022-10-20 NOTE — Patient Instructions (Signed)
[place] [facility]  Discharge Instructions: Thank you for choosing [facility] to provide your oncology and hematology care.  If you have a lab appointment with the Cancer Center, please go directly to the Cancer Center and check in at the registration area.  Wear comfortable clothing and clothing appropriate for easy access to any Portacath or PICC line.   We strive to give you quality time with your provider. You may need to reschedule your appointment if you arrive late (15 or more minutes).  Arriving late affects you and other patients whose appointments are after yours.  Also, if you miss three or more appointments without notifying the office, you may be dismissed from the clinic at the provider's discretion.      For prescription refill requests, have your pharmacy contact our office and allow 72 hours for refills to be completed.    Today you received the following chemotherapy and/or immunotherapy agents alimta, Rande Lawman      To help prevent nausea and vomiting after your treatment, we encourage you to take your nausea medication as directed.  BELOW ARE SYMPTOMS THAT SHOULD BE REPORTED IMMEDIATELY: *FEVER GREATER THAN 100.4 F (38 C) OR HIGHER *CHILLS OR SWEATING *NAUSEA AND VOMITING THAT IS NOT CONTROLLED WITH YOUR NAUSEA MEDICATION *UNUSUAL SHORTNESS OF BREATH *UNUSUAL BRUISING OR BLEEDING *URINARY PROBLEMS (pain or burning when urinating, or frequent urination) *BOWEL PROBLEMS (unusual diarrhea, constipation, pain near the anus) TENDERNESS IN MOUTH AND THROAT WITH OR WITHOUT PRESENCE OF ULCERS (sore throat, sores in mouth, or a toothache) UNUSUAL RASH, SWELLING OR PAIN  UNUSUAL VAGINAL DISCHARGE OR ITCHING   Items with * indicate a potential emergency and should be followed up as soon as possible or go to the Emergency Department if any problems should occur.  Please show the CHEMOTHERAPY ALERT CARD or IMMUNOTHERAPY ALERT CARD at  check-in to the Emergency Department and triage nurse.  Should you have questions after your visit or need to cancel or reschedule your appointment, please contact [place] [facility]  [phone] and follow the prompts.  Office hours are 8:00 a.m. to 4:30 p.m. Monday - Friday. Please note that voicemails left after 4:00 p.m. may not be returned until the following business day.  We are closed weekends and major holidays. You have access to a nurse at all times for urgent questions. Please call the main number to the clinic [phone] and follow the prompts.  For any non-urgent questions, you may also contact your provider using MyChart. We now offer e-Visits for anyone 60 and older to request care online for non-urgent symptoms. For details visit [email or web address].   Also download the MyChart app! Go to the app store, search "MyChart", open the app, select [facility], and log in with your MyChart username and password.

## 2022-10-20 NOTE — Assessment & Plan Note (Addendum)
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 Altan] . Marland Kitchen  CT scan done at [facility] showed good partial response.  He has been on maintenance Pemetrexed + Keytruda starting cycle 5.  I agree with [facility] to continuing current regimen given excellent tolerance and improvement in majority of disease.  Plan cycle 6 Pemetrexed + Keytruda today

## 2022-10-20 NOTE — Telephone Encounter (Signed)
Request for fibrin sheath stripping faxed to IR.

## 2022-10-21 ENCOUNTER — Other Ambulatory Visit: Payer: Self-pay

## 2022-10-21 ENCOUNTER — Other Ambulatory Visit: Payer: Self-pay | Admitting: Internal Medicine

## 2022-10-21 ENCOUNTER — Telehealth: Payer: Self-pay

## 2022-10-21 DIAGNOSIS — Z95828 Presence of other vascular implants and grafts: Secondary | ICD-10-CM

## 2022-10-21 LAB — T4: T4, Total: 6.7 ug/dL (ref 4.5–12.0)

## 2022-10-21 NOTE — Telephone Encounter (Signed)
Contacted Clydie Braun to see if they wanted to keep appt for cathflo. She states that they will keep appt. Questions regarding process answered.

## 2022-10-21 NOTE — Telephone Encounter (Addendum)
Received message from Marylu Lund stating that she spoke to pt's wife yesterday and "she said that the pt wasn't wanting to use his port and she didnt think that he wanted to proceed with procedure tomorrow."   Called and spoke to pt's wife for clarification. She states that pt is fine to just use peripheral line as he did not want to do an invasive procedure. Spoke to MD and we can set him up for Cathflo to see if that works. pt and wife agreeable to try this.   Fibrin sheath stripping has been cancelled  Please contact pt to schedule cathflo, any time priro to tx on 7/18.

## 2022-10-22 ENCOUNTER — Ambulatory Visit
Admission: RE | Admit: 2022-10-22 | Discharge: 2022-10-22 | Disposition: A | Discharge status: Home / Self Care | Payer: Medicare Other | Source: Ambulatory Visit | Attending: Oncology | Admitting: Oncology

## 2022-10-23 ENCOUNTER — Inpatient Hospital Stay: Payer: Medicare Other

## 2022-10-23 ENCOUNTER — Other Ambulatory Visit: Payer: Self-pay | Admitting: Emergency Medicine

## 2022-10-23 VITALS — BP 121/86 | HR 80 | Temp 97.2°F | Resp 18

## 2022-10-23 DIAGNOSIS — Z95828 Presence of other vascular implants and grafts: Secondary | ICD-10-CM

## 2022-10-23 DIAGNOSIS — Z5112 Encounter for antineoplastic immunotherapy: Secondary | ICD-10-CM | POA: Diagnosis not present

## 2022-10-23 MED ORDER — ALTEPLASE 2 MG IJ SOLR
2.0000 mg | Freq: Once | INTRAMUSCULAR | Status: AC
  Administered 2022-10-23: 2 mg
  Filled 2022-10-23: qty 2

## 2022-10-23 NOTE — Progress Notes (Signed)
Explained process to patient and 2mg  Cathflo given at [id number]. No blood return noted after 30 minutes.   1028: rechecked. No blood return noted. Dr. Cathie Hoops made aware.Second Dose of Cathflo ordered.  1038: 2mg  cathflo administered.  1108: No blood return noted.  1238: Blood return noted. Dr. Cathie Hoops aware. Port flushed with heparin and deaccessed.

## 2022-11-05 ENCOUNTER — Telehealth: Payer: Self-pay | Admitting: Oncology

## 2022-11-05 NOTE — Telephone Encounter (Signed)
Per Secure chat Dr.Yu stated "please reach out to him to clarify if he is planning to do his chemo on 7/18 or to do chemo at [facility]. let me know. thanks. "  I called pt and left vm to have him call us back regarding his upcoming appt

## 2022-11-10 ENCOUNTER — Encounter: Payer: Self-pay | Admitting: Oncology

## 2022-11-10 ENCOUNTER — Telehealth: Payer: Self-pay | Admitting: Oncology

## 2022-11-10 ENCOUNTER — Other Ambulatory Visit: Payer: Self-pay | Admitting: Oncology

## 2022-11-10 NOTE — Telephone Encounter (Signed)
Called pt wife regarding chemo appts on 718, pt wife stated they will NOT be here at the cancer center for the appts and will be doing the chemo at [facility].  Pt wife stated she wanted to keep the appts in August until [facility] told her otherwise

## 2022-11-12 ENCOUNTER — Inpatient Hospital Stay: Payer: Medicare Other | Admitting: Oncology

## 2022-11-12 ENCOUNTER — Inpatient Hospital Stay: Payer: Medicare Other

## 2022-11-25 ENCOUNTER — Other Ambulatory Visit: Payer: Self-pay | Admitting: Oncology

## 2022-11-30 ENCOUNTER — Encounter: Payer: Self-pay | Admitting: Oncology

## 2022-12-03 ENCOUNTER — Encounter: Payer: Self-pay | Admitting: Oncology

## 2022-12-03 ENCOUNTER — Inpatient Hospital Stay: Payer: Medicare Other

## 2022-12-03 ENCOUNTER — Inpatient Hospital Stay: Payer: Medicare Other | Attending: Oncology

## 2022-12-03 ENCOUNTER — Inpatient Hospital Stay (HOSPITAL_BASED_OUTPATIENT_CLINIC_OR_DEPARTMENT_OTHER): Payer: Medicare Other | Admitting: Oncology

## 2022-12-03 VITALS — BP 134/68 | HR 68 | Temp 96.0°F | Resp 18 | Wt 189.2 lb

## 2022-12-03 DIAGNOSIS — C3411 Malignant neoplasm of upper lobe, right bronchus or lung: Secondary | ICD-10-CM | POA: Insufficient documentation

## 2022-12-03 DIAGNOSIS — L299 Pruritus, unspecified: Secondary | ICD-10-CM | POA: Insufficient documentation

## 2022-12-03 DIAGNOSIS — Z5112 Encounter for antineoplastic immunotherapy: Secondary | ICD-10-CM | POA: Diagnosis not present

## 2022-12-03 DIAGNOSIS — C7931 Secondary malignant neoplasm of brain: Secondary | ICD-10-CM

## 2022-12-03 DIAGNOSIS — Z5111 Encounter for antineoplastic chemotherapy: Secondary | ICD-10-CM

## 2022-12-03 DIAGNOSIS — Z79620 Long term (current) use of immunosuppressive biologic: Secondary | ICD-10-CM | POA: Insufficient documentation

## 2022-12-03 DIAGNOSIS — C3490 Malignant neoplasm of unspecified part of unspecified bronchus or lung: Secondary | ICD-10-CM

## 2022-12-03 DIAGNOSIS — H5789 Other specified disorders of eye and adnexa: Secondary | ICD-10-CM

## 2022-12-03 LAB — CBC WITH DIFFERENTIAL/PLATELET
Abs Immature Granulocytes: 0.05 10*3/uL (ref 0.00–0.07)
Basophils Absolute: 0.1 10*3/uL (ref 0.0–0.1)
Basophils Relative: 1 %
Eosinophils Absolute: 0.2 10*3/uL (ref 0.0–0.5)
Eosinophils Relative: 2 %
HCT: 35 % — ABNORMAL LOW (ref 39.0–52.0)
Hemoglobin: 11.8 g/dL — ABNORMAL LOW (ref 13.0–17.0)
Immature Granulocytes: 1 %
Lymphocytes Relative: 11 %
Lymphs Abs: 0.8 10*3/uL (ref 0.7–4.0)
MCH: 32.8 pg (ref 26.0–34.0)
MCHC: 33.7 g/dL (ref 30.0–36.0)
MCV: 97.2 fL (ref 80.0–100.0)
Monocytes Absolute: 1 10*3/uL (ref 0.1–1.0)
Monocytes Relative: 14 %
Neutro Abs: 4.9 10*3/uL (ref 1.7–7.7)
Neutrophils Relative %: 71 %
Platelets: 273 10*3/uL (ref 150–400)
RBC: 3.6 MIL/uL — ABNORMAL LOW (ref 4.22–5.81)
RDW: 13.7 % (ref 11.5–15.5)
WBC: 7 10*3/uL (ref 4.0–10.5)
nRBC: 0 % (ref 0.0–0.2)

## 2022-12-03 LAB — COMPREHENSIVE METABOLIC PANEL
ALT: 24 U/L (ref 0–44)
AST: 26 U/L (ref 15–41)
Albumin: 3.4 g/dL — ABNORMAL LOW (ref 3.5–5.0)
Alkaline Phosphatase: 81 U/L (ref 38–126)
Anion gap: 6 (ref 5–15)
BUN: 18 mg/dL (ref 8–23)
CO2: 25 mmol/L (ref 22–32)
Calcium: 8.5 mg/dL — ABNORMAL LOW (ref 8.9–10.3)
Chloride: 106 mmol/L (ref 98–111)
Creatinine, Ser: 1.07 mg/dL (ref 0.61–1.24)
GFR, Estimated: >60 mL/min (ref >60)
Glucose, Bld: 114 mg/dL — ABNORMAL HIGH (ref 70–99)
Potassium: 4 mmol/L (ref 3.5–5.1)
Sodium: 137 mmol/L (ref 135–145)
Total Bilirubin: 1.1 mg/dL (ref 0.3–1.2)
Total Protein: 6.8 g/dL (ref 6.5–8.1)

## 2022-12-03 MED ORDER — SODIUM CHLORIDE 0.9 % IV SOLN
200.0000 mg | Freq: Once | INTRAVENOUS | Status: AC
  Administered 2022-12-03: 200 mg via INTRAVENOUS
  Filled 2022-12-03: qty 8

## 2022-12-03 MED ORDER — HEPARIN SOD (PORK) LOCK FLUSH 100 UNIT/ML IV SOLN
500.0000 [IU] | Freq: Once | INTRAVENOUS | Status: AC | PRN
  Administered 2022-12-03: 500 [IU]
  Filled 2022-12-03: qty 5

## 2022-12-03 MED ORDER — SODIUM CHLORIDE 0.9 % IV SOLN
500.0000 mg/m2 | Freq: Once | INTRAVENOUS | Status: AC
  Administered 2022-12-03: 1100 mg via INTRAVENOUS
  Filled 2022-12-03: qty 40

## 2022-12-03 MED ORDER — SODIUM CHLORIDE 0.9 % IV SOLN
Freq: Once | INTRAVENOUS | Status: AC
  Filled 2022-12-03: qty 250

## 2022-12-03 MED ORDER — CYANOCOBALAMIN 1000 MCG/ML IJ SOLN
1000.0000 ug | Freq: Once | INTRAMUSCULAR | Status: DC
Start: 2022-12-03 — End: 2022-12-03

## 2022-12-03 MED ORDER — SODIUM CHLORIDE 0.9 % IV SOLN
10.0000 mg | Freq: Once | INTRAVENOUS | Status: AC
  Administered 2022-12-03: 10 mg via INTRAVENOUS
  Filled 2022-12-03: qty 10

## 2022-12-03 MED ORDER — ONDANSETRON HCL 4 MG/2ML IJ SOLN
8.0000 mg | Freq: Once | INTRAMUSCULAR | Status: AC
  Administered 2022-12-03: 8 mg via INTRAVENOUS
  Filled 2022-12-03: qty 4

## 2022-12-03 NOTE — Assessment & Plan Note (Signed)
Recommend skin moisturizer.  If persistent symptom, consider Atarax.

## 2022-12-03 NOTE — Assessment & Plan Note (Addendum)
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 Altan] . Marland Kitchen  CT scan done at [facility] showed good partial response.  He has been on maintenance Pemetrexed + Keytruda starting cycle 5.  He gets treatment alternatively with [facility] and our cancer center.  I agree with [facility] to continuing current regimen given excellent tolerance and improvement in majority of disease.  Plan cycle 8 Pemetrexed + Keytruda today  He will get cycle 9 with [facility] and return for cycle 10 treatment.

## 2022-12-03 NOTE — Assessment & Plan Note (Signed)
Treatment plan as discussed above.  

## 2022-12-03 NOTE — Assessment & Plan Note (Signed)
S/p  palliative radiation, MRI brain was done at [facility] showed treatment response.  Continue observation. He gets surveillance MRI brain at [facility]

## 2022-12-03 NOTE — Progress Notes (Signed)
Hematology/Oncology Progress note [phone] [phone]       CHIEF COMPLAINTS/PURPOSE OF CONSULTATION:  Metastatic non-small cell lung cancer, brain metastasis.   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 Nicola Girt . Marland Kitchen  CT scan done at [facility] showed good partial response.  He has been on maintenance Pemetrexed + Keytruda starting cycle 5.  He gets treatment alternatively with [facility] and [facility] cancer center.  I agree with [facility] to continuing current regimen given excellent tolerance and improvement in majority of disease.  Plan cycle 8 Pemetrexed + Keytruda today  He will get cycle 9 with [facility] and return for cycle 10 treatment.   Metastasis to brain [facility]) S/p  palliative radiation, MRI brain was done at [facility] showed treatment response.  Continue observation. He gets surveillance MRI brain at [facility] for antineoplastic chemotherapy Treatment plan as discussed above.   Itchy skin Recommend skin moisturizer.  If persistent symptom, consider Atarax.   Discharge from eye Recommend trials of Claritin If persistent symptoms, recommend ophthalmology eval.     Follow-up  6 weeks lab [facility] chemotherapy.  All questions were answered. The patient knows to call the clinic with any problems, questions or concerns.  Rickard Patience, MD, PhD [facility] Hematology Oncology 12/03/2022   HISTORY OF PRESENTING ILLNESS:  Kurt Ewing 67 y.o. male presents to establish care for metastatic lung cancer.  Patient is a lifelong never smoker.  Oncology History   Metastatic non-small cell lung cancer (HCC)  04/30/2022 Imaging   CT chest w contrast showed  1. Perihilar right upper lobe masslike opacity measuring 3.4 x 5.6 x 3.8 cm, This abuts the major fissure 2. Moderate-sized right pleural effusion which is partially loculated tracks anteriorly the lower hemithorax. Possible occasional pleural enhancement and thickening posteriorly. 3. Rounded masslike opacity in the dependent right lower lobe measuring 5.8 x 4.1 cm 4. Additional bilateral pulmonary nodules, largest measuring 7 mm in the right upper lobe. 5. Prominent mediastinal and right hilar lymph nodes.  6   Low-density 2.4 cm right adrenal nodule. This is unchanged from 12/15/2019 abdominal CT in typical of adenoma   05/05/2022 Procedure   status post ultrasound-guided right thoracentesis.  Cytology showed suspicious for malignancy, background lymphocyte predominant effusion.    05/13/2022 Imaging   PET scan initial staging showed 1. 5.9 by 3.5 cm right hilar mass with maximum SUV 5.7, compatible with malignancy. The epicenter of this mass appears to be in the right upper lobe. Faintly increased activity in the right hilar lymph node which could be involved. 2. Large right pleural effusion with pleural thickening and subtle accentuated activity along the pleural surface without a well-defined masslike pleural lesion. Correlate with recent thoracocentesis results. 3. Low-grade activity in the collapsed right lower lobe, maximum SUV 3.2, probably inflammatory. 4. No findings of malignancy outside of the chest. 5. 2.4 cm right adrenal adenoma does not require follow up. 6. 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 Lumbar spondylosis and degenerative disc disease. 7. Aortic atherosclerosis.   05/21/2022  Imaging   MRI brain with and without contrast showed multiple peripherally enhancing lesions in the right middle cerebral hemispheres, concerning for metastatic  disease.   05/29/2022 Initial Diagnosis   Metastatic non-small cell lung cancer (HCC)  02/25/22 patient was seen by PCP and was diagnosed with bronchitis, was treated with Azithromycin.  Cough did not improve. He took levaquin for 10 days without improvement either.   05/29/2022 status post microscopy biopsy. Right upper lobe biopsy-non-small cell carcinoma, favor adenocarcinoma. Right lower lobe biopsy-nondiagnostic reactive bronchial cells Right upper lobe ENB assisted FNA, positive for malignancy.  Non-small cell carcinoma, favor adenocarcinoma. Right upper lobe lung ENB with depression-positive for malignancy, non-small cell carcinoma is present Station 10R -positive for malignancy consistent with metastatic non-small cell carcinoma involving lymphoid material. Station 11 R-FNA showed suspicious for malignancy.  Single atypical epithelial groups in a hypocellular specimen Station 7-FNA showed positive for malignancy.  Consistent with metastatic non-small cell carcinoma involving lymphoid material   05/29/2022 Cancer Staging   Staging form: Lung, AJCC 8th Edition - Clinical stage from 05/29/2022: Stage IV (cT3, cN2, cM1) - Signed by Rickard Patience, MD on 06/03/2022 Stage prefix: Initial diagnosis   06/22/2022 -  Chemotherapy   Cycle 1 LUNG Carboplatin + Pemetrexed + Keytruda D1 q21d Induction x 6 Cycles  Starting cycle 2, patient receives carboplatin and pemetrexed Cycle 3 and Cycle 4 patient elected to go to [facility] and received Carboplatin + Pemetrexed + Keytruda Cycle 5 Pemetrexed + Keytruda at [facility].  Cycle 6 pemetrexed + Keytruda      07/07/2022 Procedure   Second opinion at [facility].  Repeat biopsy of the right pleura.  Pathology is positive for metastatic adenocarcinoma.  Pleural fluid cytology was positive.  Patient had right pleural catheter placed.  NGS at [facility] showed ERBB2 duplication, [name], NOTCH4, TP53   09/28/2022 Imaging   MRI brain w wo contrast 1.   Numerous (approximately 20) enhancing lesions with mixed interval change relative to 05/21/2022, as detailed above.  2.  No midline shift or hydrocephalus. No evidence of leptomeningeal disease.    09/29/2022 Imaging   CT chest abdomen pelvis w contrast  Interval decrease in size of dominant right upper lobe mass.  Unchanged right anterior peridiaphragmatic lymph node suspicious for nodal metastases  Stable small right pleural effusion. Nodular right pleural thickening consistent with pleural metastasis  Increase in size of some of the pulmonary metastases     INTERVAL HISTORY Kurt Ewing is a 68 y.o. male who has above history reviewed by me today presents for follow up visit for metastatic lung non small cell lung cancer.   Patient is here by himself.  He reports feeling well.  No new complaints.  + increased eye discharge bilaterally.  + skin itchy.   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    SURGICAL HISTORY: Past Surgical History:  Procedure Laterality Date   COLONOSCOPY     IR CV LINE INJECTION  10/16/2022   IR IMAGING GUIDED PORT INSERTION  06/19/2022   IR REMOVAL OF PLURAL CATH W/CUFF  07/24/2022   KNEE ARTHROSCOPY Right 2005   TONSILLECTOMY  1970   VIDEO BRONCHOSCOPY WITH ENDOBRONCHIAL ULTRASOUND N/A 05/29/2022   Procedure: VIDEO BRONCHOSCOPY WITH ENDOBRONCHIAL ULTRASOUND;  Surgeon: Vida Rigger, MD;  Location: [facility] ORS;  Service: Thoracic;  Laterality: N/A;    SOCIAL HISTORY: Social History   Socioeconomic History   Marital status: Married    Spouse name: Clydie Braun   Number of children: 3   Years of education: Not on file   Highest education level: Not on file  Occupational History   Not on file  Tobacco Use   Smoking status: Never   Smokeless tobacco: Never  Vaping Use   Vaping status: Never Used  Substance and Sexual Activity    Alcohol use: Yes    Comment: weekly   Drug use: Not Currently   Sexual activity: Not on file  Other Topics Concern   Not on file  Social History Narrative   Lives at home with wife Clydie Braun; 3 kids & 10 grandchildren    Social Determinants of Health   Financial Resource Strain: Not on file  Food Insecurity: No Food Insecurity (07/24/2022)   Hunger Vital Sign    Worried About Running Out of Food in the Last Year: Never true    Ran Out of Food in the Last Year: Never true  Transportation Needs: No Transportation Needs (07/24/2022)   PRAPARE - Administrator, Civil Service (Medical): No    Lack of Transportation (Non-Medical): No  Physical Activity: Not on file  Stress: Not on file  Social Connections: Not on file  Intimate Partner Violence: Not At Risk (07/24/2022)   Humiliation, Afraid, Rape, and Kick questionnaire    Fear of Current or Ex-Partner: No    Emotionally Abused: No    Physically Abused: No    Sexually Abused: No    FAMILY HISTORY: Family History  Problem Relation Age of Onset   Hypertension Father    Heart disease Father    Hypertension Brother    COPD Brother     ALLERGIES:  has No Known Allergies.  MEDICATIONS:  Current Outpatient Medications  Medication Sig Dispense Refill   acetaminophen (TYLENOL) 500 MG tablet Take 500 mg by mouth every 6 (six) hours as needed for mild pain.     famotidine (PEPCID) 40 MG tablet Take 1 tablet by mouth at bedtime.     folic acid (FOLVITE) 1 MG tablet Take 1 tablet (1 mg total) by mouth daily. Start 7 days before pemetrexed chemotherapy. Continue until 21 days after pemetrexed completed. 100 tablet 3   melatonin 3 MG TABS tablet Take by mouth.     ondansetron (ZOFRAN) 8 MG tablet Take 8 mg by mouth every 8 (eight) hours as needed for nausea or vomiting.     chlorhexidine (HIBICLENS) 4 % external liquid Apply topically daily. Wash below neck (Patient not taking: Reported on 10/09/2022) 120 mL 0   lidocaine-prilocaine  (EMLA) cream Apply 1 Application topically as needed. (Patient not taking: Reported on 10/09/2022) 30 g 2   No current facility-administered medications for this visit.   Facility-Administered Medications Ordered in Other Visits  Medication Dose Route Frequency Provider Last Rate Last Admin   heparin lock flush 100 unit/mL  500 Units Intracatheter Once PRN Rickard Patience, MD       sodium chloride flush (NS) 0.9 % injection 10 mL  10 mL Intravenous PRN Rickard Patience, MD   10 mL at 08/10/22 6578    Review of Systems  Constitutional:  Negative for appetite change, chills, fatigue, fever and unexpected weight change.  HENT:   Positive for hearing loss. Negative for voice change.        Tinnitus   Eyes:  Negative for eye  problems and icterus.  Respiratory:  Negative for chest tightness, cough and shortness of breath.   Cardiovascular:  Negative for chest pain and leg swelling.  Gastrointestinal:  Negative for abdominal distention and abdominal pain.  Endocrine: Negative for hot flashes.  Genitourinary:  Negative for difficulty urinating, dysuria and frequency.   Musculoskeletal:  Negative for arthralgias.  Skin:  Positive for itching. Negative for rash.  Neurological:  Negative for light-headedness and numbness.  Hematological:  Negative for adenopathy. Does not bruise/bleed easily.  Psychiatric/Behavioral:  Negative for confusion.      PHYSICAL EXAMINATION: ECOG PERFORMANCE STATUS: 0 - Asymptomatic  Vitals:   12/03/22 1029  BP: 134/68  Pulse: 68  Resp: 18  Temp: (!) 96 F (35.6 C)  SpO2: 99%   Filed Weights   12/03/22 1029  Weight: 189 lb 3.2 oz (85.8 kg)    Physical Exam Constitutional:      General: He is not in acute distress.    Appearance: He is not diaphoretic.  HENT:     Head: Normocephalic and atraumatic.  Eyes:     General: No scleral icterus. Cardiovascular:     Rate and Rhythm: Normal rate.     Heart sounds: No murmur heard. Pulmonary:     Effort: Pulmonary effort  is normal. No respiratory distress.     Comments: Decreased right lower lung breath sound Abdominal:     General: Bowel sounds are normal. There is no distension.     Palpations: Abdomen is soft.  Musculoskeletal:        General: Normal range of motion.     Cervical back: Normal range of motion.  Skin:    General: Skin is warm and dry.     Findings: No erythema.  Neurological:     Mental Status: He is alert and oriented to person, place, and time. Mental status is at baseline.     Cranial Nerves: No cranial nerve deficit.     Motor: No abnormal muscle tone.  Psychiatric:        Mood and Affect: Mood and affect normal.      LABORATORY DATA:  I have reviewed the data as listed    Latest Ref Rng & Units 12/03/2022   10:11 AM 10/20/2022   10:16 AM 08/10/2022    9:12 AM  CBC  WBC 4.0 - 10.5 K/uL 7.0  5.7  6.9   Hemoglobin 13.0 - 17.0 g/dL 38.1  82.9  93.7   Hematocrit 39.0 - 52.0 % 35.0  33.7  33.8   Platelets 150 - 400 K/uL 273  274  219       Latest Ref Rng & Units 12/03/2022   10:11 AM 10/20/2022   10:16 AM 08/10/2022    9:12 AM  CMP  Glucose 70 - 99 mg/dL 169  98  678   BUN 8 - 23 mg/dL 18  15  14    Creatinine 0.61 - 1.24 mg/dL 9.38  1.01  7.51   Sodium 135 - 145 mmol/L 137  137  135   Potassium 3.5 - 5.1 mmol/L 4.0  3.9  4.0   Chloride 98 - 111 mmol/L 106  104  104   CO2 22 - 32 mmol/L 25  23  25    Calcium 8.9 - 10.3 mg/dL 8.5  8.7  8.5   Total Protein 6.5 - 8.1 g/dL 6.8  6.9  6.6   Total Bilirubin 0.3 - 1.2 mg/dL 1.1  1.0  1.0   Alkaline Phos  38 - 126 U/L 81  73  71   AST 15 - 41 U/L 26  22  22    ALT 0 - 44 U/L 24  14  18       RADIOGRAPHIC STUDIES: I have personally reviewed the radiological images as listed and agreed with the findings in the report. No results found.

## 2022-12-03 NOTE — Assessment & Plan Note (Signed)
Recommend trials of Claritin If persistent symptoms, recommend ophthalmology eval.

## 2022-12-04 ENCOUNTER — Other Ambulatory Visit: Payer: Self-pay

## 2022-12-23 ENCOUNTER — Other Ambulatory Visit: Payer: Self-pay | Admitting: Oncology

## 2022-12-23 ENCOUNTER — Encounter: Payer: Self-pay | Admitting: *Deleted

## 2022-12-23 DIAGNOSIS — C3490 Malignant neoplasm of unspecified part of unspecified bronchus or lung: Secondary | ICD-10-CM

## 2023-01-09 ENCOUNTER — Other Ambulatory Visit: Payer: Self-pay

## 2023-01-13 MED FILL — Dexamethasone Sodium Phosphate Inj 100 MG/10ML: INTRAMUSCULAR | Qty: 1 | Status: AC

## 2023-01-14 ENCOUNTER — Inpatient Hospital Stay (HOSPITAL_BASED_OUTPATIENT_CLINIC_OR_DEPARTMENT_OTHER): Payer: Medicare Other | Admitting: Oncology

## 2023-01-14 ENCOUNTER — Inpatient Hospital Stay: Payer: Medicare Other

## 2023-01-14 ENCOUNTER — Other Ambulatory Visit: Payer: Self-pay | Admitting: Oncology

## 2023-01-14 ENCOUNTER — Inpatient Hospital Stay: Payer: Medicare Other | Attending: Oncology

## 2023-01-14 ENCOUNTER — Encounter: Payer: Self-pay | Admitting: Oncology

## 2023-01-14 VITALS — BP 141/89 | HR 66 | Temp 97.6°F | Resp 18 | Wt 185.8 lb

## 2023-01-14 DIAGNOSIS — C3490 Malignant neoplasm of unspecified part of unspecified bronchus or lung: Secondary | ICD-10-CM

## 2023-01-14 DIAGNOSIS — Z5111 Encounter for antineoplastic chemotherapy: Secondary | ICD-10-CM | POA: Insufficient documentation

## 2023-01-14 DIAGNOSIS — Z79620 Long term (current) use of immunosuppressive biologic: Secondary | ICD-10-CM | POA: Insufficient documentation

## 2023-01-14 DIAGNOSIS — C3411 Malignant neoplasm of upper lobe, right bronchus or lung: Secondary | ICD-10-CM | POA: Diagnosis not present

## 2023-01-14 DIAGNOSIS — C7931 Secondary malignant neoplasm of brain: Secondary | ICD-10-CM | POA: Diagnosis not present

## 2023-01-14 DIAGNOSIS — Z5112 Encounter for antineoplastic immunotherapy: Secondary | ICD-10-CM | POA: Diagnosis present

## 2023-01-14 DIAGNOSIS — C782 Secondary malignant neoplasm of pleura: Secondary | ICD-10-CM | POA: Insufficient documentation

## 2023-01-14 LAB — CBC WITH DIFFERENTIAL/PLATELET
Abs Immature Granulocytes: 0.13 10*3/uL — ABNORMAL HIGH (ref 0.00–0.07)
Basophils Absolute: 0.1 10*3/uL (ref 0.0–0.1)
Basophils Relative: 1 %
Eosinophils Absolute: 0.1 10*3/uL (ref 0.0–0.5)
Eosinophils Relative: 1 %
HCT: 35.6 % — ABNORMAL LOW (ref 39.0–52.0)
Hemoglobin: 11.8 g/dL — ABNORMAL LOW (ref 13.0–17.0)
Immature Granulocytes: 2 %
Lymphocytes Relative: 8 %
Lymphs Abs: 0.6 10*3/uL — ABNORMAL LOW (ref 0.7–4.0)
MCH: 32.7 pg (ref 26.0–34.0)
MCHC: 33.1 g/dL (ref 30.0–36.0)
MCV: 98.6 fL (ref 80.0–100.0)
Monocytes Absolute: 0.5 10*3/uL (ref 0.1–1.0)
Monocytes Relative: 6 %
Neutro Abs: 7.1 10*3/uL (ref 1.7–7.7)
Neutrophils Relative %: 82 %
Platelets: 282 10*3/uL (ref 150–400)
RBC: 3.61 MIL/uL — ABNORMAL LOW (ref 4.22–5.81)
RDW: 15.1 % (ref 11.5–15.5)
WBC: 8.6 10*3/uL (ref 4.0–10.5)
nRBC: 0 % (ref 0.0–0.2)

## 2023-01-14 LAB — COMPREHENSIVE METABOLIC PANEL
ALT: 34 U/L (ref 0–44)
AST: 26 U/L (ref 15–41)
Albumin: 3.4 g/dL — ABNORMAL LOW (ref 3.5–5.0)
Alkaline Phosphatase: 91 U/L (ref 38–126)
Anion gap: 11 (ref 5–15)
BUN: 23 mg/dL (ref 8–23)
CO2: 23 mmol/L (ref 22–32)
Calcium: 8.6 mg/dL — ABNORMAL LOW (ref 8.9–10.3)
Chloride: 102 mmol/L (ref 98–111)
Creatinine, Ser: 1.06 mg/dL (ref 0.61–1.24)
GFR, Estimated: >60 mL/min (ref >60)
Glucose, Bld: 147 mg/dL — ABNORMAL HIGH (ref 70–99)
Potassium: 4.1 mmol/L (ref 3.5–5.1)
Sodium: 136 mmol/L (ref 135–145)
Total Bilirubin: 0.8 mg/dL (ref 0.3–1.2)
Total Protein: 7.1 g/dL (ref 6.5–8.1)

## 2023-01-14 LAB — TSH: TSH: 2.073 u[IU]/mL (ref 0.350–4.500)

## 2023-01-14 MED ORDER — SODIUM CHLORIDE 0.9 % IV SOLN
200.0000 mg | Freq: Once | INTRAVENOUS | Status: AC
  Administered 2023-01-14: 200 mg via INTRAVENOUS
  Filled 2023-01-14: qty 8

## 2023-01-14 MED ORDER — SODIUM CHLORIDE 0.9 % IV SOLN
Freq: Once | INTRAVENOUS | Status: AC
  Filled 2023-01-14: qty 250

## 2023-01-14 MED ORDER — SODIUM CHLORIDE 0.9 % IV SOLN
10.0000 mg | Freq: Once | INTRAVENOUS | Status: AC
  Administered 2023-01-14: 10 mg via INTRAVENOUS
  Filled 2023-01-14: qty 10

## 2023-01-14 MED ORDER — ONDANSETRON HCL 4 MG/2ML IJ SOLN
8.0000 mg | Freq: Once | INTRAMUSCULAR | Status: AC
  Administered 2023-01-14: 8 mg via INTRAVENOUS
  Filled 2023-01-14: qty 4

## 2023-01-14 MED ORDER — SODIUM CHLORIDE 0.9 % IV SOLN
500.0000 mg/m2 | Freq: Once | INTRAVENOUS | Status: AC
  Administered 2023-01-14: 1100 mg via INTRAVENOUS
  Filled 2023-01-14: qty 40

## 2023-01-14 MED ORDER — CYANOCOBALAMIN 1000 MCG/ML IJ SOLN
1000.0000 ug | Freq: Once | INTRAMUSCULAR | Status: AC
  Administered 2023-01-14: 1000 ug via INTRAMUSCULAR
  Filled 2023-01-14: qty 1

## 2023-01-14 MED ORDER — HEPARIN SOD (PORK) LOCK FLUSH 100 UNIT/ML IV SOLN
500.0000 [IU] | Freq: Once | INTRAVENOUS | Status: AC | PRN
  Administered 2023-01-14: 500 [IU]
  Filled 2023-01-14: qty 5

## 2023-01-14 NOTE — Assessment & Plan Note (Addendum)
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 Altan] . Marland Kitchen  He has been on maintenance Pemetrexed + Keytruda starting cycle 5.  He gets treatment alternatively with [facility] and [facility] cancer [facility].  I agree with [facility] to continuing current regimen given excellent tolerance and improvement in majority of disease.  CT scan/ MRI brain  done at [facility] showed stable disease. Proceed with cycle 10 Pemetrexed + Keytruda today  B12 injection today

## 2023-01-14 NOTE — Assessment & Plan Note (Signed)
S/p  palliative radiation, MRI brain was done at [facility] showed treatment response.  Continue observation. He gets surveillance MRI brain at [facility]

## 2023-01-14 NOTE — Progress Notes (Signed)
Hematology/Oncology Progress note [phone] [phone]       CHIEF COMPLAINTS/PURPOSE OF CONSULTATION:  Metastatic non-small cell lung cancer, brain metastasis.   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 Nicola Girt . Marland Kitchen  He has been on maintenance Pemetrexed + Keytruda starting cycle 5.  He gets treatment alternatively with [facility] and our cancer center.  I agree with [facility] to continuing current regimen given excellent tolerance and improvement in majority of disease.  CT scan/ MRI brain  done at [facility] showed stable disease. Proceed with cycle 10 Pemetrexed + Keytruda today    Metastasis to brain [facility]) S/p  palliative radiation, MRI brain was done at [facility] showed treatment response.  Continue observation. He gets surveillance MRI brain at [facility] for antineoplastic chemotherapy Treatment plan as discussed above.    Recommend influenza, patient would like to defer today as he does not want vaccination side effects to interfere upcoming trip. He plans to get vaccination at local pharmacy.   Follow-up  3 weeks lab [facility] chemotherapy.  All questions were answered. The patient knows to call the clinic with any problems, questions or concerns.  Rickard Patience, MD, PhD [facility] Hematology Oncology 01/14/2023   HISTORY OF PRESENTING ILLNESS:  Kurt Ewing 67 y.o. male presents to establish care for metastatic lung cancer.  Patient is a lifelong never smoker.  Oncology History  Metastatic non-small cell lung cancer (HCC)  04/30/2022 Imaging   CT chest w  contrast showed  1. Perihilar right upper lobe masslike opacity measuring 3.4 x 5.6 x 3.8 cm, This abuts the major fissure 2. Moderate-sized right pleural effusion which is partially loculated tracks anteriorly the lower hemithorax. Possible occasional pleural enhancement and thickening posteriorly. 3. Rounded masslike opacity in the dependent right lower lobe measuring 5.8 x 4.1 cm 4. Additional bilateral pulmonary nodules, largest measuring 7 mm in the right upper lobe. 5. Prominent mediastinal and right hilar lymph nodes.  6   Low-density 2.4 cm right adrenal nodule. This is unchanged from 12/15/2019 abdominal CT in typical of adenoma   05/05/2022 Procedure   status post ultrasound-guided right thoracentesis.  Cytology showed suspicious for malignancy, background lymphocyte predominant effusion.    05/13/2022 Imaging   PET scan initial staging showed 1. 5.9 by 3.5 cm right hilar mass with maximum SUV 5.7, compatible with malignancy. The epicenter of this mass appears to be in the right upper lobe. Faintly increased activity in the right hilar lymph node which could be involved. 2. Large right pleural effusion with pleural thickening and subtle accentuated activity along the pleural surface without a well-defined masslike pleural lesion. Correlate with recent thoracocentesis results. 3. Low-grade activity in the collapsed right lower lobe, maximum SUV 3.2, probably inflammatory. 4. No findings of malignancy outside of the chest. 5. 2.4 cm right adrenal adenoma does not require follow up. 6. 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 Lumbar spondylosis and degenerative disc disease. 7. Aortic atherosclerosis.   05/21/2022 Imaging   MRI brain with and without contrast showed  multiple peripherally enhancing lesions in the right middle cerebral hemispheres, concerning for metastatic disease.   05/29/2022 Initial Diagnosis   Metastatic non-small cell lung cancer  (HCC)  02/25/22 patient was seen by PCP and was diagnosed with bronchitis, was treated with Azithromycin.  Cough did not improve. He took levaquin for 10 days without improvement either.   05/29/2022 status post microscopy biopsy. Right upper lobe biopsy-non-small cell carcinoma, favor adenocarcinoma. Right lower lobe biopsy-nondiagnostic reactive bronchial cells Right upper lobe ENB assisted FNA, positive for malignancy.  Non-small cell carcinoma, favor adenocarcinoma. Right upper lobe lung ENB with depression-positive for malignancy, non-small cell carcinoma is present Station 10R -positive for malignancy consistent with metastatic non-small cell carcinoma involving lymphoid material. Station 11 R-FNA showed suspicious for malignancy.  Single atypical epithelial groups in a hypocellular specimen Station 7-FNA showed positive for malignancy.  Consistent with metastatic non-small cell carcinoma involving lymphoid material   05/29/2022 Cancer Staging   Staging form: Lung, AJCC 8th Edition - Clinical stage from 05/29/2022: Stage IV (cT3, cN2, cM1) - Signed by Rickard Patience, MD on 06/03/2022 Stage prefix: Initial diagnosis   06/22/2022 -  Chemotherapy   Cycle 1 LUNG Carboplatin + Pemetrexed + Keytruda D1 q21d Induction x 6 Cycles  Starting cycle 2, patient receives carboplatin and pemetrexed Cycle 3 and Cycle 4 patient elected to go to [facility] and received Carboplatin + Pemetrexed + Keytruda Cycle 5 Pemetrexed + Keytruda at [facility].  Cycle 6 pemetrexed + Keytruda      07/07/2022 Procedure   Second opinion at [facility].  Repeat biopsy of the right pleura.  Pathology is positive for metastatic adenocarcinoma.  Pleural fluid cytology was positive.  Patient had right pleural catheter placed.  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  Numerous (approximately 20) enhancing lesions with mixed interval change relative to  05/21/2022, as detailed above.  2.  No midline shift or hydrocephalus. No evidence of leptomeningeal disease.    09/29/2022 Imaging   CT chest abdomen pelvis w contrast  Interval decrease in size of dominant right upper lobe mass.  Unchanged right anterior peridiaphragmatic lymph node suspicious for nodal metastases  Stable small right pleural effusion. Nodular right pleural thickening consistent with pleural metastasis  Increase in size of some of the pulmonary metastases    12/22/2022 Imaging   CT @ [facility] chest abdomen pelvis w contrast showed  Stable primary tumor in the right upper lobe. Stable pulmonary and right pleural metastases.  *  Stable right anterior diaphragmatic lymph nodes, likely metastatic.  *  Stable small hypodensities in the liver that may represent metastatic disease.     12/22/2022 Imaging   MRI brain w wo contrast @ [facility]  1.  Slight interval improvement in the six nonradiated metastases.  2.  Stable remainder of the nonradiated metastasis.  3.  No new or progressive intracranial metastases.       INTERVAL HISTORY Kurt Ewing is a 67 y.o. male who has above history reviewed by me today presents for follow up visit for metastatic lung non small cell lung cancer.    He reports feeling well.  No new complaints. + URI and was treated with a course of Amoxicillin and tapering steroids. Symptoms have mostly resolved. No fever chills.   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    SURGICAL HISTORY: Past Surgical History:  Procedure Laterality Date   COLONOSCOPY     IR CV LINE INJECTION  10/16/2022   IR IMAGING GUIDED PORT INSERTION  06/19/2022   IR REMOVAL OF PLURAL CATH W/CUFF  07/24/2022   KNEE ARTHROSCOPY Right 2005   TONSILLECTOMY  1970   VIDEO BRONCHOSCOPY WITH ENDOBRONCHIAL ULTRASOUND N/A 05/29/2022   Procedure: VIDEO BRONCHOSCOPY WITH  ENDOBRONCHIAL ULTRASOUND;  Surgeon: Vida Rigger, MD;  Location: [facility] ORS;  Service: Thoracic;  Laterality: N/A;    SOCIAL HISTORY: Social History   Socioeconomic History   Marital status: Married    Spouse name: Clydie Braun   Number of children: 3   Years of education: Not on file   Highest education level: Not on file  Occupational History   Not on file  Tobacco Use   Smoking status: Never   Smokeless tobacco: Never  Vaping Use   Vaping status: Never Used  Substance and Sexual Activity   Alcohol use: Yes    Comment: weekly   Drug use: Not Currently   Sexual activity: Not on file  Other Topics Concern   Not on file  Social History Narrative   Lives at home with wife Clydie Braun; 3 kids & 10 grandchildren    Social Determinants of Health   Financial Resource Strain: Not on file  Food Insecurity: No Food Insecurity (07/24/2022)   Hunger Vital Sign    Worried About Running Out of Food in the Last Year: Never true    Ran Out of Food in the Last Year: Never true  Transportation Needs: No Transportation Needs (07/24/2022)   PRAPARE - Administrator, Civil Service (Medical): No    Lack of Transportation (Non-Medical): No  Physical Activity: Not on file  Stress: Not on file  Social Connections: Not on file  Intimate Partner Violence: Not At Risk (07/24/2022)   Humiliation, Afraid, Rape, and Kick questionnaire    Fear of Current or Ex-Partner: No    Emotionally Abused: No    Physically Abused: No    Sexually Abused: No    FAMILY HISTORY: Family History  Problem Relation Age of Onset   Hypertension Father    Heart disease Father    Hypertension Brother    COPD Brother     ALLERGIES:  has No Known Allergies.  MEDICATIONS:  Current Outpatient Medications  Medication Sig Dispense Refill   acetaminophen (TYLENOL) 500 MG tablet Take 500 mg by mouth every 6 (six) hours as needed for mild pain.     amoxicillin-clavulanate (AUGMENTIN) 875-125 MG tablet Take by mouth.  For URI     famotidine (PEPCID) 40 MG tablet Take 1 tablet by mouth at bedtime.     folic acid (FOLVITE) 1 MG tablet Take 1 tablet (1 mg total) by mouth daily. Start 7 days before pemetrexed chemotherapy. Continue until 21 days after pemetrexed completed. 100 tablet 3   melatonin 3 MG TABS tablet Take by mouth.     ondansetron (ZOFRAN) 8 MG tablet Take 8 mg by mouth every 8 (eight) hours as needed for nausea or vomiting.     chlorhexidine (HIBICLENS) 4 % external liquid Apply topically daily. Wash below neck (Patient not taking: Reported on 10/09/2022) 120 mL 0   doxycycline (VIBRAMYCIN) 100 MG capsule Take 100 mg by mouth daily. MRSA prophylxis as needed (Patient not taking: Reported on 01/14/2023)     lidocaine-prilocaine (EMLA) cream Apply 1  Application topically as needed. (Patient not taking: Reported on 10/09/2022) 30 g 2   No current facility-administered medications for this visit.   Facility-Administered Medications Ordered in Other Visits  Medication Dose Route Frequency Provider Last Rate Last Admin   heparin lock flush 100 unit/mL  500 Units Intracatheter Once PRN Rickard Patience, MD       sodium chloride flush (NS) 0.9 % injection 10 mL  10 mL Intravenous PRN Rickard Patience, MD   10 mL at 08/10/22 1610    Review of Systems  Constitutional:  Negative for appetite change, chills, fatigue, fever and unexpected weight change.  HENT:   Positive for hearing loss. Negative for voice change.        Tinnitus   Eyes:  Negative for eye problems and icterus.  Respiratory:  Negative for chest tightness, cough and shortness of breath.   Cardiovascular:  Negative for chest pain and leg swelling.  Gastrointestinal:  Negative for abdominal distention and abdominal pain.  Endocrine: Negative for hot flashes.  Genitourinary:  Negative for difficulty urinating, dysuria and frequency.   Musculoskeletal:  Negative for arthralgias.  Skin:  Positive for itching. Negative for rash.  Neurological:  Negative for  light-headedness and numbness.  Hematological:  Negative for adenopathy. Does not bruise/bleed easily.  Psychiatric/Behavioral:  Negative for confusion.      PHYSICAL EXAMINATION: ECOG PERFORMANCE STATUS: 0 - Asymptomatic  Vitals:   01/14/23 0906  BP: (!) 141/89  Pulse: 66  Resp: 18  Temp: 97.6 F (36.4 C)  SpO2: 98%   Filed Weights   01/14/23 0906  Weight: 185 lb 12.8 oz (84.3 kg)    Physical Exam Constitutional:      General: He is not in acute distress.    Appearance: He is not diaphoretic.  HENT:     Head: Normocephalic and atraumatic.  Eyes:     General: No scleral icterus. Cardiovascular:     Rate and Rhythm: Normal rate.     Heart sounds: No murmur heard. Pulmonary:     Effort: Pulmonary effort is normal. No respiratory distress.     Comments: Decreased right lower lung breath sound Abdominal:     General: Bowel sounds are normal. There is no distension.     Palpations: Abdomen is soft.  Musculoskeletal:        General: Normal range of motion.     Cervical back: Normal range of motion.  Skin:    General: Skin is warm and dry.     Findings: No erythema.  Neurological:     Mental Status: He is alert and oriented to person, place, and time. Mental status is at baseline.     Cranial Nerves: No cranial nerve deficit.     Motor: No abnormal muscle tone.  Psychiatric:        Mood and Affect: Mood and affect normal.     LABORATORY DATA:  I have reviewed the data as listed    Latest Ref Rng & Units 01/14/2023    8:50 AM 12/03/2022   10:11 AM 10/20/2022   10:16 AM  CBC  WBC 4.0 - 10.5 K/uL 8.6  7.0  5.7   Hemoglobin 13.0 - 17.0 g/dL 96.0  45.4  09.8   Hematocrit 39.0 - 52.0 % 35.6  35.0  33.7   Platelets 150 - 400 K/uL 282  273  274       Latest Ref Rng & Units 01/14/2023    8:50 AM 12/03/2022   10:11 AM  10/20/2022   10:16 AM  CMP  Glucose 70 - 99 mg/dL 130  865  98   BUN 8 - 23 mg/dL 23  18  15    Creatinine 0.61 - 1.24 mg/dL 7.84  6.96  2.95   Sodium  135 - 145 mmol/L 136  137  137   Potassium 3.5 - 5.1 mmol/L 4.1  4.0  3.9   Chloride 98 - 111 mmol/L 102  106  104   CO2 22 - 32 mmol/L 23  25  23    Calcium 8.9 - 10.3 mg/dL 8.6  8.5  8.7   Total Protein 6.5 - 8.1 g/dL 7.1  6.8  6.9   Total Bilirubin 0.3 - 1.2 mg/dL 0.8  1.1  1.0   Alkaline Phos 38 - 126 U/L 91  81  73   AST 15 - 41 U/L 26  26  22    ALT 0 - 44 U/L 34  24  14      RADIOGRAPHIC STUDIES: I have personally reviewed the radiological images as listed and agreed with the findings in the report. No results found.

## 2023-01-14 NOTE — Assessment & Plan Note (Signed)
Treatment plan as discussed above.

## 2023-01-14 NOTE — Progress Notes (Signed)
Pt here for follow up. 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 He has had a hearing test which was "marginal". Pt also reports having trouble remembering

## 2023-01-14 NOTE — Patient Instructions (Signed)
[place] [facility]  Discharge Instructions: Thank you for choosing [facility] to provide your oncology and hematology care.  If you have a lab appointment with the Cancer Center, please go directly to the Cancer Center and check in at the registration area.  Wear comfortable clothing and clothing appropriate for easy access to any Portacath or PICC line.   We strive to give you quality time with your provider. You may need to reschedule your appointment if you arrive late (15 or more minutes).  Arriving late affects you and other patients whose appointments are after yours.  Also, if you miss three or more appointments without notifying the office, you may be dismissed from the clinic at the provider's discretion.      For prescription refill requests, have your pharmacy contact our office and allow 72 hours for refills to be completed.    Today you received the following chemotherapy and/or immunotherapy agents KEYTRUDA and ALIMTA      To help prevent nausea and vomiting after your treatment, we encourage you to take your nausea medication as directed.  BELOW ARE SYMPTOMS THAT SHOULD BE REPORTED IMMEDIATELY: *FEVER GREATER THAN 100.4 F (38 C) OR HIGHER *CHILLS OR SWEATING *NAUSEA AND VOMITING THAT IS NOT CONTROLLED WITH YOUR NAUSEA MEDICATION *UNUSUAL SHORTNESS OF BREATH *UNUSUAL BRUISING OR BLEEDING *URINARY PROBLEMS (pain or burning when urinating, or frequent urination) *BOWEL PROBLEMS (unusual diarrhea, constipation, pain near the anus) TENDERNESS IN MOUTH AND THROAT WITH OR WITHOUT PRESENCE OF ULCERS (sore throat, sores in mouth, or a toothache) UNUSUAL RASH, SWELLING OR PAIN  UNUSUAL VAGINAL DISCHARGE OR ITCHING   Items with * indicate a potential emergency and should be followed up as soon as possible or go to the Emergency Department if any problems should occur.  Please show the CHEMOTHERAPY ALERT CARD or IMMUNOTHERAPY ALERT CARD at  check-in to the Emergency Department and triage nurse.  Should you have questions after your visit or need to cancel or reschedule your appointment, please contact [facility] [facility]  [phone] and follow the prompts.  Office hours are 8:00 a.m. to 4:30 p.m. Monday - Friday. Please note that voicemails left after 4:00 p.m. may not be returned until the following business day.  We are closed weekends and major holidays. You have access to a nurse at all times for urgent questions. Please call the main number to the clinic [phone] and follow the prompts.  For any non-urgent questions, you may also contact your provider using MyChart. We now offer e-Visits for anyone 31 and older to request care online for non-urgent symptoms. For details visit [email or web address].   Also download the MyChart app! Go to the app store, search "MyChart", open the app, select , and log in with your MyChart username and password.  Pembrolizumab Injection What is this medication? PEMBROLIZUMAB (PEM broe LIZ ue mab) treats some types of cancer. It works by helping your immune system slow or stop the spread of cancer cells. It is a monoclonal antibody. This medicine may be used for other purposes; ask your health care provider or pharmacist if you have questions. COMMON BRAND NAME(S): Keytruda What should I tell my care team before I take this medication? They need to know if you have any of these conditions: Allogeneic stem cell transplant (uses someone else's stem cells) Autoimmune diseases, such as Crohn disease, ulcerative colitis, lupus History of chest radiation Nervous system problems, such as Guillain-Barre syndrome,  myasthenia gravis Organ transplant An unusual or allergic reaction to pembrolizumab, other medications, foods, dyes, or preservatives Pregnant or trying to get pregnant Breast-feeding How should I use this medication? This medication is injected  into a vein. It is given by your care team in a hospital or clinic setting. A special MedGuide will be given to you before each treatment. Be sure to read this information carefully each time. Talk to your care team about the use of this medication in children. While it may be prescribed for children as young as 6 months for selected conditions, precautions do apply. Overdosage: If you think you have taken too much of this medicine contact a poison control center or emergency room at once. NOTE: This medicine is only for you. Do not share this medicine with others. What if I miss a dose? Keep appointments for follow-up doses. It is important not to miss your dose. Call your care team if you are unable to keep an appointment. What may interact with this medication? Interactions have not been studied. This list may not describe all possible interactions. Give your health care provider a list of all the medicines, herbs, non-prescription drugs, or dietary supplements you use. Also tell them if you smoke, drink alcohol, or use illegal drugs. Some items may interact with your medicine. What should I watch for while using this medication? Your condition will be monitored carefully while you are receiving this medication. You may need blood work while taking this medication. This medication may cause serious skin reactions. They can happen weeks to months after starting the medication. Contact your care team right away if you notice fevers or flu-like symptoms with a rash. The rash may be red or purple and then turn into blisters or peeling of the skin. You may also notice a red rash with swelling of the face, lips, or lymph nodes in your neck or under your arms. Tell your care team right away if you have any change in your eyesight. Talk to your care team if you may be pregnant. Serious birth defects can occur if you take this medication during pregnancy and for 4 months after the last dose. You will need a  negative pregnancy test before starting this medication. Contraception is recommended while taking this medication and for 4 months after the last dose. Your care team can help you find the option that works for you. Do not breastfeed while taking this medication and for 4 months after the last dose. What side effects may I notice from receiving this medication? Side effects that you should report to your care team as soon as possible: Allergic reactions--skin rash, itching, hives, swelling of the face, lips, tongue, or throat Dry cough, shortness of breath or trouble breathing Eye pain, redness, irritation, or discharge with blurry or decreased vision Heart muscle inflammation--unusual weakness or fatigue, shortness of breath, chest pain, fast or irregular heartbeat, dizziness, swelling of the ankles, feet, or hands Hormone gland problems--headache, sensitivity to light, unusual weakness or fatigue, dizziness, fast or irregular heartbeat, increased sensitivity to cold or heat, excessive sweating, constipation, hair loss, increased thirst or amount of urine, tremors or shaking, irritability Infusion reactions--chest pain, shortness of breath or trouble breathing, feeling faint or lightheaded Kidney injury (glomerulonephritis)--decrease in the amount of urine, red or dark brown urine, foamy or bubbly urine, swelling of the ankles, hands, or feet Liver injury--right upper belly pain, loss of appetite, nausea, light-colored stool, dark yellow or brown urine, yellowing skin or eyes,  unusual weakness or fatigue Pain, tingling, or numbness in the hands or feet, muscle weakness, change in vision, confusion or trouble speaking, loss of balance or coordination, trouble walking, seizures Rash, fever, and swollen lymph nodes Redness, blistering, peeling, or loosening of the skin, including inside the mouth Sudden or severe stomach pain, bloody diarrhea, fever, nausea, vomiting Side effects that usually do not  require medical attention (report to your care team if they continue or are bothersome): Bone, joint, or muscle pain Diarrhea Fatigue Loss of appetite Nausea Skin rash This list may not describe all possible side effects. Call your doctor for medical advice about side effects. You may report side effects to FDA at 1-800-FDA-1088. Where should I keep my medication? This medication is given in a hospital or clinic. It will not be stored at home. NOTE: This sheet is a summary. It may not cover all possible information. If you have questions about this medicine, talk to your doctor, pharmacist, or health care provider.  2024 Elsevier/Gold Standard (2021-08-26 00:00:00)  Pemetrexed Injection What is this medication? PEMETREXED (PEM e TREX ed) treats some types of cancer. It works by slowing down the growth of cancer cells. This medicine may be used for other purposes; ask your health care provider or pharmacist if you have questions. COMMON BRAND NAME(S): Alimta, PEMFEXY, PEMRYDI RTU What should I tell my care team before I take this medication? They need to know if you have any of these conditions: Infection, such as chickenpox, cold sores, or herpes Kidney disease Low blood cell levels (white cells, red cells, and platelets) Lung or breathing disease, such as asthma Radiation therapy An unusual or allergic reaction to pemetrexed, other medications, foods, dyes, or preservatives If you or your partner are pregnant or trying to get pregnant Breast-feeding How should I use this medication? This medication is injected into a vein. It is given by your care team in a hospital or clinic setting. Talk to your care team about the use of this medication in children. Special care may be needed. Overdosage: If you think you have taken too much of this medicine contact a poison control center or emergency room at once. NOTE: This medicine is only for you. Do not share this medicine with  others. What if I miss a dose? Keep appointments for follow-up doses. It is important not to miss your dose. Call your care team if you are unable to keep an appointment. What may interact with this medication? Do not take this medication with any of the following: Live virus vaccines This medication may also interact with the following: Ibuprofen This list may not describe all possible interactions. Give your health care provider a list of all the medicines, herbs, non-prescription drugs, or dietary supplements you use. Also tell them if you smoke, drink alcohol, or use illegal drugs. Some items may interact with your medicine. What should I watch for while using this medication? Your condition will be monitored carefully while you are receiving this medication. This medication may make you feel generally unwell. This is not uncommon as chemotherapy can affect healthy cells as well as cancer cells. Report any side effects. Continue your course of treatment even though you feel ill unless your care team tells you to stop. This medication can cause serious side effects. To reduce the risk, your care team may give you other medications to take before receiving this one. Be sure to follow the directions from your care team. This medication can cause a rash  or redness in areas of the body that have previously had radiation therapy. If you have had radiation therapy, tell your care team if you notice a rash in this area. This medication may increase your risk of getting an infection. Call your care team for advice if you get a fever, chills, sore throat, or other symptoms of a cold or flu. Do not treat yourself. Try to avoid being around people who are sick. Be careful brushing or flossing your teeth or using a toothpick because you may get an infection or bleed more easily. If you have any dental work done, tell your dentist you are receiving this medication. Avoid taking medications that contain  aspirin, acetaminophen, ibuprofen, naproxen, or ketoprofen unless instructed by your care team. These medications may hide a fever. Check with your care team if you have severe diarrhea, nausea, and vomiting, or if you sweat a lot. The loss of too much body fluid may make it dangerous for you to take this medication. Talk to your care team if you or your partner wish to become pregnant or think either of you might be pregnant. This medication can cause serious birth defects if taken during pregnancy and for 6 months after the last dose. A negative pregnancy test is required before starting this medication. A reliable form of contraception is recommended while taking this medication and for 6 months after the last dose. Talk to your care team about reliable forms of contraception. Do not father a child while taking this medication and for 3 months after the last dose. Use a condom while having sex during this time period. Do not breastfeed while taking this medication and for 1 week after the last dose. This medication may cause infertility. Talk to your care team if you are concerned about your fertility. What side effects may I notice from receiving this medication? Side effects that you should report to your care team as soon as possible: Allergic reactions--skin rash, itching, hives, swelling of the face, lips, tongue, or throat Dry cough, shortness of breath or trouble breathing Infection--fever, chills, cough, sore throat, wounds that don't heal, pain or trouble when passing urine, general feeling of discomfort or being unwell Kidney injury--decrease in the amount of urine, swelling of the ankles, hands, or feet Low red blood cell level--unusual weakness or fatigue, dizziness, headache, trouble breathing Redness, blistering, peeling, or loosening of the skin, including inside the mouth Unusual bruising or bleeding Side effects that usually do not require medical attention (report to your care team  if they continue or are bothersome): Fatigue Loss of appetite Nausea Vomiting This list may not describe all possible side effects. Call your doctor for medical advice about side effects. You may report side effects to FDA at 1-800-FDA-1088. Where should I keep my medication? This medication is given in a hospital or clinic. It will not be stored at home. NOTE: This sheet is a summary. It may not cover all possible information. If you have questions about this medicine, talk to your doctor, pharmacist, or health care provider.  2024 Elsevier/Gold Standard (2021-08-19 00:00:00)

## 2023-01-15 LAB — T4: T4, Total: 8 ug/dL (ref 4.5–12.0)

## 2023-01-16 ENCOUNTER — Other Ambulatory Visit: Payer: Self-pay

## 2023-01-20 ENCOUNTER — Other Ambulatory Visit: Payer: Self-pay | Admitting: Oncology

## 2023-01-22 ENCOUNTER — Other Ambulatory Visit: Payer: Self-pay | Admitting: Oncology

## 2023-01-22 DIAGNOSIS — C3490 Malignant neoplasm of unspecified part of unspecified bronchus or lung: Secondary | ICD-10-CM

## 2023-02-03 MED FILL — Dexamethasone Sodium Phosphate Inj 100 MG/10ML: INTRAMUSCULAR | Qty: 1 | Status: AC

## 2023-02-04 ENCOUNTER — Inpatient Hospital Stay: Payer: Medicare Other | Admitting: Oncology

## 2023-02-04 ENCOUNTER — Inpatient Hospital Stay: Payer: Medicare Other

## 2023-02-04 ENCOUNTER — Telehealth: Payer: Self-pay | Admitting: Oncology

## 2023-02-04 NOTE — Telephone Encounter (Signed)
Pt missed appt today, called pt spouse and left vm to call us back to let us know when they would like to r/s appts. Appts will have to be adjusted by MD in [place] .

## 2023-02-04 NOTE — Assessment & Plan Note (Deleted)
S/p  palliative radiation, MRI brain was done at [facility] [place] showed treatment response.  Continue observation. He gets surveillance MRI brain at [facility]

## 2023-02-04 NOTE — Assessment & Plan Note (Deleted)
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 Altan] . .  on maintenance Pemetrexed + Keytruda starting cycle 5.  He gets treatment alternatively with [facility] and our cancer center.  I agree with [facility] to continuing current regimen given excellent tolerance and improvement in majority of disease.  CT scan/ MRI brain  done at [facility] showed stable disease. Proceed with cycle 11 Pemetrexed + Keytruda today

## 2023-02-05 ENCOUNTER — Other Ambulatory Visit: Payer: Self-pay | Admitting: Oncology

## 2023-02-05 ENCOUNTER — Encounter: Payer: Self-pay | Admitting: Oncology

## 2023-02-05 DIAGNOSIS — C3490 Malignant neoplasm of unspecified part of unspecified bronchus or lung: Secondary | ICD-10-CM

## 2023-02-05 NOTE — Telephone Encounter (Signed)
Pt walked in to clinic this morning stating his flight was delayed and he missed his treatment yesterday. He was hoping to get it today. Infusion is not able to accommodate today. Waiting on pharmacy to see when medication will be available next week and to coordinate with infusion chair. Pt informed of this and that it most likely be early next week when he can get tx.   Please contact pt with appts, once scheduled.

## 2023-02-05 NOTE — Telephone Encounter (Signed)
Dr. Cathie Hoops updated IS to 10/16

## 2023-02-06 ENCOUNTER — Other Ambulatory Visit: Payer: Self-pay

## 2023-02-09 MED FILL — Dexamethasone Sodium Phosphate Inj 100 MG/10ML: INTRAMUSCULAR | Qty: 1 | Status: AC

## 2023-02-10 ENCOUNTER — Encounter: Payer: Self-pay | Admitting: Oncology

## 2023-02-10 ENCOUNTER — Inpatient Hospital Stay (HOSPITAL_BASED_OUTPATIENT_CLINIC_OR_DEPARTMENT_OTHER): Payer: Medicare Other | Admitting: Oncology

## 2023-02-10 ENCOUNTER — Inpatient Hospital Stay: Payer: Medicare Other | Attending: Oncology

## 2023-02-10 ENCOUNTER — Inpatient Hospital Stay: Payer: Medicare Other

## 2023-02-10 VITALS — BP 145/103 | HR 70 | Temp 97.4°F | Wt 187.1 lb

## 2023-02-10 DIAGNOSIS — Z5111 Encounter for antineoplastic chemotherapy: Secondary | ICD-10-CM | POA: Insufficient documentation

## 2023-02-10 DIAGNOSIS — C7931 Secondary malignant neoplasm of brain: Secondary | ICD-10-CM | POA: Insufficient documentation

## 2023-02-10 DIAGNOSIS — C3490 Malignant neoplasm of unspecified part of unspecified bronchus or lung: Secondary | ICD-10-CM

## 2023-02-10 DIAGNOSIS — C3411 Malignant neoplasm of upper lobe, right bronchus or lung: Secondary | ICD-10-CM | POA: Insufficient documentation

## 2023-02-10 DIAGNOSIS — Z23 Encounter for immunization: Secondary | ICD-10-CM | POA: Diagnosis not present

## 2023-02-10 DIAGNOSIS — C782 Secondary malignant neoplasm of pleura: Secondary | ICD-10-CM | POA: Insufficient documentation

## 2023-02-10 DIAGNOSIS — Z5112 Encounter for antineoplastic immunotherapy: Secondary | ICD-10-CM | POA: Insufficient documentation

## 2023-02-10 LAB — COMPREHENSIVE METABOLIC PANEL
ALT: 25 U/L (ref 0–44)
AST: 30 U/L (ref 15–41)
Albumin: 3.3 g/dL — ABNORMAL LOW (ref 3.5–5.0)
Alkaline Phosphatase: 92 U/L (ref 38–126)
Anion gap: 5 (ref 5–15)
BUN: 18 mg/dL (ref 8–23)
CO2: 25 mmol/L (ref 22–32)
Calcium: 8.7 mg/dL — ABNORMAL LOW (ref 8.9–10.3)
Chloride: 107 mmol/L (ref 98–111)
Creatinine, Ser: 1.14 mg/dL (ref 0.61–1.24)
GFR, Estimated: >60 mL/min (ref >60)
Glucose, Bld: 100 mg/dL — ABNORMAL HIGH (ref 70–99)
Potassium: 4 mmol/L (ref 3.5–5.1)
Sodium: 137 mmol/L (ref 135–145)
Total Bilirubin: 1 mg/dL (ref 0.3–1.2)
Total Protein: 6.9 g/dL (ref 6.5–8.1)

## 2023-02-10 LAB — CBC WITH DIFFERENTIAL/PLATELET
Abs Immature Granulocytes: 0.04 10*3/uL (ref 0.00–0.07)
Basophils Absolute: 0.1 10*3/uL (ref 0.0–0.1)
Basophils Relative: 2 %
Eosinophils Absolute: 0.4 10*3/uL (ref 0.0–0.5)
Eosinophils Relative: 5 %
HCT: 36 % — ABNORMAL LOW (ref 39.0–52.0)
Hemoglobin: 12.1 g/dL — ABNORMAL LOW (ref 13.0–17.0)
Immature Granulocytes: 1 %
Lymphocytes Relative: 15 %
Lymphs Abs: 1.1 10*3/uL (ref 0.7–4.0)
MCH: 33.2 pg (ref 26.0–34.0)
MCHC: 33.6 g/dL (ref 30.0–36.0)
MCV: 98.6 fL (ref 80.0–100.0)
Monocytes Absolute: 1 10*3/uL (ref 0.1–1.0)
Monocytes Relative: 13 %
Neutro Abs: 4.6 10*3/uL (ref 1.7–7.7)
Neutrophils Relative %: 64 %
Platelets: 263 10*3/uL (ref 150–400)
RBC: 3.65 MIL/uL — ABNORMAL LOW (ref 4.22–5.81)
RDW: 14.6 % (ref 11.5–15.5)
WBC: 7.2 10*3/uL (ref 4.0–10.5)
nRBC: 0 % (ref 0.0–0.2)

## 2023-02-10 MED ORDER — SODIUM CHLORIDE 0.9 % IV SOLN
500.0000 mg/m2 | Freq: Once | INTRAVENOUS | Status: AC
  Administered 2023-02-10: 1100 mg via INTRAVENOUS
  Filled 2023-02-10: qty 40

## 2023-02-10 MED ORDER — INFLUENZA VAC A&B SA ADJ QUAD 0.5 ML IM PRSY: 0.5000 mL | PREFILLED_SYRINGE | Freq: Once | INTRAMUSCULAR | Status: DC

## 2023-02-10 MED ORDER — ONDANSETRON HCL 4 MG/2ML IJ SOLN
8.0000 mg | Freq: Once | INTRAMUSCULAR | Status: AC
  Administered 2023-02-10: 8 mg via INTRAVENOUS
  Filled 2023-02-10: qty 4

## 2023-02-10 MED ORDER — SODIUM CHLORIDE 0.9 % IV SOLN
Freq: Once | INTRAVENOUS | Status: AC
  Filled 2023-02-10: qty 250

## 2023-02-10 MED ORDER — HEPARIN SOD (PORK) LOCK FLUSH 100 UNIT/ML IV SOLN
500.0000 [IU] | Freq: Once | INTRAVENOUS | Status: AC | PRN
  Administered 2023-02-10: 500 [IU]
  Filled 2023-02-10: qty 5

## 2023-02-10 MED ORDER — SODIUM CHLORIDE 0.9 % IV SOLN
200.0000 mg | Freq: Once | INTRAVENOUS | Status: AC
  Administered 2023-02-10: 200 mg via INTRAVENOUS
  Filled 2023-02-10: qty 200

## 2023-02-10 MED ORDER — INFLUENZA VAC A&B SURF ANT ADJ 0.5 ML IM SUSY
0.5000 mL | PREFILLED_SYRINGE | Freq: Once | INTRAMUSCULAR | Status: AC
  Administered 2023-02-10: 0.5 mL via INTRAMUSCULAR

## 2023-02-10 MED ORDER — SODIUM CHLORIDE 0.9 % IV SOLN
10.0000 mg | Freq: Once | INTRAVENOUS | Status: AC
  Administered 2023-02-10: 10 mg via INTRAVENOUS
  Filled 2023-02-10: qty 10
  Filled 2023-02-10: qty 1

## 2023-02-10 NOTE — Assessment & Plan Note (Signed)
Patient receives influenza vaccination today.

## 2023-02-10 NOTE — Progress Notes (Signed)
Hematology/Oncology Progress note [phone] [phone]       CHIEF COMPLAINTS/PURPOSE OF CONSULTATION:  Metastatic non-small cell lung cancer, brain metastasis.   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 Nicola Ewing . .  on maintenance Pemetrexed + Keytruda starting cycle 5.  He gets treatment alternatively with [facility] and [facility] cancer center.  I agree with [facility] to continuing current regimen given excellent tolerance and improvement in majority of disease.  CT scan/ MRI brain  done at [facility] showed stable disease. Proceed with Pemetrexed + Keytruda today  Patient will be at [facility] in 2 weeks and will have restaging CT scan done there.  He plans to return in 3 weeks to continue all treatments  Locally    Metastasis to brain [facility]) S/p  palliative radiation, MRI brain was done at [facility] showed treatment response.  Continue observation. He gets surveillance MRI brain at [facility] for antineoplastic chemotherapy Treatment plan as discussed above.   Need for prophylactic vaccination and inoculation against influenza Patient receives influenza vaccination today.   Follow-up  3 weeks lab [facility] chemotherapy.  All questions were answered. The patient knows to call the clinic with any problems, questions or concerns.  Kurt Patience, MD, PhD [facility] Hematology Oncology 02/10/2023   HISTORY OF PRESENTING ILLNESS:  Kurt Ewing 67 y.o. male presents to establish care for metastatic lung cancer.  Patient is a lifelong never smoker.  Oncology History   Metastatic non-small cell lung cancer (HCC)  04/30/2022 Imaging   CT chest w contrast showed  1. Perihilar right upper lobe masslike opacity measuring 3.4 x 5.6 x 3.8 cm, This abuts the major fissure 2. Moderate-sized right pleural effusion which is partially loculated tracks anteriorly the lower hemithorax. Possible occasional pleural enhancement and thickening posteriorly. 3. Rounded masslike opacity in the dependent right lower lobe measuring 5.8 x 4.1 cm 4. Additional bilateral pulmonary nodules, largest measuring 7 mm in the right upper lobe. 5. Prominent mediastinal and right hilar lymph nodes.  6   Low-density 2.4 cm right adrenal nodule. This is unchanged from 12/15/2019 abdominal CT in typical of adenoma   05/05/2022 Procedure   status post ultrasound-guided right thoracentesis.  Cytology showed suspicious for malignancy, background lymphocyte predominant effusion.    05/13/2022 Imaging   PET scan initial staging showed 1. 5.9 by 3.5 cm right hilar mass with maximum SUV 5.7, compatible with malignancy. The epicenter of this mass appears to be in the right upper lobe. Faintly increased activity in the right hilar lymph node which could be involved. 2. Large right pleural effusion with pleural thickening and subtle accentuated activity along the pleural surface without a well-defined masslike pleural lesion. Correlate with recent thoracocentesis results. 3. Low-grade activity in the collapsed right lower lobe, maximum SUV 3.2, probably inflammatory. 4. No findings of malignancy outside of the chest. 5. 2.4 cm right adrenal adenoma does not require follow up. 6. 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 Lumbar spondylosis and degenerative disc disease. 7. Aortic atherosclerosis.  05/21/2022 Imaging   MRI brain with and without contrast showed multiple peripherally enhancing lesions in the right middle cerebral hemispheres, concerning for metastatic  disease.   05/29/2022 Initial Diagnosis   Metastatic non-small cell lung cancer (HCC)  02/25/22 patient was seen by PCP and was diagnosed with bronchitis, was treated with Azithromycin.  Cough did not improve. He took levaquin for 10 days without improvement either.   05/29/2022 status post microscopy biopsy. Right upper lobe biopsy-non-small cell carcinoma, favor adenocarcinoma. Right lower lobe biopsy-nondiagnostic reactive bronchial cells Right upper lobe ENB assisted FNA, positive for malignancy.  Non-small cell carcinoma, favor adenocarcinoma. Right upper lobe lung ENB with depression-positive for malignancy, non-small cell carcinoma is present Station 10R -positive for malignancy consistent with metastatic non-small cell carcinoma involving lymphoid material. Station 11 R-FNA showed suspicious for malignancy.  Single atypical epithelial groups in a hypocellular specimen Station 7-FNA showed positive for malignancy.  Consistent with metastatic non-small cell carcinoma involving lymphoid material   05/29/2022 Cancer Staging   Staging form: Lung, AJCC 8th Edition - Clinical stage from 05/29/2022: Stage IV (cT3, cN2, cM1) - Signed by Kurt Patience, MD on 06/03/2022 Stage prefix: Initial diagnosis   06/22/2022 -  Chemotherapy   Cycle 1 LUNG Carboplatin + Pemetrexed + Keytruda D1 q21d Induction x 6 Cycles  Starting cycle 2, patient receives carboplatin and pemetrexed Cycle 3 and Cycle 4 patient elected to go to [facility] and received Carboplatin + Pemetrexed + Keytruda Cycle 5 Pemetrexed + Keytruda at [facility].  Cycle 6 pemetrexed + Keytruda      07/07/2022 Procedure   Second opinion at [facility].  Repeat biopsy of the right pleura.  Pathology is positive for metastatic adenocarcinoma.  Pleural fluid cytology was positive.  Patient had right pleural catheter placed.  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   Numerous (approximately 20) enhancing lesions with mixed interval change relative to 05/21/2022, as detailed above.  2.  No midline shift or hydrocephalus. No evidence of leptomeningeal disease.    09/29/2022 Imaging   CT chest abdomen pelvis w contrast  Interval decrease in size of dominant right upper lobe mass.  Unchanged right anterior peridiaphragmatic lymph node suspicious for nodal metastases  Stable small right pleural effusion. Nodular right pleural thickening consistent with pleural metastasis  Increase in size of some of the pulmonary metastases    12/22/2022 Imaging   CT @ [facility] chest abdomen pelvis w contrast showed  Stable primary tumor in the right upper lobe. Stable pulmonary and right pleural metastases.  *  Stable right anterior diaphragmatic lymph nodes, likely metastatic.  *  Stable small hypodensities in the liver that may represent metastatic disease.     12/22/2022 Imaging   MRI brain w wo contrast @ [facility]  1.  Slight interval improvement in the six nonradiated metastases.  2.  Stable remainder of the nonradiated metastasis.  3.  No new or progressive intracranial metastases.       INTERVAL HISTORY Kurt Ewing is a 67 y.o. male who has above history reviewed by me today presents for follow up visit for metastatic lung non small cell lung cancer.    He reports feeling well.  No new complaints. 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 He missed his chemotherapy appointment on 02/04/2023, rescheduled to get treatment today.  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    SURGICAL HISTORY: Past Surgical History:  Procedure Laterality Date   COLONOSCOPY     IR CV LINE INJECTION  10/16/2022   IR IMAGING GUIDED PORT INSERTION  06/19/2022   IR REMOVAL OF PLURAL CATH W/CUFF  07/24/2022   KNEE ARTHROSCOPY Right 2005   TONSILLECTOMY   1970   VIDEO BRONCHOSCOPY WITH ENDOBRONCHIAL ULTRASOUND N/A 05/29/2022   Procedure: VIDEO BRONCHOSCOPY WITH ENDOBRONCHIAL ULTRASOUND;  Surgeon: Vida Rigger, MD;  Location: [facility] ORS;  Service: Thoracic;  Laterality: N/A;    SOCIAL HISTORY: Social History   Socioeconomic History   Marital status: Married    Spouse name: Clydie Braun   Number of children: 3   Years of education: Not on file   Highest education level: Not on file  Occupational History   Not on file  Tobacco Use   Smoking status: Never   Smokeless tobacco: Never  Vaping Use   Vaping status: Never Used  Substance and Sexual Activity   Alcohol use: Yes    Comment: weekly   Drug use: Not Currently   Sexual activity: Not on file  Other Topics Concern   Not on file  Social History Narrative   Lives at home with wife Clydie Braun; 3 kids & 10 grandchildren    Social Determinants of Health   Financial Resource Strain: Not on file  Food Insecurity: No Food Insecurity (07/24/2022)   Hunger Vital Sign    Worried About Running Out of Food in the Last Year: Never true    Ran Out of Food in the Last Year: Never true  Transportation Needs: No Transportation Needs (07/24/2022)   PRAPARE - Administrator, Civil Service (Medical): No    Lack of Transportation (Non-Medical): No  Physical Activity: Not on file  Stress: Not on file  Social Connections: Not on file  Intimate Partner Violence: Not At Risk (07/24/2022)   Humiliation, Afraid, Rape, and Kick questionnaire    Fear of Current or Ex-Partner: No    Emotionally Abused: No    Physically Abused: No    Sexually Abused: No    FAMILY HISTORY: Family History  Problem Relation Age of Onset   Hypertension Father    Heart disease Father    Hypertension Brother    COPD Brother     ALLERGIES:  has No Known Allergies.  MEDICATIONS:  Current Outpatient Medications  Medication Sig Dispense Refill   acetaminophen (TYLENOL) 500 MG tablet Take 500 mg by mouth every 6  (six) hours as needed for mild pain.     famotidine (PEPCID) 40 MG tablet Take 1 tablet by mouth at bedtime.     folic acid (FOLVITE) 1 MG tablet Take 1 tablet (1 mg total) by mouth daily. Start 7 days before pemetrexed chemotherapy. Continue until 21 days after pemetrexed completed. 100 tablet 3   lidocaine-prilocaine (EMLA) cream Apply 1 Application topically as needed. 30 g 2   melatonin 3 MG TABS tablet Take by mouth.     ondansetron (ZOFRAN) 8 MG tablet Take 8 mg by mouth every 8 (eight) hours as needed for nausea or vomiting.     No current facility-administered medications for this visit.   Facility-Administered Medications Ordered in Other Visits  Medication Dose Route Frequency Provider Last Rate Last Admin   heparin lock flush 100 unit/mL  500 Units Intracatheter Once PRN Kurt Patience,  MD       sodium chloride flush (NS) 0.9 % injection 10 mL  10 mL Intravenous PRN Kurt Patience, MD   10 mL at 08/10/22 1610    Review of Systems  Constitutional:  Negative for appetite change, chills, fatigue, fever and unexpected weight change.  HENT:   Positive for hearing loss. Negative for voice change.        Tinnitus   Eyes:  Negative for eye problems and icterus.  Respiratory:  Negative for chest tightness, cough and shortness of breath.   Cardiovascular:  Negative for chest pain and leg swelling.  Gastrointestinal:  Negative for abdominal distention and abdominal pain.  Endocrine: Negative for hot flashes.  Genitourinary:  Negative for difficulty urinating, dysuria and frequency.   Musculoskeletal:  Negative for arthralgias.  Skin:  Positive for itching. Negative for rash.  Neurological:  Negative for light-headedness and numbness.  Hematological:  Negative for adenopathy. Does not bruise/bleed easily.  Psychiatric/Behavioral:  Negative for confusion.      PHYSICAL EXAMINATION: ECOG PERFORMANCE STATUS: 0 - Asymptomatic  Vitals:   02/10/23 1051  BP: (!) 145/103  Pulse: 70  Temp: (!)  97.4 F (36.3 C)  SpO2: 98%   Filed Weights   02/10/23 1051  Weight: 187 lb 1.6 oz (84.9 kg)    Physical Exam Constitutional:      General: He is not in acute distress.    Appearance: He is not diaphoretic.  HENT:     Head: Normocephalic and atraumatic.  Eyes:     General: No scleral icterus. Cardiovascular:     Rate and Rhythm: Normal rate.     Heart sounds: No murmur heard. Pulmonary:     Effort: Pulmonary effort is normal. No respiratory distress.     Comments: Decreased right lower lung breath sound Abdominal:     General: Bowel sounds are normal. There is no distension.     Palpations: Abdomen is soft.  Musculoskeletal:        General: Normal range of motion.     Cervical back: Normal range of motion.  Skin:    General: Skin is warm and dry.     Findings: No erythema.  Neurological:     Mental Status: He is alert and oriented to person, place, and time. Mental status is at baseline.     Cranial Nerves: No cranial nerve deficit.     Motor: No abnormal muscle tone.  Psychiatric:        Mood and Affect: Mood and affect normal.      LABORATORY DATA:  I have reviewed the data as listed    Latest Ref Rng & Units 02/10/2023   10:36 AM 01/14/2023    8:50 AM 12/03/2022   10:11 AM  CBC  WBC 4.0 - 10.5 K/uL 7.2  8.6  7.0   Hemoglobin 13.0 - 17.0 g/dL 96.0  45.4  09.8   Hematocrit 39.0 - 52.0 % 36.0  35.6  35.0   Platelets 150 - 400 K/uL 263  282  273       Latest Ref Rng & Units 02/10/2023   10:36 AM 01/14/2023    8:50 AM 12/03/2022   10:11 AM  CMP  Glucose 70 - 99 mg/dL 119  147  829   BUN 8 - 23 mg/dL 18  23  18    Creatinine 0.61 - 1.24 mg/dL 5.62  1.30  8.65   Sodium 135 - 145 mmol/L 137  136  137  Potassium 3.5 - 5.1 mmol/L 4.0  4.1  4.0   Chloride 98 - 111 mmol/L 107  102  106   CO2 22 - 32 mmol/L 25  23  25    Calcium 8.9 - 10.3 mg/dL 8.7  8.6  8.5   Total Protein 6.5 - 8.1 g/dL 6.9  7.1  6.8   Total Bilirubin 0.3 - 1.2 mg/dL 1.0  0.8  1.1   Alkaline  Phos 38 - 126 U/L 92  91  81   AST 15 - 41 U/L 30  26  26    ALT 0 - 44 U/L 25  34  24      RADIOGRAPHIC STUDIES: I have personally reviewed the radiological images as listed and agreed with the findings in the report. No results found.

## 2023-02-10 NOTE — Patient Instructions (Signed)

## 2023-02-10 NOTE — Assessment & Plan Note (Signed)
S/p  palliative radiation, MRI brain was done at [facility] showed treatment response.  Continue observation. He gets surveillance MRI brain at [facility]

## 2023-02-10 NOTE — Assessment & Plan Note (Signed)
Treatment plan as discussed above.

## 2023-02-10 NOTE — Progress Notes (Signed)
Pt here for follow up. Reports that one of his right ear is stopped up and has ringing.

## 2023-02-10 NOTE — Assessment & Plan Note (Addendum)
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 Altan] . .  on maintenance Pemetrexed + Keytruda starting cycle 5.  He gets treatment alternatively with [facility] and [facility] cancer center.  I agree with [facility] to continuing current regimen given excellent tolerance and improvement in majority of disease.  CT scan/ MRI brain  done at [facility] showed stable disease. Proceed with Pemetrexed + Keytruda today  Patient will be at [facility] in 2 weeks and will have restaging CT scan done there.  He plans to return in 3 weeks to continue all treatments  Locally

## 2023-02-12 ENCOUNTER — Other Ambulatory Visit: Payer: Self-pay

## 2023-02-26 ENCOUNTER — Other Ambulatory Visit: Payer: Self-pay

## 2023-03-02 ENCOUNTER — Encounter: Payer: Self-pay | Admitting: Oncology

## 2023-03-03 ENCOUNTER — Inpatient Hospital Stay: Payer: Medicare Other

## 2023-03-03 ENCOUNTER — Encounter: Payer: Self-pay | Admitting: Oncology

## 2023-03-03 ENCOUNTER — Inpatient Hospital Stay: Payer: Medicare Other | Attending: Oncology

## 2023-03-03 ENCOUNTER — Inpatient Hospital Stay (HOSPITAL_BASED_OUTPATIENT_CLINIC_OR_DEPARTMENT_OTHER): Payer: Medicare Other | Admitting: Oncology

## 2023-03-03 VITALS — BP 122/76 | HR 98 | Temp 97.8°F | Resp 18 | Wt 184.4 lb

## 2023-03-03 VITALS — BP 123/79 | HR 62

## 2023-03-03 DIAGNOSIS — Z5112 Encounter for antineoplastic immunotherapy: Secondary | ICD-10-CM | POA: Insufficient documentation

## 2023-03-03 DIAGNOSIS — Z79620 Long term (current) use of immunosuppressive biologic: Secondary | ICD-10-CM | POA: Diagnosis not present

## 2023-03-03 DIAGNOSIS — Z5111 Encounter for antineoplastic chemotherapy: Secondary | ICD-10-CM | POA: Insufficient documentation

## 2023-03-03 DIAGNOSIS — C782 Secondary malignant neoplasm of pleura: Secondary | ICD-10-CM | POA: Diagnosis not present

## 2023-03-03 DIAGNOSIS — C3490 Malignant neoplasm of unspecified part of unspecified bronchus or lung: Secondary | ICD-10-CM

## 2023-03-03 DIAGNOSIS — C7931 Secondary malignant neoplasm of brain: Secondary | ICD-10-CM | POA: Diagnosis not present

## 2023-03-03 DIAGNOSIS — C787 Secondary malignant neoplasm of liver and intrahepatic bile duct: Secondary | ICD-10-CM | POA: Diagnosis not present

## 2023-03-03 DIAGNOSIS — C3411 Malignant neoplasm of upper lobe, right bronchus or lung: Secondary | ICD-10-CM | POA: Diagnosis not present

## 2023-03-03 LAB — COMPREHENSIVE METABOLIC PANEL
ALT: 22 U/L (ref 0–44)
AST: 27 U/L (ref 15–41)
Albumin: 3.3 g/dL — ABNORMAL LOW (ref 3.5–5.0)
Alkaline Phosphatase: 99 U/L (ref 38–126)
Anion gap: 5 (ref 5–15)
BUN: 17 mg/dL (ref 8–23)
CO2: 26 mmol/L (ref 22–32)
Calcium: 8.8 mg/dL — ABNORMAL LOW (ref 8.9–10.3)
Chloride: 104 mmol/L (ref 98–111)
Creatinine, Ser: 1.17 mg/dL (ref 0.61–1.24)
GFR, Estimated: >60 mL/min (ref >60)
Glucose, Bld: 96 mg/dL (ref 70–99)
Potassium: 4 mmol/L (ref 3.5–5.1)
Sodium: 135 mmol/L (ref 135–145)
Total Bilirubin: 1.4 mg/dL — ABNORMAL HIGH (ref <1.2)
Total Protein: 6.9 g/dL (ref 6.5–8.1)

## 2023-03-03 LAB — CBC WITH DIFFERENTIAL/PLATELET
Abs Immature Granulocytes: 0.04 10*3/uL (ref 0.00–0.07)
Basophils Absolute: 0.1 10*3/uL (ref 0.0–0.1)
Basophils Relative: 1 %
Eosinophils Absolute: 0.4 10*3/uL (ref 0.0–0.5)
Eosinophils Relative: 6 %
HCT: 34 % — ABNORMAL LOW (ref 39.0–52.0)
Hemoglobin: 11.7 g/dL — ABNORMAL LOW (ref 13.0–17.0)
Immature Granulocytes: 1 %
Lymphocytes Relative: 12 %
Lymphs Abs: 0.9 10*3/uL (ref 0.7–4.0)
MCH: 33.4 pg (ref 26.0–34.0)
MCHC: 34.4 g/dL (ref 30.0–36.0)
MCV: 97.1 fL (ref 80.0–100.0)
Monocytes Absolute: 1 10*3/uL (ref 0.1–1.0)
Monocytes Relative: 14 %
Neutro Abs: 4.7 10*3/uL (ref 1.7–7.7)
Neutrophils Relative %: 66 %
Platelets: 282 10*3/uL (ref 150–400)
RBC: 3.5 MIL/uL — ABNORMAL LOW (ref 4.22–5.81)
RDW: 14.1 % (ref 11.5–15.5)
WBC: 7 10*3/uL (ref 4.0–10.5)
nRBC: 0 % (ref 0.0–0.2)

## 2023-03-03 LAB — TSH: TSH: 4.19 u[IU]/mL (ref 0.350–4.500)

## 2023-03-03 MED ORDER — DEXAMETHASONE SODIUM PHOSPHATE 10 MG/ML IJ SOLN
10.0000 mg | Freq: Once | INTRAMUSCULAR | Status: AC
  Administered 2023-03-03: 10 mg via INTRAVENOUS
  Filled 2023-03-03: qty 1

## 2023-03-03 MED ORDER — SODIUM CHLORIDE 0.9 % IV SOLN
200.0000 mg | Freq: Once | INTRAVENOUS | Status: AC
  Administered 2023-03-03: 200 mg via INTRAVENOUS
  Filled 2023-03-03: qty 8

## 2023-03-03 MED ORDER — SODIUM CHLORIDE 0.9 % IV SOLN
500.0000 mg/m2 | Freq: Once | INTRAVENOUS | Status: AC
  Administered 2023-03-03: 1100 mg via INTRAVENOUS
  Filled 2023-03-03: qty 40

## 2023-03-03 MED ORDER — SODIUM CHLORIDE 0.9% FLUSH
10.0000 mL | Freq: Once | INTRAVENOUS | Status: AC
Start: 2023-03-03 — End: 2023-03-03
  Administered 2023-03-03: 10 mL via INTRAVENOUS
  Filled 2023-03-03: qty 10

## 2023-03-03 MED ORDER — SODIUM CHLORIDE 0.9 % IV SOLN
Freq: Once | INTRAVENOUS | Status: AC
  Filled 2023-03-03: qty 250

## 2023-03-03 MED ORDER — HEPARIN SOD (PORK) LOCK FLUSH 100 UNIT/ML IV SOLN
500.0000 [IU] | Freq: Once | INTRAVENOUS | Status: AC | PRN
Start: 2023-03-03 — End: 2023-03-03
  Administered 2023-03-03: 500 [IU]
  Filled 2023-03-03: qty 5

## 2023-03-03 MED ORDER — ONDANSETRON HCL 4 MG/2ML IJ SOLN
8.0000 mg | Freq: Once | INTRAMUSCULAR | Status: AC
Start: 2023-03-03 — End: 2023-03-03
  Administered 2023-03-03: 8 mg via INTRAVENOUS
  Filled 2023-03-03: qty 4

## 2023-03-03 NOTE — Assessment & Plan Note (Signed)
S/p  palliative radiation, MRI brain was done at [facility] showed treatment response.  Continue observation. He gets surveillance MRI brain at [facility]

## 2023-03-03 NOTE — Assessment & Plan Note (Signed)
Treatment plan as discussed above.  

## 2023-03-03 NOTE — Progress Notes (Signed)
Hematology/Oncology Progress note [phone] [phone]       CHIEF COMPLAINTS/PURPOSE OF CONSULTATION:  Metastatic non-small cell lung cancer, brain metastasis.   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 Nicola Girt . .  on maintenance Pemetrexed + Keytruda starting cycle 5.  [facility] gets treatment [facility] with [facility] and [facility].  I agree with [facility] to continuing current regimen given excellent tolerance and improvement in majority of disease.  CT scan/ MRI brain  done at [facility] showed slow progression disease. [facility] recommends to proceed with current treatment and repeat PET, MRI brain in 4 week.  Proceed with Pemetrexed + Keytruda today     Metastasis to brain [facility]) S/p  palliative radiation, MRI brain was done at [facility] showed treatment response.  Continue observation. He gets surveillance MRI brain at [facility] for antineoplastic chemotherapy Treatment plan as discussed above.    Follow-up  3 weeks lab [facility] chemotherapy.  All questions were answered. The patient knows to call the clinic with any problems, questions or concerns.  Rickard Patience, MD, PhD [facility] Hematology Oncology 03/03/2023   HISTORY OF PRESENTING ILLNESS:  Kurt Ewing 67 y.o. male presents to establish care for metastatic lung cancer.  Patient is a lifelong never smoker.  Oncology History  Metastatic non-small cell lung cancer (HCC)  04/30/2022 Imaging   CT chest w contrast showed  1. Perihilar right upper lobe masslike opacity measuring 3.4 x 5.6 x 3.8 cm, This  abuts the major fissure 2. Moderate-sized right pleural effusion which is partially loculated tracks anteriorly the lower hemithorax. Possible occasional pleural enhancement and thickening posteriorly. 3. Rounded masslike opacity in the dependent right lower lobe measuring 5.8 x 4.1 cm 4. Additional bilateral pulmonary nodules, largest measuring 7 mm in the right upper lobe. 5. Prominent mediastinal and right hilar lymph nodes.  6   Low-density 2.4 cm right adrenal nodule. This is unchanged from 12/15/2019 abdominal CT in typical of adenoma   05/05/2022 Procedure   status post ultrasound-guided right thoracentesis.  Cytology showed suspicious for malignancy, background lymphocyte predominant effusion.    05/13/2022 Imaging   PET scan initial staging showed 1. 5.9 by 3.5 cm right hilar mass with maximum SUV 5.7, compatible with malignancy. The epicenter of this mass appears to be in the right upper lobe. Faintly increased activity in the right hilar lymph node which could be involved. 2. Large right pleural effusion with pleural thickening and subtle accentuated activity along the pleural surface without a well-defined masslike pleural lesion. Correlate with recent thoracocentesis results. 3. Low-grade activity in the collapsed right lower lobe, maximum SUV 3.2, probably inflammatory. 4. No findings of malignancy outside of the chest. 5. 2.4 cm right adrenal adenoma does not require follow up. 6. 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 Lumbar spondylosis and degenerative disc disease. 7. Aortic atherosclerosis.   05/21/2022 Imaging   MRI brain with and without contrast showed multiple peripherally enhancing lesions in the right middle cerebral hemispheres, concerning for metastatic disease.   05/29/2022  Initial Diagnosis   Metastatic non-small cell lung cancer (HCC)  02/25/22 patient was seen by PCP and was diagnosed with bronchitis, was treated with  Azithromycin.  Cough did not improve. He took levaquin for 10 days without improvement either.   05/29/2022 status post microscopy biopsy. Right upper lobe biopsy-non-small cell carcinoma, favor adenocarcinoma. Right lower lobe biopsy-nondiagnostic reactive bronchial cells Right upper lobe ENB assisted FNA, positive for malignancy.  Non-small cell carcinoma, favor adenocarcinoma. Right upper lobe lung ENB with depression-positive for malignancy, non-small cell carcinoma is present Station 10R -positive for malignancy consistent with metastatic non-small cell carcinoma involving lymphoid material. Station 11 R-FNA showed suspicious for malignancy.  Single atypical epithelial groups in a hypocellular specimen Station 7-FNA showed positive for malignancy.  Consistent with metastatic non-small cell carcinoma involving lymphoid material   05/29/2022 Cancer Staging   Staging form: Lung, AJCC 8th Edition - Clinical stage from 05/29/2022: Stage IV (cT3, cN2, cM1) - Signed by Rickard Patience, MD on 06/03/2022 Stage prefix: Initial diagnosis   06/22/2022 -  Chemotherapy   Cycle 1 LUNG Carboplatin + Pemetrexed + Keytruda D1 q21d Induction x 6 Cycles  Starting cycle 2, patient receives carboplatin and pemetrexed Cycle 3 and Cycle 4 patient elected to go to [facility] and received Carboplatin + Pemetrexed + Keytruda Cycle 5 Pemetrexed + Keytruda at [facility].  Cycle 6 pemetrexed + Keytruda      07/07/2022 Procedure   Second opinion at [facility].  Repeat biopsy of the right pleura.  Pathology is positive for metastatic adenocarcinoma.  Pleural fluid cytology was positive.  Patient had right pleural catheter placed.  [facility]GS at [facility] showed ERBB2 duplication, [name], NOTCH4, TP53   09/28/2022 Imaging   MRI brain w wo contrast 1.  Numerous (approximately 20) enhancing lesions with mixed interval change relative to 05/21/2022, as detailed above.  2.  No midline shift or hydrocephalus. No evidence of  leptomeningeal disease.    09/29/2022 Imaging   CT chest abdomen pelvis w contrast  Interval decrease in size of dominant right upper lobe mass.  Unchanged right anterior peridiaphragmatic lymph node suspicious for nodal metastases  Stable small right pleural effusion. Nodular right pleural thickening consistent with pleural metastasis  Increase in size of some of the pulmonary metastases    12/22/2022 Imaging   CT @ [facility] chest abdomen pelvis w contrast showed  Stable primary tumor in the right upper lobe. Stable pulmonary and right pleural metastases.  *  Stable right anterior diaphragmatic lymph nodes, likely metastatic.  *  Stable small hypodensities in the liver that may represent metastatic disease.     12/22/2022 Imaging   MRI brain w wo contrast @ [facility]  1.  Slight interval improvement in the six nonradiated metastases.  2.  Stable remainder of the nonradiated metastasis.  3.  No new or progressive intracranial metastases.      02/23/2023 Imaging   CT chest abdomen pelvis w contrast at [facility]   1. Unchanged primary lesion in the right upper lobe and stable suspected tumor infiltration of the right middle and right lower lobe included within rounded atelectasis. However some progression in the size of the lesions, is noted since 10/2022.   2. Stability versus mildly increased pleural metastatic infiltration in the lower right hemithorax. The size of the right pleural effusion with partial loculation is unchanged, small to moderate   3. Stable increased conspicuity of the numerous bilateral pulmonary metastases. These have significantly increased since 10/2022   4.  Nonspecific, however suspicious right anterior and posterior pericardiophrenic nodes and a few right-sided mediastinal nodes, are unchanged. No definite active nodal disease.   5. Progression of liver metastases.   6. At least two spinal lesions appear to have increased in size, concerning for metastatic disease      INTERVAL HISTORY Kurt Ewing is a 67 y.o. male who has above history reviewed by me today presents for follow up visit for metastatic lung non small cell lung cancer.    He reports feeling well.  No new complaints. 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    SURGICAL HISTORY: Past Surgical History:  Procedure Laterality Date   COLONOSCOPY     IR CV LINE INJECTION  10/16/2022   IR IMAGING GUIDED PORT INSERTION  06/19/2022   IR REMOVAL OF PLURAL CATH W/CUFF  07/24/2022   KNEE ARTHROSCOPY Right 2005   TONSILLECTOMY  1970   VIDEO BRONCHOSCOPY WITH ENDOBRONCHIAL ULTRASOUND N/A 05/29/2022   Procedure: VIDEO BRONCHOSCOPY WITH ENDOBRONCHIAL ULTRASOUND;  Surgeon: Vida Rigger, MD;  Location: [facility] ORS;  Service: Thoracic;  Laterality: N/A;    SOCIAL HISTORY: Social History   Socioeconomic History   Marital status: Married    Spouse name: Clydie Braun   Number of children: 3   Years of education: Not on file   Highest education level: Not on file  Occupational History   Not on file  Tobacco Use   Smoking status: Never   Smokeless tobacco: Never  Vaping Use   Vaping status: Never Used  Substance and Sexual Activity   Alcohol use: Yes    Comment: weekly   Drug use: Not Currently   Sexual activity: Not on file  Other Topics Concern   Not on file  Social History Narrative   Lives at home with wife Clydie Braun; 3 kids & 10 grandchildren    Social Determinants of Health   Financial Resource Strain: Not on file  Food Insecurity: No Food Insecurity (07/24/2022)   Hunger Vital Sign    Worried About Running Out of Food in the Last Year: Never true    Ran Out of Food in the Last Year: Never true  Transportation Needs: No Transportation Needs (07/24/2022)   PRAPARE - Administrator, Civil Service  (Medical): No    Lack of Transportation (Non-Medical): No  Physical Activity: Not on file  Stress: Not on file  Social Connections: Not on file  Intimate Partner Violence: Not At Risk (07/24/2022)   Humiliation, Afraid, Rape, and Kick questionnaire    Fear of Current or Ex-Partner: No    Emotionally Abused: No    Physically Abused: No    Sexually Abused: No    FAMILY HISTORY: Family History  Problem Relation Age of Onset   Hypertension Father    Heart disease Father    Hypertension Brother    COPD Brother     ALLERGIES:  has No Known Allergies.  MEDICATIONS:  Current Outpatient Medications  Medication Sig Dispense Refill   acetaminophen (TYLENOL) 500 MG tablet Take 500 mg by mouth every 6 (six) hours as needed for mild pain.     famotidine (PEPCID) 40 MG tablet Take 1 tablet by mouth at bedtime.     folic acid (FOLVITE) 1 MG tablet Take 1  tablet (1 mg total) by mouth daily. Start 7 days before pemetrexed chemotherapy. Continue until 21 days after pemetrexed completed. 100 tablet 3   lidocaine-prilocaine (EMLA) cream Apply 1 Application topically as needed. 30 g 2   melatonin 3 MG TABS tablet Take by mouth.     ondansetron (ZOFRAN) 8 MG tablet Take 8 mg by mouth every 8 (eight) hours as needed for nausea or vomiting.     No current facility-administered medications for this visit.   Facility-Administered Medications Ordered in Other Visits  Medication Dose Route Frequency Provider Last Rate Last Admin   heparin lock flush 100 unit/mL  500 Units Intracatheter Once PRN Rickard Patience, MD       sodium chloride flush (NS) 0.9 % injection 10 mL  10 mL Intravenous PRN Rickard Patience, MD   10 mL at 08/10/22 9147    Review of Systems  Constitutional:  Negative for appetite change, chills, fatigue, fever and unexpected weight change.  HENT:   Positive for hearing loss. Negative for voice change.        Tinnitus   Eyes:  Negative for eye problems and icterus.  Respiratory:  Negative for chest  tightness, cough and shortness of breath.   Cardiovascular:  Negative for chest pain and leg swelling.  Gastrointestinal:  Negative for abdominal distention and abdominal pain.  Endocrine: Negative for hot flashes.  Genitourinary:  Negative for difficulty urinating, dysuria and frequency.   Musculoskeletal:  Negative for arthralgias.  Skin:  Positive for itching. Negative for rash.  Neurological:  Negative for light-headedness and numbness.  Hematological:  Negative for adenopathy. Does not bruise/bleed easily.  Psychiatric/Behavioral:  Negative for confusion.      PHYSICAL EXAMINATION: ECOG PERFORMANCE STATUS: 0 - Asymptomatic  There were no vitals filed for this visit.  There were no vitals filed for this visit.   Physical Exam Constitutional:      General: He is not in acute distress.    Appearance: He is not diaphoretic.  HENT:     Head: Normocephalic and atraumatic.  Eyes:     General: No scleral icterus. Cardiovascular:     Rate and Rhythm: Normal rate.     Heart sounds: No murmur heard. Pulmonary:     Effort: Pulmonary effort is normal. No respiratory distress.     Comments: Decreased right lower lung breath sound Abdominal:     General: Bowel sounds are normal. There is no distension.     Palpations: Abdomen is soft.  Musculoskeletal:        General: Normal range of motion.     Cervical back: Normal range of motion.  Skin:    General: Skin is warm and dry.     Findings: No erythema.  Neurological:     Mental Status: He is alert and oriented to person, place, and time. Mental status is at baseline.     Cranial Nerves: No cranial nerve deficit.     Motor: No abnormal muscle tone.  Psychiatric:        Mood and Affect: Mood and affect normal.      LABORATORY DATA:  I have reviewed the data as listed    Latest Ref Rng & Units 02/10/2023   10:36 AM 01/14/2023    8:50 AM 12/03/2022   10:11 AM  CBC  WBC 4.0 - 10.5 K/uL 7.2  8.6  7.0   Hemoglobin 13.0 - 17.0  g/dL 82.9  56.2  13.0   Hematocrit 39.0 - 52.0 % 36.0  35.6  35.0   Platelets 150 - 400 K/uL 263  282  273       Latest Ref Rng & Units 02/10/2023   10:36 AM 01/14/2023    8:50 AM 12/03/2022   10:11 AM  CMP  Glucose 70 - 99 mg/dL 130  865  784   BUN 8 - 23 mg/dL 18  23  18    Creatinine 0.61 - 1.24 mg/dL 6.96  2.95  2.84   Sodium 135 - 145 mmol/L 137  136  137   Potassium 3.5 - 5.1 mmol/L 4.0  4.1  4.0   Chloride 98 - 111 mmol/L 107  102  106   CO2 22 - 32 mmol/L 25  23  25    Calcium 8.9 - 10.3 mg/dL 8.7  8.6  8.5   Total Protein 6.5 - 8.1 g/dL 6.9  7.1  6.8   Total Bilirubin 0.3 - 1.2 mg/dL 1.0  0.8  1.1   Alkaline Phos 38 - 126 U/L 92  91  81   AST 15 - 41 U/L 30  26  26    ALT 0 - 44 U/L 25  34  24      RADIOGRAPHIC STUDIES: I have personally reviewed the radiological images as listed and agreed with the findings in the report. No results found.

## 2023-03-03 NOTE — Assessment & Plan Note (Addendum)
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 Altan] . .  on maintenance Pemetrexed + Keytruda starting cycle 5.  He gets treatment alternatively with [facility] and [facility] cancer center.  I agree with [facility] to continuing current regimen given excellent tolerance and improvement in majority of disease.  CT scan/ MRI brain  done at [facility] showed slow progression disease. [facility] recommends to proceed with current treatment and repeat PET, MRI brain in 4 week.  Proceed with Pemetrexed + Keytruda today

## 2023-03-03 NOTE — Patient Instructions (Signed)
[facility] - A DEPT OF [facility]  Discharge Instructions: Thank you for choosing [facility] to provide your oncology and hematology care.  If you have a lab appointment with the Cancer Center, please go directly to the Cancer Center and check in at the registration area.  Wear comfortable clothing and clothing appropriate for easy access to any Portacath or PICC line.   We strive to give you quality time with your provider. You may need to reschedule your appointment if you arrive late (15 or more minutes).  Arriving late affects you and other patients whose appointments are after yours.  Also, if you miss three or more appointments without notifying the office, you may be dismissed from the clinic at the provider's discretion.      For prescription refill requests, have your pharmacy contact our office and allow 72 hours for refills to be completed.    To help prevent nausea and vomiting after your treatment, we encourage you to take your nausea medication as directed.  BELOW ARE SYMPTOMS THAT SHOULD BE REPORTED IMMEDIATELY: *FEVER GREATER THAN 100.4 F (38 C) OR HIGHER *CHILLS OR SWEATING *NAUSEA AND VOMITING THAT IS NOT CONTROLLED WITH YOUR NAUSEA MEDICATION *UNUSUAL SHORTNESS OF BREATH *UNUSUAL BRUISING OR BLEEDING *URINARY PROBLEMS (pain or burning when urinating, or frequent urination) *BOWEL PROBLEMS (unusual diarrhea, constipation, pain near the anus) TENDERNESS IN MOUTH AND THROAT WITH OR WITHOUT PRESENCE OF ULCERS (sore throat, sores in mouth, or a toothache) UNUSUAL RASH, SWELLING OR PAIN  UNUSUAL VAGINAL DISCHARGE OR ITCHING   Items with * indicate a potential emergency and should be followed up as soon as possible or go to the Emergency Department if any problems should occur.  Please show the CHEMOTHERAPY ALERT CARD or IMMUNOTHERAPY ALERT CARD at check-in to the Emergency Department and triage nurse.  Should you have  questions after your visit or need to cancel or reschedule your appointment, please contact [facility] - A DEPT OF [facility]  [phone] and follow the prompts.  Office hours are 8:00 a.m. to 4:30 p.m. Monday - Friday. Please note that voicemails left after 4:00 p.m. may not be returned until the following business day.  We are closed weekends and major holidays. You have access to a nurse at all times for urgent questions. Please call the main number to the clinic [phone] and follow the prompts.  For any non-urgent questions, you may also contact your provider using MyChart. We now offer e-Visits for anyone 57 and older to request care online for non-urgent symptoms. For details visit [email or web address].   Also download the MyChart app! Go to the app store, search "MyChart", open the app, select [place], and log in with your MyChart username and password.

## 2023-03-04 LAB — T4: T4, Total: 7.6 ug/dL (ref 4.5–12.0)

## 2023-03-09 ENCOUNTER — Ambulatory Visit: Payer: Medicare Other | Admitting: Oncology

## 2023-03-09 ENCOUNTER — Ambulatory Visit: Payer: Medicare Other

## 2023-03-09 ENCOUNTER — Other Ambulatory Visit: Payer: Medicare Other

## 2023-03-18 ENCOUNTER — Other Ambulatory Visit: Payer: Self-pay | Admitting: Oncology

## 2023-03-18 ENCOUNTER — Encounter: Payer: Self-pay | Admitting: *Deleted

## 2023-03-18 NOTE — Telephone Encounter (Signed)
Pt's last tx was on 11/6. per LOS: lab/MD/ chemo in 3 weeks, which would be 11/27.  He is scheduled for 12/3, can you update is to 11/27.

## 2023-03-19 ENCOUNTER — Encounter: Payer: Self-pay | Admitting: Oncology

## 2023-03-24 ENCOUNTER — Other Ambulatory Visit: Payer: Self-pay

## 2023-03-24 ENCOUNTER — Encounter: Payer: Self-pay | Admitting: Oncology

## 2023-03-24 ENCOUNTER — Inpatient Hospital Stay: Payer: Medicare Other

## 2023-03-24 ENCOUNTER — Inpatient Hospital Stay (HOSPITAL_BASED_OUTPATIENT_CLINIC_OR_DEPARTMENT_OTHER): Payer: Medicare Other | Admitting: Oncology

## 2023-03-24 VITALS — BP 121/81 | HR 63 | Temp 97.5°F | Resp 18 | Wt 183.0 lb

## 2023-03-24 DIAGNOSIS — C7931 Secondary malignant neoplasm of brain: Secondary | ICD-10-CM

## 2023-03-24 DIAGNOSIS — Z5111 Encounter for antineoplastic chemotherapy: Secondary | ICD-10-CM | POA: Diagnosis not present

## 2023-03-24 DIAGNOSIS — C3490 Malignant neoplasm of unspecified part of unspecified bronchus or lung: Secondary | ICD-10-CM

## 2023-03-24 DIAGNOSIS — Z5112 Encounter for antineoplastic immunotherapy: Secondary | ICD-10-CM | POA: Diagnosis not present

## 2023-03-24 LAB — CBC WITH DIFFERENTIAL/PLATELET
Abs Immature Granulocytes: 0.04 10*3/uL (ref 0.00–0.07)
Basophils Absolute: 0.1 10*3/uL (ref 0.0–0.1)
Basophils Relative: 1 %
Eosinophils Absolute: 0.3 10*3/uL (ref 0.0–0.5)
Eosinophils Relative: 4 %
HCT: 35.4 % — ABNORMAL LOW (ref 39.0–52.0)
Hemoglobin: 12 g/dL — ABNORMAL LOW (ref 13.0–17.0)
Immature Granulocytes: 1 %
Lymphocytes Relative: 12 %
Lymphs Abs: 1 10*3/uL (ref 0.7–4.0)
MCH: 33.3 pg (ref 26.0–34.0)
MCHC: 33.9 g/dL (ref 30.0–36.0)
MCV: 98.3 fL (ref 80.0–100.0)
Monocytes Absolute: 1.2 10*3/uL — ABNORMAL HIGH (ref 0.1–1.0)
Monocytes Relative: 14 %
Neutro Abs: 5.7 10*3/uL (ref 1.7–7.7)
Neutrophils Relative %: 68 %
Platelets: 234 10*3/uL (ref 150–400)
RBC: 3.6 MIL/uL — ABNORMAL LOW (ref 4.22–5.81)
RDW: 13.9 % (ref 11.5–15.5)
WBC: 8.3 10*3/uL (ref 4.0–10.5)
nRBC: 0 % (ref 0.0–0.2)

## 2023-03-24 LAB — COMPREHENSIVE METABOLIC PANEL
ALT: 27 U/L (ref 0–44)
AST: 31 U/L (ref 15–41)
Albumin: 3.4 g/dL — ABNORMAL LOW (ref 3.5–5.0)
Alkaline Phosphatase: 114 U/L (ref 38–126)
Anion gap: 7 (ref 5–15)
BUN: 20 mg/dL (ref 8–23)
CO2: 25 mmol/L (ref 22–32)
Calcium: 8.8 mg/dL — ABNORMAL LOW (ref 8.9–10.3)
Chloride: 106 mmol/L (ref 98–111)
Creatinine, Ser: 1.11 mg/dL (ref 0.61–1.24)
GFR, Estimated: >60 mL/min (ref >60)
Glucose, Bld: 89 mg/dL (ref 70–99)
Potassium: 4.1 mmol/L (ref 3.5–5.1)
Sodium: 138 mmol/L (ref 135–145)
Total Bilirubin: 1.3 mg/dL — ABNORMAL HIGH (ref <1.2)
Total Protein: 7.2 g/dL (ref 6.5–8.1)

## 2023-03-24 MED ORDER — CYANOCOBALAMIN 1000 MCG/ML IJ SOLN
1000.0000 ug | Freq: Once | INTRAMUSCULAR | Status: AC
  Administered 2023-03-24: 1000 ug via INTRAMUSCULAR
  Filled 2023-03-24: qty 1

## 2023-03-24 MED ORDER — SODIUM CHLORIDE 0.9 % IV SOLN
200.0000 mg | Freq: Once | INTRAVENOUS | Status: AC
  Administered 2023-03-24: 200 mg via INTRAVENOUS
  Filled 2023-03-24: qty 200

## 2023-03-24 MED ORDER — SODIUM CHLORIDE 0.9 % IV SOLN
Freq: Once | INTRAVENOUS | Status: AC
Start: 2023-03-24 — End: 2023-03-24
  Filled 2023-03-24: qty 250

## 2023-03-24 MED ORDER — DEXAMETHASONE SODIUM PHOSPHATE 10 MG/ML IJ SOLN
10.0000 mg | Freq: Once | INTRAMUSCULAR | Status: AC
  Administered 2023-03-24: 10 mg via INTRAVENOUS
  Filled 2023-03-24: qty 1

## 2023-03-24 MED ORDER — SODIUM CHLORIDE 0.9% FLUSH
10.0000 mL | INTRAVENOUS | Status: DC | PRN
  Filled 2023-03-24: qty 10

## 2023-03-24 MED ORDER — CYANOCOBALAMIN 1000 MCG/ML IJ SOLN: 1000.0000 ug | Freq: Once | INTRAMUSCULAR | Status: DC

## 2023-03-24 MED ORDER — ONDANSETRON HCL 4 MG/2ML IJ SOLN
8.0000 mg | Freq: Once | INTRAMUSCULAR | Status: AC
  Administered 2023-03-24: 8 mg via INTRAVENOUS
  Filled 2023-03-24: qty 4

## 2023-03-24 MED ORDER — SODIUM CHLORIDE 0.9 % IV SOLN
500.0000 mg/m2 | Freq: Once | INTRAVENOUS | Status: AC
  Administered 2023-03-24: 1100 mg via INTRAVENOUS
  Filled 2023-03-24: qty 4

## 2023-03-24 MED ORDER — HEPARIN SOD (PORK) LOCK FLUSH 100 UNIT/ML IV SOLN
500.0000 [IU] | Freq: Once | INTRAVENOUS | Status: AC | PRN
  Administered 2023-03-24: 500 [IU]
  Filled 2023-03-24: qty 5

## 2023-03-24 NOTE — Assessment & Plan Note (Signed)
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 Altan] . .  on maintenance Pemetrexed + Keytruda starting cycle 5.  He gets treatment alternatively with [facility] and [facility] cancer [facility].  I agree with [facility] to continuing current regimen given excellent tolerance and improvement in majority of disease.  CT scan/ MRI brain  done at [facility] showed slow progression disease. [facility] recommends to proceed with current treatment and repeat PET, MRI brain in early Dec.  Proceed with Pemetrexed + Keytruda today

## 2023-03-24 NOTE — Assessment & Plan Note (Signed)
Treatment plan as discussed above.

## 2023-03-24 NOTE — Progress Notes (Signed)
Hematology/Oncology Progress note [phone] [phone]       CHIEF COMPLAINTS/PURPOSE OF CONSULTATION:  Metastatic non-small cell lung cancer, brain metastasis.-ERBB duplication,    ASSESSMENT & PLAN:   Cancer Staging  Metastatic non-small cell lung cancer (HCC) Staging form: Lung, AJCC 8th Edition - Clinical stage from 05/29/2022: Stage IV (cT3, cN2, cM1) - Signed by Rickard Patience, MD on 06/03/2022    Metastatic non-small cell lung cancer (HCC) Stage IV non-small cell lung cancer  NGS on the second biopsy done at [facility] showed ERBB duplication [plan doing Tdxd as 2nd line, discussed with his MD Marijean Niemann Dr. Nicola Girt . .  on maintenance Pemetrexed + Keytruda starting cycle 5.  He gets treatment alternatively with [facility] and our cancer center.  I agree with [facility] to continuing current regimen given excellent tolerance and improvement in majority of disease.  CT scan/ MRI brain  done at [facility] showed slow progression disease. [facility] recommends to proceed with current treatment and repeat PET, MRI brain in early Dec.  Proceed with Pemetrexed + Keytruda today     Metastasis to brain [facility]) S/p  palliative radiation, MRI brain was done at [facility] showed treatment response.  Continue observation. He gets surveillance MRI brain at [facility] for antineoplastic chemotherapy Treatment plan as discussed above.    Follow-up  3 weeks lab [facility] chemotherapy.  All questions were answered. The patient knows to call the clinic with any problems, questions or concerns.  Rickard Patience, MD, PhD [facility] Hematology Oncology 03/24/2023   HISTORY OF PRESENTING ILLNESS:  Kurt Ewing 67 y.o. male presents to establish care for metastatic lung cancer.  Patient is a lifelong never smoker.  Oncology History  Metastatic non-small cell lung cancer (HCC)  04/30/2022 Imaging   CT chest w contrast showed  1. Perihilar right upper lobe masslike opacity measuring 3.4  x 5.6 x 3.8 cm, This abuts the major fissure 2. Moderate-sized right pleural effusion which is partially loculated tracks anteriorly the lower hemithorax. Possible occasional pleural enhancement and thickening posteriorly. 3. Rounded masslike opacity in the dependent right lower lobe measuring 5.8 x 4.1 cm 4. Additional bilateral pulmonary nodules, largest measuring 7 mm in the right upper lobe. 5. Prominent mediastinal and right hilar lymph nodes.  6   Low-density 2.4 cm right adrenal nodule. This is unchanged from 12/15/2019 abdominal CT in typical of adenoma   05/05/2022 Procedure   status post ultrasound-guided right thoracentesis.  Cytology showed suspicious for malignancy, background lymphocyte predominant effusion.    05/13/2022 Imaging   PET scan initial staging showed 1. 5.9 by 3.5 cm right hilar mass with maximum SUV 5.7, compatible with malignancy. The epicenter of this mass appears to be in the right upper lobe. Faintly increased activity in the right hilar lymph node which could be involved. 2. Large right pleural effusion with pleural thickening and subtle accentuated activity along the pleural surface without a well-defined masslike pleural lesion. Correlate with recent thoracocentesis results. 3. Low-grade activity in the collapsed right lower lobe, maximum SUV 3.2, probably inflammatory. 4. No findings of malignancy outside of the chest. 5. 2.4 cm right adrenal adenoma does not require follow up. 6. 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 Lumbar spondylosis and degenerative disc disease. 7. Aortic atherosclerosis.   05/21/2022 Imaging   MRI brain with and without contrast showed multiple peripherally enhancing lesions in the right middle cerebral hemispheres, concerning for metastatic disease.  05/29/2022 Initial Diagnosis   Metastatic non-small cell lung cancer (HCC)  02/25/22 patient was seen by PCP and was diagnosed with bronchitis, was  treated with Azithromycin.  Cough did not improve. He took levaquin for 10 days without improvement either.   05/29/2022 status post microscopy biopsy. Right upper lobe biopsy-non-small cell carcinoma, favor adenocarcinoma. Right lower lobe biopsy-nondiagnostic reactive bronchial cells Right upper lobe ENB assisted FNA, positive for malignancy.  Non-small cell carcinoma, favor adenocarcinoma. Right upper lobe lung ENB with depression-positive for malignancy, non-small cell carcinoma is present Station 10R -positive for malignancy consistent with metastatic non-small cell carcinoma involving lymphoid material. Station 11 R-FNA showed suspicious for malignancy.  Single atypical epithelial groups in a hypocellular specimen Station 7-FNA showed positive for malignancy.  Consistent with metastatic non-small cell carcinoma involving lymphoid material   05/29/2022 Cancer Staging   Staging form: Lung, AJCC 8th Edition - Clinical stage from 05/29/2022: Stage IV (cT3, cN2, cM1) - Signed by Rickard Patience, MD on 06/03/2022 Stage prefix: Initial diagnosis   06/22/2022 -  Chemotherapy   Cycle 1 LUNG Carboplatin + Pemetrexed + Keytruda D1 q21d Induction x 6 Cycles  Starting cycle 2, patient receives carboplatin and pemetrexed Cycle 3 and Cycle 4 patient elected to go to [facility] and received Carboplatin + Pemetrexed + Keytruda Cycle 5 Pemetrexed + Keytruda at [facility].  Cycle 6 pemetrexed + Keytruda      07/07/2022 Procedure   Second opinion at [facility].  Repeat biopsy of the right pleura.  Pathology is positive for metastatic adenocarcinoma.  Pleural fluid cytology was positive.  Patient had right pleural catheter placed.  NGS at [facility] showed ERBB2 duplication, [name], NOTCH4, TP53   09/28/2022 Imaging   MRI brain w wo contrast 1.  Numerous (approximately 20) enhancing lesions with mixed interval change relative to 05/21/2022, as detailed above.  2.  No midline shift or hydrocephalus. No  evidence of leptomeningeal disease.    09/29/2022 Imaging   CT chest abdomen pelvis w contrast  Interval decrease in size of dominant right upper lobe mass.  Unchanged right anterior peridiaphragmatic lymph node suspicious for nodal metastases  Stable small right pleural effusion. Nodular right pleural thickening consistent with pleural metastasis  Increase in size of some of the pulmonary metastases    12/22/2022 Imaging   CT @ [facility] chest abdomen pelvis w contrast showed  Stable primary tumor in the right upper lobe. Stable pulmonary and right pleural metastases.  *  Stable right anterior diaphragmatic lymph nodes, likely metastatic.  *  Stable small hypodensities in the liver that may represent metastatic disease.     12/22/2022 Imaging   MRI brain w wo contrast @ [facility]  1.  Slight interval improvement in the six nonradiated metastases.  2.  Stable remainder of the nonradiated metastasis.  3.  No new or progressive intracranial metastases.      02/23/2023 Imaging   CT chest abdomen pelvis w contrast at [facility]   1. Unchanged primary lesion in the right upper lobe and stable suspected tumor infiltration of the right middle and right lower lobe included within rounded atelectasis. However some progression in the size of the lesions, is noted since 10/2022.   2. Stability versus mildly increased pleural metastatic infiltration in the lower right hemithorax. The size of the right pleural effusion with partial loculation is unchanged, small to moderate   3. Stable increased conspicuity of the numerous bilateral pulmonary metastases. These have significantly increased since 10/2022  4. Nonspecific, however suspicious right anterior and posterior pericardiophrenic nodes and a few right-sided mediastinal nodes, are unchanged. No definite active nodal disease.   5. Progression of liver metastases.   6. 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 INTERVAL HISTORY Kurt Ewing is a 67 y.o. male who has above history reviewed by me today presents for follow up visit for metastatic lung non small cell lung cancer.    He reports feeling well.  No new complaints. He denies nausea vomiting diarrhea.    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    SURGICAL HISTORY: Past Surgical History:  Procedure Laterality Date   COLONOSCOPY     IR CV LINE INJECTION  10/16/2022   IR IMAGING GUIDED PORT INSERTION  06/19/2022   IR REMOVAL OF PLURAL CATH W/CUFF  07/24/2022   KNEE ARTHROSCOPY Right 2005   TONSILLECTOMY  1970   VIDEO BRONCHOSCOPY WITH ENDOBRONCHIAL ULTRASOUND N/A 05/29/2022   Procedure: VIDEO BRONCHOSCOPY WITH ENDOBRONCHIAL ULTRASOUND;  Surgeon: Vida Rigger, MD;  Location: [facility] ORS;  Service: Thoracic;  Laterality: N/A;    SOCIAL HISTORY: Social History   Socioeconomic History   Marital status: Married    Spouse name: Clydie Braun   Number of children: 3   Years of education: Not on file   Highest education level: Not on file  Occupational History   Not on file  Tobacco Use   Smoking status: Never   Smokeless tobacco: Never  Vaping Use   Vaping status: Never Used  Substance and Sexual Activity   Alcohol use: Yes    Comment: weekly   Drug use: Not Currently   Sexual activity: Not on file  Other Topics Concern   Not on file  Social History Narrative   Lives at home with wife Clydie Braun; 3 kids & 10 grandchildren    Social Determinants of Health   Financial Resource Strain: Not on file  Food Insecurity: No Food Insecurity (07/24/2022)   Hunger Vital Sign    Worried About Running Out of Food in the Last Year: Never true    Ran Out of Food in the Last Year: Never true  Transportation Needs: No Transportation Needs (07/24/2022)   PRAPARE -  Administrator, Civil Service (Medical): No    Lack of Transportation (Non-Medical): No  Physical Activity: Not on file  Stress: Not on file  Social Connections: Not on file  Intimate Partner Violence: Not At Risk (07/24/2022)   Humiliation, Afraid, Rape, and Kick questionnaire    Fear of Current or Ex-Partner: No    Emotionally Abused: No    Physically Abused: No    Sexually Abused: No    FAMILY HISTORY: Family History  Problem Relation Age of Onset   Hypertension Father    Heart disease Father    Hypertension Brother    COPD Brother     ALLERGIES:  has No Known Allergies.  MEDICATIONS:  Current Outpatient Medications  Medication Sig Dispense Refill   acetaminophen (TYLENOL) 500 MG tablet Take 500 mg by mouth every 6 (six) hours as needed for mild pain.     famotidine (PEPCID) 40 MG tablet Take 1 tablet by mouth at bedtime.  folic acid (FOLVITE) 1 MG tablet Take 1 tablet (1 mg total) by mouth daily. Start 7 days before pemetrexed chemotherapy. Continue until 21 days after pemetrexed completed. 100 tablet 3   lidocaine-prilocaine (EMLA) cream Apply 1 Application topically as needed. 30 g 2   lisinopril (ZESTRIL) 5 MG tablet Take 5 mg by mouth daily as needed.     melatonin 3 MG TABS tablet Take by mouth.     ondansetron (ZOFRAN) 8 MG tablet Take 8 mg by mouth every 8 (eight) hours as needed for nausea or vomiting.     No current facility-administered medications for this visit.   Facility-Administered Medications Ordered in Other Visits  Medication Dose Route Frequency Provider Last Rate Last Admin   heparin lock flush 100 unit/mL  500 Units Intracatheter Once PRN Rickard Patience, MD       sodium chloride flush (NS) 0.9 % injection 10 mL  10 mL Intravenous PRN Rickard Patience, MD   10 mL at 08/10/22 0917   sodium chloride flush (NS) 0.9 % injection 10 mL  10 mL Intracatheter PRN Rickard Patience, MD        Review of Systems  Constitutional:  Negative for appetite change, chills,  fatigue, fever and unexpected weight change.  HENT:   Positive for hearing loss. Negative for voice change.        Tinnitus   Eyes:  Negative for eye problems and icterus.  Respiratory:  Negative for chest tightness, cough and shortness of breath.   Cardiovascular:  Negative for chest pain and leg swelling.  Gastrointestinal:  Negative for abdominal distention and abdominal pain.  Endocrine: Negative for hot flashes.  Genitourinary:  Negative for difficulty urinating, dysuria and frequency.   Musculoskeletal:  Negative for arthralgias.  Skin:  Positive for itching. Negative for rash.  Neurological:  Negative for light-headedness and numbness.  Hematological:  Negative for adenopathy. Does not bruise/bleed easily.  Psychiatric/Behavioral:  Negative for confusion.      PHYSICAL EXAMINATION: ECOG PERFORMANCE STATUS: 0 - Asymptomatic  Vitals:   03/24/23 0843  BP: 121/81  Pulse: 63  Resp: 18  Temp: (!) 97.5 F (36.4 C)    Filed Weights   03/24/23 0843  Weight: 183 lb (83 kg)     Physical Exam Constitutional:      General: He is not in acute distress.    Appearance: He is not diaphoretic.  HENT:     Head: Normocephalic and atraumatic.  Eyes:     General: No scleral icterus. Cardiovascular:     Rate and Rhythm: Normal rate.     Heart sounds: No murmur heard. Pulmonary:     Effort: Pulmonary effort is normal. No respiratory distress.     Comments: Decreased right lower lung breath sound Abdominal:     General: Bowel sounds are normal. There is no distension.     Palpations: Abdomen is soft.  Musculoskeletal:        General: Normal range of motion.     Cervical back: Normal range of motion.  Skin:    General: Skin is warm and dry.     Findings: No erythema.  Neurological:     Mental Status: He is alert and oriented to person, place, and time. Mental status is at baseline.     Cranial Nerves: No cranial nerve deficit.     Motor: No abnormal muscle tone.   Psychiatric:        Mood and Affect: Mood and affect normal.  LABORATORY DATA:  I have reviewed the data as listed    Latest Ref Rng & Units 03/24/2023    8:33 AM 03/03/2023    8:24 AM 02/10/2023   10:36 AM  CBC  WBC 4.0 - 10.5 K/uL 8.3  7.0  7.2   Hemoglobin 13.0 - 17.0 g/dL 13.2  44.0  10.2   Hematocrit 39.0 - 52.0 % 35.4  34.0  36.0   Platelets 150 - 400 K/uL 234  282  263       Latest Ref Rng & Units 03/24/2023    8:33 AM 03/03/2023    8:24 AM 02/10/2023   10:36 AM  CMP  Glucose 70 - 99 mg/dL 89  96  725   BUN 8 - 23 mg/dL 20  17  18    Creatinine 0.61 - 1.24 mg/dL 3.66  4.40  3.47   Sodium 135 - 145 mmol/L 138  135  137   Potassium 3.5 - 5.1 mmol/L 4.1  4.0  4.0   Chloride 98 - 111 mmol/L 106  104  107   CO2 22 - 32 mmol/L 25  26  25    Calcium 8.9 - 10.3 mg/dL 8.8  8.8  8.7   Total Protein 6.5 - 8.1 g/dL 7.2  6.9  6.9   Total Bilirubin <1.2 mg/dL 1.3  1.4  1.0   Alkaline Phos 38 - 126 U/L 114  99  92   AST 15 - 41 U/L 31  27  30    ALT 0 - 44 U/L 27  22  25       RADIOGRAPHIC STUDIES: I have personally reviewed the radiological images as listed and agreed with the findings in the report. No results found.

## 2023-03-24 NOTE — Progress Notes (Signed)
Patient here for follow up. No new prot

## 2023-03-24 NOTE — Assessment & Plan Note (Signed)
S/p  palliative radiation, MRI brain was done at [facility] showed treatment response.  Continue observation. He gets surveillance [facility] brain at [facility]

## 2023-03-25 ENCOUNTER — Other Ambulatory Visit: Payer: Self-pay

## 2023-03-30 ENCOUNTER — Other Ambulatory Visit: Payer: Medicare Other

## 2023-03-30 ENCOUNTER — Ambulatory Visit: Payer: Medicare Other | Admitting: Oncology

## 2023-03-30 ENCOUNTER — Ambulatory Visit: Payer: Medicare Other

## 2023-04-01 ENCOUNTER — Other Ambulatory Visit: Payer: Self-pay

## 2023-04-02 ENCOUNTER — Other Ambulatory Visit: Payer: Self-pay

## 2023-04-14 ENCOUNTER — Inpatient Hospital Stay: Payer: Medicare Other | Attending: Oncology

## 2023-04-14 ENCOUNTER — Inpatient Hospital Stay: Payer: Medicare Other | Attending: Oncology | Admitting: Oncology

## 2023-04-14 ENCOUNTER — Inpatient Hospital Stay: Payer: Medicare Other

## 2023-04-14 ENCOUNTER — Encounter: Payer: Self-pay | Admitting: Oncology

## 2023-04-14 VITALS — BP 142/85 | HR 71 | Temp 97.9°F | Resp 18 | Wt 184.8 lb

## 2023-04-14 DIAGNOSIS — C7951 Secondary malignant neoplasm of bone: Secondary | ICD-10-CM | POA: Insufficient documentation

## 2023-04-14 DIAGNOSIS — C3490 Malignant neoplasm of unspecified part of unspecified bronchus or lung: Secondary | ICD-10-CM

## 2023-04-14 DIAGNOSIS — Z79620 Long term (current) use of immunosuppressive biologic: Secondary | ICD-10-CM | POA: Insufficient documentation

## 2023-04-14 DIAGNOSIS — C3411 Malignant neoplasm of upper lobe, right bronchus or lung: Secondary | ICD-10-CM | POA: Diagnosis not present

## 2023-04-14 DIAGNOSIS — C787 Secondary malignant neoplasm of liver and intrahepatic bile duct: Secondary | ICD-10-CM | POA: Insufficient documentation

## 2023-04-14 DIAGNOSIS — C7931 Secondary malignant neoplasm of brain: Secondary | ICD-10-CM | POA: Diagnosis not present

## 2023-04-14 DIAGNOSIS — C782 Secondary malignant neoplasm of pleura: Secondary | ICD-10-CM | POA: Diagnosis not present

## 2023-04-14 DIAGNOSIS — Z5111 Encounter for antineoplastic chemotherapy: Secondary | ICD-10-CM | POA: Diagnosis not present

## 2023-04-14 DIAGNOSIS — Z5112 Encounter for antineoplastic immunotherapy: Secondary | ICD-10-CM | POA: Diagnosis present

## 2023-04-14 LAB — COMPREHENSIVE METABOLIC PANEL
ALT: 31 U/L (ref 0–44)
AST: 31 U/L (ref 15–41)
Albumin: 3.2 g/dL — ABNORMAL LOW (ref 3.5–5.0)
Alkaline Phosphatase: 124 U/L (ref 38–126)
Anion gap: 7 (ref 5–15)
BUN: 17 mg/dL (ref 8–23)
CO2: 25 mmol/L (ref 22–32)
Calcium: 8.9 mg/dL (ref 8.9–10.3)
Chloride: 105 mmol/L (ref 98–111)
Creatinine, Ser: 1.12 mg/dL (ref 0.61–1.24)
GFR, Estimated: >60 mL/min (ref >60)
Glucose, Bld: 94 mg/dL (ref 70–99)
Potassium: 4.1 mmol/L (ref 3.5–5.1)
Sodium: 137 mmol/L (ref 135–145)
Total Bilirubin: 1.5 mg/dL — ABNORMAL HIGH (ref <1.2)
Total Protein: 6.9 g/dL (ref 6.5–8.1)

## 2023-04-14 LAB — CBC WITH DIFFERENTIAL/PLATELET
Abs Immature Granulocytes: 0.04 10*3/uL (ref 0.00–0.07)
Basophils Absolute: 0.1 10*3/uL (ref 0.0–0.1)
Basophils Relative: 1 %
Eosinophils Absolute: 0.4 10*3/uL (ref 0.0–0.5)
Eosinophils Relative: 5 %
HCT: 34.3 % — ABNORMAL LOW (ref 39.0–52.0)
Hemoglobin: 11.7 g/dL — ABNORMAL LOW (ref 13.0–17.0)
Immature Granulocytes: 1 %
Lymphocytes Relative: 11 %
Lymphs Abs: 0.9 10*3/uL (ref 0.7–4.0)
MCH: 33.7 pg (ref 26.0–34.0)
MCHC: 34.1 g/dL (ref 30.0–36.0)
MCV: 98.8 fL (ref 80.0–100.0)
Monocytes Absolute: 1.2 10*3/uL — ABNORMAL HIGH (ref 0.1–1.0)
Monocytes Relative: 14 %
Neutro Abs: 5.5 10*3/uL (ref 1.7–7.7)
Neutrophils Relative %: 68 %
Platelets: 267 10*3/uL (ref 150–400)
RBC: 3.47 MIL/uL — ABNORMAL LOW (ref 4.22–5.81)
RDW: 14 % (ref 11.5–15.5)
WBC: 8 10*3/uL (ref 4.0–10.5)
nRBC: 0 % (ref 0.0–0.2)

## 2023-04-14 MED ORDER — SODIUM CHLORIDE 0.9 % IV SOLN
500.0000 mg/m2 | Freq: Once | INTRAVENOUS | Status: AC
  Administered 2023-04-14: 1100 mg via INTRAVENOUS
  Filled 2023-04-14: qty 44

## 2023-04-14 MED ORDER — DEXAMETHASONE SODIUM PHOSPHATE 10 MG/ML IJ SOLN
10.0000 mg | Freq: Once | INTRAMUSCULAR | Status: AC
  Administered 2023-04-14: 10 mg via INTRAVENOUS
  Filled 2023-04-14: qty 1

## 2023-04-14 MED ORDER — ONDANSETRON HCL 4 MG/2ML IJ SOLN
8.0000 mg | Freq: Once | INTRAMUSCULAR | Status: AC
  Administered 2023-04-14: 8 mg via INTRAVENOUS
  Filled 2023-04-14: qty 4

## 2023-04-14 MED ORDER — SODIUM CHLORIDE 0.9% FLUSH
10.0000 mL | INTRAVENOUS | Status: DC | PRN
  Administered 2023-04-14: 10 mL
  Filled 2023-04-14: qty 10

## 2023-04-14 MED ORDER — SODIUM CHLORIDE 0.9 % IV SOLN
Freq: Once | INTRAVENOUS | Status: AC
Start: 2023-04-14 — End: 2023-04-14
  Filled 2023-04-14: qty 250

## 2023-04-14 MED ORDER — SODIUM CHLORIDE 0.9 % IV SOLN
200.0000 mg | Freq: Once | INTRAVENOUS | Status: AC
  Administered 2023-04-14: 200 mg via INTRAVENOUS
  Filled 2023-04-14: qty 8

## 2023-04-14 MED ORDER — HEPARIN SOD (PORK) LOCK FLUSH 100 UNIT/ML IV SOLN
500.0000 [IU] | Freq: Once | INTRAVENOUS | Status: AC | PRN
Start: 2023-04-14 — End: 2023-04-14
  Administered 2023-04-14: 500 [IU]
  Filled 2023-04-14: qty 5

## 2023-04-14 NOTE — Assessment & Plan Note (Signed)
S/p  palliative radiation, MRI brain was done at [facility] showed treatment response.  Continue observation. He gets surveillance MRI brain at [facility]

## 2023-04-14 NOTE — Progress Notes (Signed)
Pt here for follow up. No new problems.

## 2023-04-14 NOTE — Patient Instructions (Signed)

## 2023-04-14 NOTE — Progress Notes (Signed)
Hematology/Oncology Progress note [phone] [phone]       CHIEF COMPLAINTS/PURPOSE OF CONSULTATION:  Metastatic non-small cell lung cancer, brain metastasis.-ERBB duplication,    ASSESSMENT & PLAN:   Cancer Staging  Metastatic non-small cell lung cancer (HCC) Staging form: Lung, AJCC 8th Edition - Clinical stage from 05/29/2022: Stage IV (cT3, cN2, cM1) - Signed by Rickard Patience, MD on 06/03/2022    Metastatic non-small cell lung cancer (HCC) Stage IV non-small cell lung cancer  NGS on the second biopsy done at [facility] showed ERBB duplication [plan doing Tdxd as 2nd line, discussed with his MD Mari[facility] Niemann Dr. Nicola Girt . .  on maintenance Pemetrexed + Keytruda starting cycle 5.  He gets treatment alternatively with [facility] and [facility].  PET scan/ MRI brain  done at [facility] showed slow progression disease. [facility] recommends to continue current treatment and short term imaging in 6 weeks.   Proceed with Pemetrexed + Keytruda today     Encounter for antineoplastic chemotherapy Treatment plan as discussed above.   Metastasis to brain [facility]) S/p  palliative radiation, MRI brain was done at [facility] showed treatment response.  Continue observation. He gets surveillance MRI brain at [facility]   Follow-up  3 weeks lab [facility] chemotherapy.  All questions were answered. The patient knows to call the clinic with any problems, questions or concerns.  Rickard Patience, MD, PhD [facility] Hematology Oncology 04/14/2023   HISTORY OF PRESENTING ILLNESS:  Kurt Ewing 67 y.o. male presents to establish care for metastatic lung cancer.  Patient is a lifelong never smoker.  Oncology History  Metastatic non-small cell lung cancer (HCC)  04/30/2022 Imaging   CT chest w contrast showed  1. Perihilar right upper lobe masslike opacity measuring 3.4 x 5.6 x 3.8 cm, This abuts the major fissure 2. Moderate-sized right pleural effusion which is partially loculated tracks  anteriorly the lower hemithorax. Possible occasional pleural enhancement and thickening posteriorly. 3. Rounded masslike opacity in the dependent right lower lobe measuring 5.8 x 4.1 cm 4. Additional bilateral pulmonary nodules, largest measuring 7 mm in the right upper lobe. 5. Prominent mediastinal and right hilar lymph nodes.  6   Low-density 2.4 cm right adrenal nodule. This is unchanged from 12/15/2019 abdominal CT in typical of adenoma   05/05/2022 Procedure   status post ultrasound-guided right thoracentesis.  Cytology showed suspicious for malignancy, background lymphocyte predominant effusion.    05/13/2022 Imaging   PET scan initial staging showed 1. 5.9 by 3.5 cm right hilar mass with maximum SUV 5.7, compatible with malignancy. The epicenter of this mass appears to be in the right upper lobe. Faintly increased activity in the right hilar lymph node which could be involved. 2. Large right pleural effusion with pleural thickening and subtle accentuated activity along the pleural surface without a well-defined masslike pleural lesion. Correlate with recent thoracocentesis results. 3. Low-grade activity in the collapsed right lower lobe, maximum SUV 3.2, probably inflammatory. 4. No findings of malignancy outside of the chest. 5. 2.4 cm right adrenal adenoma does not require follow up. 6. 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 Lumbar spondylosis and degenerative disc disease. 7. Aortic atherosclerosis.   05/21/2022 Imaging   MRI brain with and without contrast showed multiple peripherally enhancing lesions in the right middle cerebral hemispheres, concerning for metastatic disease.   05/29/2022 Initial Diagnosis   Metastatic non-small cell lung cancer (HCC)  02/25/22 patient was seen by PCP  and was diagnosed with bronchitis, was treated with Azithromycin.  Cough did not improve. He took levaquin for 10 days without improvement either.   05/29/2022  status post microscopy biopsy. Right upper lobe biopsy-non-small cell carcinoma, favor adenocarcinoma. Right lower lobe biopsy-nondiagnostic reactive bronchial cells Right upper lobe ENB assisted FNA, positive for malignancy.  Non-small cell carcinoma, favor adenocarcinoma. Right upper lobe lung ENB with depression-positive for malignancy, non-small cell carcinoma is present Station 10R -positive for malignancy consistent with metastatic non-small cell carcinoma involving lymphoid material. Station 11 R-FNA showed suspicious for malignancy.  Single atypical epithelial groups in a hypocellular specimen Station 7-FNA showed positive for malignancy.  Consistent with metastatic non-small cell carcinoma involving lymphoid material   05/29/2022 Cancer Staging   Staging form: Lung, AJCC 8th Edition - Clinical stage from 05/29/2022: Stage IV (cT3, cN2, cM1) - Signed by Rickard Patience, MD on 06/03/2022 Stage prefix: Initial diagnosis   06/22/2022 -  Chemotherapy   Cycle 1 LUNG Carboplatin + Pemetrexed + Keytruda D1 q21d Induction x 6 Cycles  Starting cycle 2, patient receives carboplatin and pemetrexed Cycle 3 and Cycle 4 patient elected to go to [facility] and received Carboplatin + Pemetrexed + Keytruda Cycle 5 Pemetrexed + Keytruda at [facility].  Cycle 6 pemetrexed + Keytruda      07/07/2022 Procedure   Second opinion at [facility].  Repeat biopsy of the right pleura.  Pathology is positive for metastatic adenocarcinoma.  Pleural fluid cytology was positive.  Patient had right pleural catheter placed.  NGS at [facility] showed ERBB2 duplication, [name], NOTCH4, TP53   09/28/2022 Imaging   MRI brain w wo contrast 1.  Numerous (approximately 20) enhancing lesions with mixed interval change relative to 05/21/2022, as detailed above.  2.  No midline shift or hydrocephalus. No evidence of leptomeningeal disease.    09/29/2022 Imaging   CT chest abdomen pelvis w contrast  Interval decrease in size of  dominant right upper lobe mass.  Unchanged right anterior peridiaphragmatic lymph node suspicious for nodal metastases  Stable small right pleural effusion. Nodular right pleural thickening consistent with pleural metastasis  Increase in size of some of the pulmonary metastases    12/22/2022 Imaging   CT @ [facility] chest abdomen pelvis w contrast showed  Stable primary tumor in the right upper lobe. Stable pulmonary and right pleural metastases.  *  Stable right anterior diaphragmatic lymph nodes, likely metastatic.  *  Stable small hypodensities in the liver that may represent metastatic disease.     12/22/2022 Imaging   MRI brain w wo contrast @ [facility]  1.  Slight interval improvement in the six nonradiated metastases.  2.  Stable remainder of the nonradiated metastasis.  3.  No new or progressive intracranial metastases.      02/23/2023 Imaging   CT chest abdomen pelvis w contrast at [facility]   1. Unchanged primary lesion in the right upper lobe and stable suspected tumor infiltration of the right middle and right lower lobe included within rounded atelectasis. However some progression in the size of the lesions, is noted since 10/2022.   2. Stability versus mildly increased pleural metastatic infiltration in the lower right hemithorax. The size of the right pleural effusion with partial loculation is unchanged, small to moderate   3. Stable increased conspicuity of the numerous bilateral pulmonary metastases. These have significantly increased since 10/2022   4. Nonspecific, however suspicious right anterior and posterior pericardiophrenic nodes and a few right-sided mediastinal nodes, are unchanged.  No definite active nodal disease.   5. Progression of liver metastases.   6. At least two spinal lesions appear to have increased in size, concerning for metastatic disease    03/30/2023 Imaging   PET scan at [facility].  Residua of a treated primary right upper lobe lung malignancy is unchanged  in size and is FDG avid consistent with viable malignancy. There are also mass-like opacities in the middle lobe and right lower lobes that are unchanged and are consistent with atelectasis. However, there are focal regions of increased FDG uptake within these opacities with the differential including malignancy and inflammation. There are multiple lung nodules that are unchanged in size and consistent with metastases. There are FDG avid intrathoracic lymph nodes consistent with nodal metastasis that are unchanged in size. There are multiple FDG avid hepatic metastases that are suboptimally assessed anatomically although the visualized metastases are unchanged in size. There are multifocal FDG avid osseous metastases that show an increase in lytic destruction of bone. Brain metastases are not FDG avid.    03/30/2023 Imaging   MRI brain at [facility]  1.  No new lesions are seen since prior study.   2.  Non-radiated previously seen metastases are stable.   3.  Previously radiated lesions are stable to slightly decreased in size.   4.  No imaging evidence of leptomeningeal disease.       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 INTERVAL HISTORY LEART DUKE is a 67 y.o. male who has above history reviewed by me today presents for follow up visit for metastatic lung non small cell lung cancer.    He reports feeling well.  No new complaints. He denies nausea vomiting diarrhea.  + tinnitus, he plans to see ENT for evaluation.     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    SURGICAL HISTORY: Past Surgical History:  Procedure Laterality Date   COLONOSCOPY     IR CV LINE INJECTION  10/16/2022   IR IMAGING GUIDED PORT INSERTION  06/19/2022   IR REMOVAL OF PLURAL CATH W/CUFF  07/24/2022   KNEE ARTHROSCOPY Right 2005   TONSILLECTOMY  1970   VIDEO  BRONCHOSCOPY WITH ENDOBRONCHIAL ULTRASOUND N/A 05/29/2022   Procedure: VIDEO BRONCHOSCOPY WITH ENDOBRONCHIAL ULTRASOUND;  Surgeon: Vida Rigger, MD;  Location: [facility] ORS;  Service: Thoracic;  Laterality: N/A;    SOCIAL HISTORY: Social History   Socioeconomic History   Marital status: Married    Spouse name: Clydie Braun   Number of children: 3   Years of education: Not on file   Highest education level: Not on file  Occupational History   Not on file  Tobacco Use   Smoking status: Never   Smokeless tobacco: Never  Vaping Use   Vaping status: Never Used  Substance and Sexual Activity   Alcohol use: Yes    Comment: weekly   Drug use: Not Currently   Sexual activity: Not on file  Other Topics Concern   Not on file  Social History Narrative   Lives at home with wife Clydie Braun; 3 kids & 10 grandchildren    Social Drivers of Corporate investment banker Strain: Not on file  Food Insecurity: No Food Insecurity (07/24/2022)   Hunger Vital Sign    Worried About Running Out of Food in the Last Year:  Never true    Ran Out of Food in the Last Year: Never true  Transportation Needs: No Transportation Needs (07/24/2022)   PRAPARE - Administrator, Civil Service (Medical): No    Lack of Transportation (Non-Medical): No  Physical Activity: Not on file  Stress: Not on file  Social Connections: Not on file  Intimate Partner Violence: Not At Risk (07/24/2022)   Humiliation, Afraid, Rape, and Kick questionnaire    Fear of Current or Ex-Partner: No    Emotionally Abused: No    Physically Abused: No    Sexually Abused: No    FAMILY HISTORY: Family History  Problem Relation Age of Onset   Hypertension Father    Heart disease Father    Hypertension Brother    COPD Brother     ALLERGIES:  has no known allergies.  MEDICATIONS:  Current Outpatient Medications  Medication Sig Dispense Refill   acetaminophen (TYLENOL) 500 MG tablet Take 500 mg by mouth every 6 (six) hours as needed  for mild pain.     famotidine (PEPCID) 40 MG tablet Take 1 tablet by mouth at bedtime.     folic acid (FOLVITE) 1 MG tablet Take 1 tablet (1 mg total) by mouth daily. Start 7 days before pemetrexed chemotherapy. Continue until 21 days after pemetrexed completed. 100 tablet 3   lidocaine-prilocaine (EMLA) cream Apply 1 Application topically as needed. 30 g 2   lisinopril (ZESTRIL) 5 MG tablet Take 5 mg by mouth daily as needed.     melatonin 3 MG TABS tablet Take by mouth.     ondansetron (ZOFRAN) 8 MG tablet Take 8 mg by mouth every 8 (eight) hours as needed for nausea or vomiting.     No current facility-administered medications for this visit.   Facility-Administered Medications Ordered in Other Visits  Medication Dose Route Frequency Provider Last Rate Last Admin   heparin lock flush 100 unit/mL  500 Units Intracatheter Once PRN Rickard Patience, MD       sodium chloride flush (NS) 0.9 % injection 10 mL  10 mL Intravenous PRN Rickard Patience, MD   10 mL at 08/10/22 0917   sodium chloride flush (NS) 0.9 % injection 10 mL  10 mL Intracatheter PRN Rickard Patience, MD   10 mL at 04/14/23 1209    Review of Systems  Constitutional:  Negative for appetite change, chills, fatigue, fever and unexpected weight change.  HENT:   Positive for hearing loss. Negative for voice change.        Tinnitus   Eyes:  Negative for eye problems and icterus.  Respiratory:  Negative for chest tightness, cough and shortness of breath.   Cardiovascular:  Negative for chest pain and leg swelling.  Gastrointestinal:  Negative for abdominal distention and abdominal pain.  Endocrine: Negative for hot flashes.  Genitourinary:  Negative for difficulty urinating, dysuria and frequency.   Musculoskeletal:  Negative for arthralgias.  Skin:  Negative for itching and rash.  Neurological:  Negative for light-headedness and numbness.  Hematological:  Negative for adenopathy. Does not bruise/bleed easily.  Psychiatric/Behavioral:  Negative for  confusion.      PHYSICAL EXAMINATION: ECOG PERFORMANCE STATUS: 0 - Asymptomatic  Vitals:   04/14/23 0857  BP: (!) 142/85  Pulse: 71  Resp: 18  Temp: 97.9 F (36.6 C)    Filed Weights   04/14/23 0857  Weight: 184 lb 12.8 oz (83.8 kg)     Physical Exam Constitutional:      General:  He is not in acute distress.    Appearance: He is not diaphoretic.  HENT:     Head: Normocephalic and atraumatic.  Eyes:     General: No scleral icterus. Cardiovascular:     Rate and Rhythm: Normal rate.     Heart sounds: No murmur heard. Pulmonary:     Effort: Pulmonary effort is normal. No respiratory distress.     Comments: Decreased right lower lung breath sound Abdominal:     General: Bowel sounds are normal. There is no distension.     Palpations: Abdomen is soft.  Musculoskeletal:        General: Normal range of motion.     Cervical back: Normal range of motion.  Skin:    General: Skin is warm and dry.     Findings: No erythema.  Neurological:     Mental Status: He is alert and oriented to person, place, and time. Mental status is at baseline.     Cranial Nerves: No cranial nerve deficit.     Motor: No abnormal muscle tone.  Psychiatric:        Mood and Affect: Mood and affect normal.      LABORATORY DATA:  I have reviewed the data as listed    Latest Ref Rng & Units 04/14/2023    8:31 AM 03/24/2023    8:33 AM 03/03/2023    8:24 AM  CBC  WBC 4.0 - 10.5 K/uL 8.0  8.3  7.0   Hemoglobin 13.0 - 17.0 g/dL 16.1  09.6  04.5   Hematocrit 39.0 - 52.0 % 34.3  35.4  34.0   Platelets 150 - 400 K/uL 267  234  282       Latest Ref Rng & Units 04/14/2023    8:31 AM 03/24/2023    8:33 AM 03/03/2023    8:24 AM  CMP  Glucose 70 - 99 mg/dL 94  89  96   BUN 8 - 23 mg/dL 17  20  17    Creatinine 0.61 - 1.24 mg/dL 4.09  8.11  9.14   Sodium 135 - 145 mmol/L 137  138  135   Potassium 3.5 - 5.1 mmol/L 4.1  4.1  4.0   Chloride 98 - 111 mmol/L 105  106  104   CO2 22 - 32 mmol/L 25  25   26    Calcium 8.9 - 10.3 mg/dL 8.9  8.8  8.8   Total Protein 6.5 - 8.1 g/dL 6.9  7.2  6.9   Total Bilirubin <1.2 mg/dL 1.5  1.3  1.4   Alkaline Phos 38 - 126 U/L 124  114  99   AST 15 - 41 U/L 31  31  27    ALT 0 - 44 U/L 31  27  22       RADIOGRAPHIC STUDIES: I have personally reviewed the radiological images as listed and agreed with the findings in the report. No results found.

## 2023-04-14 NOTE — Assessment & Plan Note (Signed)
Treatment plan as discussed above.  

## 2023-04-14 NOTE — Assessment & Plan Note (Addendum)
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 Altan] . .  on maintenance Pemetrexed + Keytruda starting cycle 5.  He gets treatment alternatively with [facility] and [facility] cancer center.  PET scan/ MRI brain  done at [facility] showed slow progression disease. [facility] recommends to continue current treatment and short term imaging in 6 weeks.   Proceed with Pemetrexed + Keytruda today

## 2023-04-15 ENCOUNTER — Other Ambulatory Visit: Payer: Self-pay

## 2023-04-29 ENCOUNTER — Encounter: Payer: Self-pay | Admitting: Oncology

## 2023-05-05 ENCOUNTER — Inpatient Hospital Stay: Payer: No Typology Code available for payment source

## 2023-05-05 ENCOUNTER — Encounter: Payer: Self-pay | Admitting: Oncology

## 2023-05-05 ENCOUNTER — Inpatient Hospital Stay: Payer: No Typology Code available for payment source | Attending: Oncology

## 2023-05-05 ENCOUNTER — Inpatient Hospital Stay (HOSPITAL_BASED_OUTPATIENT_CLINIC_OR_DEPARTMENT_OTHER): Payer: No Typology Code available for payment source | Admitting: Oncology

## 2023-05-05 VITALS — BP 123/82 | HR 80 | Temp 97.0°F | Resp 18 | Wt 184.2 lb

## 2023-05-05 DIAGNOSIS — C7951 Secondary malignant neoplasm of bone: Secondary | ICD-10-CM | POA: Insufficient documentation

## 2023-05-05 DIAGNOSIS — C782 Secondary malignant neoplasm of pleura: Secondary | ICD-10-CM | POA: Insufficient documentation

## 2023-05-05 DIAGNOSIS — C7931 Secondary malignant neoplasm of brain: Secondary | ICD-10-CM | POA: Insufficient documentation

## 2023-05-05 DIAGNOSIS — Z79620 Long term (current) use of immunosuppressive biologic: Secondary | ICD-10-CM | POA: Insufficient documentation

## 2023-05-05 DIAGNOSIS — C3490 Malignant neoplasm of unspecified part of unspecified bronchus or lung: Secondary | ICD-10-CM | POA: Diagnosis not present

## 2023-05-05 DIAGNOSIS — C3411 Malignant neoplasm of upper lobe, right bronchus or lung: Secondary | ICD-10-CM | POA: Insufficient documentation

## 2023-05-05 DIAGNOSIS — Z5111 Encounter for antineoplastic chemotherapy: Secondary | ICD-10-CM | POA: Insufficient documentation

## 2023-05-05 DIAGNOSIS — C787 Secondary malignant neoplasm of liver and intrahepatic bile duct: Secondary | ICD-10-CM | POA: Insufficient documentation

## 2023-05-05 DIAGNOSIS — Z5112 Encounter for antineoplastic immunotherapy: Secondary | ICD-10-CM | POA: Insufficient documentation

## 2023-05-05 LAB — CBC WITH DIFFERENTIAL/PLATELET
Abs Immature Granulocytes: 0.06 10*3/uL (ref 0.00–0.07)
Basophils Absolute: 0.1 10*3/uL (ref 0.0–0.1)
Basophils Relative: 1 %
Eosinophils Absolute: 0.4 10*3/uL (ref 0.0–0.5)
Eosinophils Relative: 5 %
HCT: 34.2 % — ABNORMAL LOW (ref 39.0–52.0)
Hemoglobin: 11.6 g/dL — ABNORMAL LOW (ref 13.0–17.0)
Immature Granulocytes: 1 %
Lymphocytes Relative: 11 %
Lymphs Abs: 1 10*3/uL (ref 0.7–4.0)
MCH: 33.1 pg (ref 26.0–34.0)
MCHC: 33.9 g/dL (ref 30.0–36.0)
MCV: 97.7 fL (ref 80.0–100.0)
Monocytes Absolute: 1.1 10*3/uL — ABNORMAL HIGH (ref 0.1–1.0)
Monocytes Relative: 13 %
Neutro Abs: 5.8 10*3/uL (ref 1.7–7.7)
Neutrophils Relative %: 69 %
Platelets: 276 10*3/uL (ref 150–400)
RBC: 3.5 MIL/uL — ABNORMAL LOW (ref 4.22–5.81)
RDW: 13.9 % (ref 11.5–15.5)
WBC: 8.4 10*3/uL (ref 4.0–10.5)
nRBC: 0 % (ref 0.0–0.2)

## 2023-05-05 LAB — COMPREHENSIVE METABOLIC PANEL
ALT: 24 U/L (ref 0–44)
AST: 26 U/L (ref 15–41)
Albumin: 3.2 g/dL — ABNORMAL LOW (ref 3.5–5.0)
Alkaline Phosphatase: 135 U/L — ABNORMAL HIGH (ref 38–126)
Anion gap: 6 (ref 5–15)
BUN: 20 mg/dL (ref 8–23)
CO2: 26 mmol/L (ref 22–32)
Calcium: 9 mg/dL (ref 8.9–10.3)
Chloride: 105 mmol/L (ref 98–111)
Creatinine, Ser: 1.03 mg/dL (ref 0.61–1.24)
GFR, Estimated: >60 mL/min (ref >60)
Glucose, Bld: 94 mg/dL (ref 70–99)
Potassium: 4 mmol/L (ref 3.5–5.1)
Sodium: 137 mmol/L (ref 135–145)
Total Bilirubin: 1.1 mg/dL (ref 0.0–1.2)
Total Protein: 6.9 g/dL (ref 6.5–8.1)

## 2023-05-05 LAB — TSH: TSH: 2.236 u[IU]/mL (ref 0.350–4.500)

## 2023-05-05 MED ORDER — SODIUM CHLORIDE 0.9 % IV SOLN
500.0000 mg/m2 | Freq: Once | INTRAVENOUS | Status: AC
  Administered 2023-05-05: 1100 mg via INTRAVENOUS
  Filled 2023-05-05: qty 40

## 2023-05-05 MED ORDER — DEXAMETHASONE SODIUM PHOSPHATE 10 MG/ML IJ SOLN
10.0000 mg | Freq: Once | INTRAMUSCULAR | Status: AC
  Administered 2023-05-05: 10 mg via INTRAVENOUS
  Filled 2023-05-05: qty 1

## 2023-05-05 MED ORDER — HEPARIN SOD (PORK) LOCK FLUSH 100 UNIT/ML IV SOLN
500.0000 [IU] | Freq: Once | INTRAVENOUS | Status: AC | PRN
  Administered 2023-05-05: 500 [IU]
  Filled 2023-05-05: qty 5

## 2023-05-05 MED ORDER — ONDANSETRON HCL 4 MG/2ML IJ SOLN
8.0000 mg | Freq: Once | INTRAMUSCULAR | Status: AC
  Administered 2023-05-05: 8 mg via INTRAVENOUS
  Filled 2023-05-05: qty 4

## 2023-05-05 MED ORDER — SODIUM CHLORIDE 0.9 % IV SOLN
Freq: Once | INTRAVENOUS | Status: AC
  Filled 2023-05-05: qty 250

## 2023-05-05 MED ORDER — PEMBROLIZUMAB CHEMO INJECTION 100 MG/4ML
200.0000 mg | Freq: Once | INTRAVENOUS | Status: AC
  Administered 2023-05-05: 200 mg via INTRAVENOUS
  Filled 2023-05-05: qty 200

## 2023-05-05 NOTE — Assessment & Plan Note (Signed)
Treatment plan as discussed above.  

## 2023-05-05 NOTE — Assessment & Plan Note (Addendum)
 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 Altan] . .  on maintenance Pemetrexed  + Keytruda  starting cycle 5.  He gets treatment alternatively with [facility] and [facility] cancer center.  PET scan/ MRI brain  done at [facility] showed slow progression disease. [facility] recommends to continue current treatment and short term imaging in 6 weeks.   Proceed with Pemetrexed  + Keytruda  today  He will follow up with [facility], he was consented to start Phase 1 Selective Tyrosine Kinase Inhibitor NVL-330 clinical trial

## 2023-05-05 NOTE — Assessment & Plan Note (Signed)
S/p  palliative radiation, MRI brain was done at [facility] showed treatment response.  Continue observation. He gets surveillance MRI brain at [facility]

## 2023-05-05 NOTE — Patient Instructions (Signed)
 [facility] - A DEPT OF [facility]  Discharge Instructions: Thank you for choosing [facility] to provide your oncology and hematology care.  If you have a lab appointment with the Cancer Center, please go directly to the Cancer Center and check in at the registration area.  Wear comfortable clothing and clothing appropriate for easy access to any Portacath or PICC line.   We strive to give you quality time with your provider. You may need to reschedule your appointment if you arrive late (15 or more minutes).  Arriving late affects you and other patients whose appointments are after yours.  Also, if you miss three or more appointments without notifying the office, you may be dismissed from the clinic at the provider's discretion.      For prescription refill requests, have your pharmacy contact our office and allow 72 hours for refills to be completed.    Today you received the following chemotherapy and/or immunotherapy agents KEYTRUDA  and ALIMTA        To help prevent nausea and vomiting after your treatment, we encourage you to take your nausea medication as directed.  BELOW ARE SYMPTOMS THAT SHOULD BE REPORTED IMMEDIATELY: *FEVER GREATER THAN 100.4 F (38 C) OR HIGHER *CHILLS OR SWEATING *NAUSEA AND VOMITING THAT IS NOT CONTROLLED WITH YOUR NAUSEA MEDICATION *UNUSUAL SHORTNESS OF BREATH *UNUSUAL BRUISING OR BLEEDING *URINARY PROBLEMS (pain or burning when urinating, or frequent urination) *BOWEL PROBLEMS (unusual diarrhea, constipation, pain near the anus) TENDERNESS IN MOUTH AND THROAT WITH OR WITHOUT PRESENCE OF ULCERS (sore throat, sores in mouth, or a toothache) UNUSUAL RASH, SWELLING OR PAIN  UNUSUAL VAGINAL DISCHARGE OR ITCHING   Items with * indicate a potential emergency and should be followed up as soon as possible or go to the Emergency Department if any problems should occur.  Please show the CHEMOTHERAPY ALERT CARD or  IMMUNOTHERAPY ALERT CARD at check-in to the Emergency Department and triage nurse.  Should you have questions after your visit or need to cancel or reschedule your appointment, please contact [facility] - A DEPT OF [facility]  [phone] and follow the prompts.  Office hours are 8:00 a.m. to 4:30 p.m. Monday - Friday. Please note that voicemails left after 4:00 p.m. may not be returned until the following business day.  We are closed weekends and major holidays. You have access to a nurse at all times for urgent questions. Please call the main number to the clinic [phone] and follow the prompts.  For any non-urgent questions, you may also contact your provider using MyChart. We now offer e-Visits for anyone 64 and older to request care online for non-urgent symptoms. For details visit [email or web address].   Also download the MyChart app! Go to the app store, search MyChart, open the app, select [place], and log in with your MyChart username and password.  Pembrolizumab  Injection What is this medication? PEMBROLIZUMAB  (PEM broe LIZ ue mab) treats some types of cancer. It works by helping your immune system slow or stop the spread of cancer cells. It is a monoclonal antibody. This medicine may be used for other purposes; ask your health care provider or pharmacist if you have questions. COMMON BRAND NAME(S): Keytruda  What should I tell my care team before I take this medication? They need to know if you have any of these conditions: Allogeneic stem cell transplant (uses someone else's stem cells) Autoimmune diseases, such  as Crohn disease, ulcerative colitis, lupus History of chest radiation Nervous system problems, such as Guillain-Barre syndrome, myasthenia gravis Organ transplant An unusual or allergic reaction to pembrolizumab , other medications, foods, dyes, or preservatives Pregnant or trying to get pregnant Breast-feeding How should I  use this medication? This medication is injected into a vein. It is given by your care team in a hospital or clinic setting. A special MedGuide will be given to you before each treatment. Be sure to read this information carefully each time. Talk to your care team about the use of this medication in children. While it may be prescribed for children as young as 6 months for selected conditions, precautions do apply. Overdosage: If you think you have taken too much of this medicine contact a poison control center or emergency room at once. NOTE: This medicine is only for you. Do not share this medicine with others. What if I miss a dose? Keep appointments for follow-up doses. It is important not to miss your dose. Call your care team if you are unable to keep an appointment. What may interact with this medication? Interactions have not been studied. This list may not describe all possible interactions. Give your health care provider a list of all the medicines, herbs, non-prescription drugs, or dietary supplements you use. Also tell them if you smoke, drink alcohol, or use illegal drugs. Some items may interact with your medicine. What should I watch for while using this medication? Your condition will be monitored carefully while you are receiving this medication. You may need blood work while taking this medication. This medication may cause serious skin reactions. They can happen weeks to months after starting the medication. Contact your care team right away if you notice fevers or flu-like symptoms with a rash. The rash may be red or purple and then turn into blisters or peeling of the skin. You may also notice a red rash with swelling of the face, lips, or lymph nodes in your neck or under your arms. Tell your care team right away if you have any change in your eyesight. Talk to your care team if you may be pregnant. Serious birth defects can occur if you take this medication during pregnancy and  for 4 months after the last dose. You will need a negative pregnancy test before starting this medication. Contraception is recommended while taking this medication and for 4 months after the last dose. Your care team can help you find the option that works for you. Do not breastfeed while taking this medication and for 4 months after the last dose. What side effects may I notice from receiving this medication? Side effects that you should report to your care team as soon as possible: Allergic reactions--skin rash, itching, hives, swelling of the face, lips, tongue, or throat Dry cough, shortness of breath or trouble breathing Eye pain, redness, irritation, or discharge with blurry or decreased vision Heart muscle inflammation--unusual weakness or fatigue, shortness of breath, chest pain, fast or irregular heartbeat, dizziness, swelling of the ankles, feet, or hands Hormone gland problems--headache, sensitivity to light, unusual weakness or fatigue, dizziness, fast or irregular heartbeat, increased sensitivity to cold or heat, excessive sweating, constipation, hair loss, increased thirst or amount of urine, tremors or shaking, irritability Infusion reactions--chest pain, shortness of breath or trouble breathing, feeling faint or lightheaded Kidney injury (glomerulonephritis)--decrease in the amount of urine, red or dark brown urine, foamy or bubbly urine, swelling of the ankles, hands, or feet Liver injury--right upper  belly pain, loss of appetite, nausea, light-colored stool, dark yellow or brown urine, yellowing skin or eyes, unusual weakness or fatigue Pain, tingling, or numbness in the hands or feet, muscle weakness, change in vision, confusion or trouble speaking, loss of balance or coordination, trouble walking, seizures Rash, fever, and swollen lymph nodes Redness, blistering, peeling, or loosening of the skin, including inside the mouth Sudden or severe stomach pain, bloody diarrhea, fever,  nausea, vomiting Side effects that usually do not require medical attention (report to your care team if they continue or are bothersome): Bone, joint, or muscle pain Diarrhea Fatigue Loss of appetite Nausea Skin rash This list may not describe all possible side effects. Call your doctor for medical advice about side effects. You may report side effects to FDA at 1-800-FDA-1088. Where should I keep my medication? This medication is given in a hospital or clinic. It will not be stored at home. NOTE: This sheet is a summary. It may not cover all possible information. If you have questions about this medicine, talk to your doctor, pharmacist, or health care provider.  2024 Elsevier/Gold Standard (2021-08-26 00:00:00)  Pemetrexed  Injection What is this medication? PEMETREXED  (PEM e TREX ed) treats some types of cancer. It works by slowing down the growth of cancer cells. This medicine may be used for other purposes; ask your health care provider or pharmacist if you have questions. COMMON BRAND NAME(S): Alimta , PEMFEXY, PEMRYDI RTU What should I tell my care team before I take this medication? They need to know if you have any of these conditions: Infection, such as chickenpox, cold sores, or herpes Kidney disease Low blood cell levels (white cells, red cells, and platelets) Lung or breathing disease, such as asthma Radiation therapy An unusual or allergic reaction to pemetrexed , other medications, foods, dyes, or preservatives If you or your partner are pregnant or trying to get pregnant Breast-feeding How should I use this medication? This medication is injected into a vein. It is given by your care team in a hospital or clinic setting. Talk to your care team about the use of this medication in children. Special care may be needed. Overdosage: If you think you have taken too much of this medicine contact a poison control center or emergency room at once. NOTE: This medicine is only  for you. Do not share this medicine with others. What if I miss a dose? Keep appointments for follow-up doses. It is important not to miss your dose. Call your care team if you are unable to keep an appointment. What may interact with this medication? Do not take this medication with any of the following: Live virus vaccines This medication may also interact with the following: Ibuprofen This list may not describe all possible interactions. Give your health care provider a list of all the medicines, herbs, non-prescription drugs, or dietary supplements you use. Also tell them if you smoke, drink alcohol, or use illegal drugs. Some items may interact with your medicine. What should I watch for while using this medication? Your condition will be monitored carefully while you are receiving this medication. This medication may make you feel generally unwell. This is not uncommon as chemotherapy can affect healthy cells as well as cancer cells. Report any side effects. Continue your course of treatment even though you feel ill unless your care team tells you to stop. This medication can cause serious side effects. To reduce the risk, your care team may give you other medications to take before receiving this  one. Be sure to follow the directions from your care team. This medication can cause a rash or redness in areas of the body that have previously had radiation therapy. If you have had radiation therapy, tell your care team if you notice a rash in this area. This medication may increase your risk of getting an infection. Call your care team for advice if you get a fever, chills, sore throat, or other symptoms of a cold or flu. Do not treat yourself. Try to avoid being around people who are sick. Be careful brushing or flossing your teeth or using a toothpick because you may get an infection or bleed more easily. If you have any dental work done, tell your dentist you are receiving this  medication. Avoid taking medications that contain aspirin, acetaminophen , ibuprofen, naproxen, or ketoprofen unless instructed by your care team. These medications may hide a fever. Check with your care team if you have severe diarrhea, nausea, and vomiting, or if you sweat a lot. The loss of too much body fluid may make it dangerous for you to take this medication. Talk to your care team if you or your partner wish to become pregnant or think either of you might be pregnant. This medication can cause serious birth defects if taken during pregnancy and for 6 months after the last dose. A negative pregnancy test is required before starting this medication. A reliable form of contraception is recommended while taking this medication and for 6 months after the last dose. Talk to your care team about reliable forms of contraception. Do not father a child while taking this medication and for 3 months after the last dose. Use a condom while having sex during this time period. Do not breastfeed while taking this medication and for 1 week after the last dose. This medication may cause infertility. Talk to your care team if you are concerned about your fertility. What side effects may I notice from receiving this medication? Side effects that you should report to your care team as soon as possible: Allergic reactions--skin rash, itching, hives, swelling of the face, lips, tongue, or throat Dry cough, shortness of breath or trouble breathing Infection--fever, chills, cough, sore throat, wounds that don't heal, pain or trouble when passing urine, general feeling of discomfort or being unwell Kidney injury--decrease in the amount of urine, swelling of the ankles, hands, or feet Low red blood cell level--unusual weakness or fatigue, dizziness, headache, trouble breathing Redness, blistering, peeling, or loosening of the skin, including inside the mouth Unusual bruising or bleeding Side effects that usually do not  require medical attention (report to your care team if they continue or are bothersome): Fatigue Loss of appetite Nausea Vomiting This list may not describe all possible side effects. Call your doctor for medical advice about side effects. You may report side effects to FDA at 1-800-FDA-1088. Where should I keep my medication? This medication is given in a hospital or clinic. It will not be stored at home. NOTE: This sheet is a summary. It may not cover all possible information. If you have questions about this medicine, talk to your doctor, pharmacist, or health care provider.  2024 Elsevier/Gold Standard (2021-08-19 00:00:00)

## 2023-05-05 NOTE — Progress Notes (Signed)
 Patient here for follow up. Pt's wife reports that pt has been more forgetful.

## 2023-05-05 NOTE — Progress Notes (Signed)
 Hematology/Oncology Progress note [phone] [phone]       CHIEF COMPLAINTS/PURPOSE OF CONSULTATION:  Metastatic non-small cell lung cancer, brain metastasis.-ERBB duplication,    ASSESSMENT & PLAN:   Cancer Staging  Metastatic non-small cell lung cancer (HCC) Staging form: Lung, AJCC 8th Edition - Clinical stage from 05/29/2022: Stage IV (cT3, cN2, cM1) - Signed by Babara Call, MD on 06/03/2022    Metastatic non-small cell lung cancer (HCC) Stage IV non-small cell lung cancer  NGS on the second biopsy done at [facility] showed ERBB duplication [plan doing Tdxd as 2nd line, discussed with his [facility] Dr. Cathey . .  on maintenance Pemetrexed  + Keytruda  starting cycle 5.  He gets treatment alternatively with [facility] and our cancer center.  PET scan/ MRI brain  done at [facility] showed slow progression disease. [facility] recommends to continue current treatment and short term imaging in 6 weeks.   Proceed with Pemetrexed  + Keytruda  today  He will follow up with [facility], he was consented to start Phase 1 Selective Tyrosine Kinase Inhibitor NVL-330 clinical trial     Encounter for antineoplastic chemotherapy Treatment plan as discussed above.   Metastasis to brain [facility]) S/p  palliative radiation, MRI brain was done at [facility] showed treatment response.  Continue observation. He gets surveillance MRI brain at [facility]   Follow-up  To be determined.  All questions were answered. The patient knows to call the clinic with any problems, questions or concerns.  Call Babara, MD, PhD [facility] Hematology Oncology 05/05/2023   HISTORY OF PRESENTING ILLNESS:  Kurt Ewing 68 y.o. male presents to establish care for metastatic lung cancer.  Patient is a lifelong never smoker.  Oncology History  Metastatic non-small cell lung cancer (HCC)  04/30/2022 Imaging   CT chest w contrast showed  1. Perihilar right upper lobe masslike opacity measuring 3.4 x  5.6 x 3.8 cm, This abuts the major fissure 2. Moderate-sized right pleural effusion which is partially loculated tracks anteriorly the lower hemithorax. Possible occasional pleural enhancement and thickening posteriorly. 3. Rounded masslike opacity in the dependent right lower lobe measuring 5.8 x 4.1 cm 4. Additional bilateral pulmonary nodules, largest measuring 7 mm in the right upper lobe. 5. Prominent mediastinal and right hilar lymph nodes.  6   Low-density 2.4 cm right adrenal nodule. This is unchanged from 12/15/2019 abdominal CT in typical of adenoma   05/05/2022 Procedure   status post ultrasound-guided right thoracentesis.  Cytology showed suspicious for malignancy, background lymphocyte predominant effusion.    05/13/2022 Imaging   PET scan initial staging showed 1. 5.9 by 3.5 cm right hilar mass with maximum SUV 5.7, compatible with malignancy. The epicenter of this mass appears to be in the right upper lobe. Faintly increased activity in the right hilar lymph node which could be involved. 2. Large right pleural effusion with pleural thickening and subtle accentuated activity along the pleural surface without a well-defined masslike pleural lesion. Correlate with recent thoracocentesis results. 3. Low-grade activity in the collapsed right lower lobe, maximum SUV 3.2, probably inflammatory. 4. No findings of malignancy outside of the chest. 5. 2.4 cm right adrenal adenoma does not require follow up. 6. 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 Lumbar spondylosis and degenerative disc disease. 7. Aortic atherosclerosis.   05/21/2022 Imaging   MRI brain with and without contrast showed multiple peripherally enhancing lesions in the right middle cerebral hemispheres, concerning for metastatic disease.  05/29/2022 Initial Diagnosis   Metastatic non-small cell lung cancer (HCC)  02/25/22 patient was seen by PCP and was diagnosed with bronchitis, was  treated with Azithromycin.  Cough did not improve. He took levaquin  for 10 days without improvement either.   05/29/2022 status post microscopy biopsy. Right upper lobe biopsy-non-small cell carcinoma, favor adenocarcinoma. Right lower lobe biopsy-nondiagnostic reactive bronchial cells Right upper lobe ENB assisted FNA, positive for malignancy.  Non-small cell carcinoma, favor adenocarcinoma. Right upper lobe lung ENB with depression-positive for malignancy, non-small cell carcinoma is present Station 10R -positive for malignancy consistent with metastatic non-small cell carcinoma involving lymphoid material. Station 11 R-FNA showed suspicious for malignancy.  Single atypical epithelial groups in a hypocellular specimen Station 7-FNA showed positive for malignancy.  Consistent with metastatic non-small cell carcinoma involving lymphoid material   05/29/2022 Cancer Staging   Staging form: Lung, AJCC 8th Edition - Clinical stage from 05/29/2022: Stage IV (cT3, cN2, cM1) - Signed by Babara Call, MD on 06/03/2022 Stage prefix: Initial diagnosis   06/22/2022 -  Chemotherapy   Cycle 1 LUNG Carboplatin  + Pemetrexed  + Keytruda  D1 q21d Induction x 6 Cycles  Starting cycle 2, patient receives carboplatin  and pemetrexed  Cycle 3 and Cycle 4 patient elected to go to [facility] and received Carboplatin  + Pemetrexed  + Keytruda  Cycle 5 Pemetrexed  + Keytruda  at [facility].  Cycle 6 pemetrexed  + Keytruda       07/07/2022 Procedure   Second opinion at [facility].  Repeat biopsy of the right pleura.  Pathology is positive for metastatic adenocarcinoma.  Pleural fluid cytology was positive.  Patient had right pleural catheter placed.  NGS at [facility] showed ERBB2 duplication, GRIN2A, KEAp1, NOTCH4, TP53   09/28/2022 Imaging   MRI brain w wo contrast 1.  Numerous (approximately 20) enhancing lesions with mixed interval change relative to 05/21/2022, as detailed above.  2.  No midline shift or hydrocephalus. No  evidence of leptomeningeal disease.    09/29/2022 Imaging   CT chest abdomen pelvis w contrast  Interval decrease in size of dominant right upper lobe mass.  Unchanged right anterior peridiaphragmatic lymph node suspicious for nodal metastases  Stable small right pleural effusion. Nodular right pleural thickening consistent with pleural metastasis  Increase in size of some of the pulmonary metastases    12/22/2022 Imaging   CT @ [facility] chest abdomen pelvis w contrast showed  Stable primary tumor in the right upper lobe. Stable pulmonary and right pleural metastases.  *  Stable right anterior diaphragmatic lymph nodes, likely metastatic.  *  Stable small hypodensities in the liver that may represent metastatic disease.     12/22/2022 Imaging   MRI brain w wo contrast @ [facility]  1.  Slight interval improvement in the six nonradiated metastases.  2.  Stable remainder of the nonradiated metastasis.  3.  No new or progressive intracranial metastases.      02/23/2023 Imaging   CT chest abdomen pelvis w contrast at [facility]   1. Unchanged primary lesion in the right upper lobe and stable suspected tumor infiltration of the right middle and right lower lobe included within rounded atelectasis. However some progression in the size of the lesions, is noted since 10/2022.   2. Stability versus mildly increased pleural metastatic infiltration in the lower right hemithorax. The size of the right pleural effusion with partial loculation is unchanged, small to moderate   3. Stable increased conspicuity of the numerous bilateral pulmonary metastases. These have significantly increased since 10/2022  4. Nonspecific, however suspicious right anterior and posterior pericardiophrenic nodes and a few right-sided mediastinal nodes, are unchanged. No definite active nodal disease.   5. Progression of liver metastases.   6. At least two spinal lesions appear to have increased in size, concerning for metastatic disease     03/30/2023 Imaging   PET scan at [facility].  Residua of a treated primary right upper lobe lung malignancy is unchanged in size and is FDG avid consistent with viable malignancy. There are also mass-like opacities in the middle lobe and right lower lobes that are unchanged and are consistent with atelectasis. However, there are focal regions of increased FDG uptake within these opacities with the differential including malignancy and inflammation. There are multiple lung nodules that are unchanged in size and consistent with metastases. There are FDG avid intrathoracic lymph nodes consistent with nodal metastasis that are unchanged in size. There are multiple FDG avid hepatic metastases that are suboptimally assessed anatomically although the visualized metastases are unchanged in size. There are multifocal FDG avid osseous metastases that show an increase in lytic destruction of bone. Brain metastases are not FDG avid.    03/30/2023 Imaging   MRI brain at [facility]  1.  No new lesions are seen since prior study.   2.  Non-radiated previously seen metastases are stable.   3.  Previously radiated lesions are stable to slightly decreased in size.   4.  No imaging evidence of leptomeningeal disease.       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 INTERVAL HISTORY Kurt Ewing is a 68 y.o. male who has above history reviewed by me today presents for follow up visit for metastatic lung non small cell lung cancer.    He reports feeling well.  No new complaints. He denies nausea vomiting diarrhea.    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    SURGICAL HISTORY: Past Surgical History:  Procedure Laterality Date   COLONOSCOPY     IR CV LINE INJECTION  10/16/2022   IR IMAGING GUIDED PORT INSERTION  06/19/2022   IR REMOVAL OF PLURAL CATH W/CUFF   07/24/2022   KNEE ARTHROSCOPY Right 2005   TONSILLECTOMY  1970   VIDEO BRONCHOSCOPY WITH ENDOBRONCHIAL ULTRASOUND N/A 05/29/2022   Procedure: VIDEO BRONCHOSCOPY WITH ENDOBRONCHIAL ULTRASOUND;  Surgeon: Parris Manna, MD;  Location: [facility] ORS;  Service: Thoracic;  Laterality: N/A;    SOCIAL HISTORY: Social History   Socioeconomic History   Marital status: Married    Spouse name: Darice   Number of children: 3   Years of education: Not on file   Highest education level: Not on file  Occupational History   Not on file  Tobacco Use   Smoking status: Never   Smokeless tobacco: Never  Vaping Use   Vaping status: Never Used  Substance and Sexual Activity   Alcohol use: Yes    Comment: weekly   Drug use: Not Currently   Sexual activity: Not on file  Other Topics Concern   Not on file  Social History Narrative   Lives at home with wife Darice; 3 kids & 10 grandchildren    Social Drivers of Corporate Investment Banker Strain: Not on file  Food Insecurity: No Food Insecurity (07/24/2022)   Hunger Vital Sign    Worried About Running  Out of Food in the Last Year: Never true    Ran Out of Food in the Last Year: Never true  Transportation Needs: No Transportation Needs (07/24/2022)   PRAPARE - Administrator, Civil Service (Medical): No    Lack of Transportation (Non-Medical): No  Physical Activity: Not on file  Stress: Not on file  Social Connections: Not on file  Intimate Partner Violence: Not At Risk (07/24/2022)   Humiliation, Afraid, Rape, and Kick questionnaire    Fear of Current or Ex-Partner: No    Emotionally Abused: No    Physically Abused: No    Sexually Abused: No    FAMILY HISTORY: Family History  Problem Relation Age of Onset   Hypertension Father    Heart disease Father    Hypertension Brother    COPD Brother     ALLERGIES:  has no known allergies.  MEDICATIONS:  Current Outpatient Medications  Medication Sig Dispense Refill   acetaminophen   (TYLENOL ) 500 MG tablet Take 500 mg by mouth every 6 (six) hours as needed for mild pain.     famotidine (PEPCID) 40 MG tablet Take 1 tablet by mouth at bedtime.     folic acid  (FOLVITE ) 1 MG tablet Take 1 tablet (1 mg total) by mouth daily. Start 7 days before pemetrexed  chemotherapy. Continue until 21 days after pemetrexed  completed. 100 tablet 3   lidocaine -prilocaine  (EMLA ) cream Apply 1 Application topically as needed. 30 g 2   lisinopril  (ZESTRIL ) 5 MG tablet Take 5 mg by mouth daily as needed.     melatonin 3 MG TABS tablet Take by mouth.     ondansetron  (ZOFRAN ) 8 MG tablet Take 8 mg by mouth every 8 (eight) hours as needed for nausea or vomiting.     No current facility-administered medications for this visit.   Facility-Administered Medications Ordered in Other Visits  Medication Dose Route Frequency Provider Last Rate Last Admin   heparin  lock flush 100 unit/mL  500 Units Intracatheter Once PRN Babara Call, MD       sodium chloride  flush (NS) 0.9 % injection 10 mL  10 mL Intravenous PRN Babara Call, MD   10 mL at 08/10/22 9082    Review of Systems  Constitutional:  Negative for appetite change, chills, fatigue, fever and unexpected weight change.  HENT:   Positive for hearing loss. Negative for voice change.        Tinnitus   Eyes:  Negative for eye problems and icterus.  Respiratory:  Negative for chest tightness, cough and shortness of breath.   Cardiovascular:  Negative for chest pain and leg swelling.  Gastrointestinal:  Negative for abdominal distention and abdominal pain.  Endocrine: Negative for hot flashes.  Genitourinary:  Negative for difficulty urinating, dysuria and frequency.   Musculoskeletal:  Negative for arthralgias.  Skin:  Negative for itching and rash.  Neurological:  Negative for light-headedness and numbness.  Hematological:  Negative for adenopathy. Does not bruise/bleed easily.  Psychiatric/Behavioral:  Negative for confusion.      PHYSICAL  EXAMINATION: ECOG PERFORMANCE STATUS: 0 - Asymptomatic  Vitals:   05/05/23 0906  BP: 123/82  Pulse: 80  Resp: 18  Temp: (!) 97 F (36.1 C)  SpO2: 97%    Filed Weights   05/05/23 0906  Weight: 184 lb 3.2 oz (83.6 kg)     Physical Exam Constitutional:      General: He is not in acute distress.    Appearance: He is not diaphoretic.  HENT:  Head: Normocephalic and atraumatic.  Eyes:     General: No scleral icterus. Cardiovascular:     Rate and Rhythm: Normal rate.     Heart sounds: No murmur heard. Pulmonary:     Effort: Pulmonary effort is normal. No respiratory distress.     Comments: Decreased right lower lung breath sound Abdominal:     General: Bowel sounds are normal. There is no distension.     Palpations: Abdomen is soft.  Musculoskeletal:        General: Normal range of motion.     Cervical back: Normal range of motion.  Skin:    General: Skin is warm and dry.     Findings: No erythema.  Neurological:     Mental Status: He is alert and oriented to person, place, and time. Mental status is at baseline.     Cranial Nerves: No cranial nerve deficit.     Motor: No abnormal muscle tone.  Psychiatric:        Mood and Affect: Mood and affect normal.      LABORATORY DATA:  I have reviewed the data as listed    Latest Ref Rng & Units 05/05/2023    8:48 AM 04/14/2023    8:31 AM 03/24/2023    8:33 AM  CBC  WBC 4.0 - 10.5 K/uL 8.4  8.0  8.3   Hemoglobin 13.0 - 17.0 g/dL 88.3  88.2  87.9   Hematocrit 39.0 - 52.0 % 34.2  34.3  35.4   Platelets 150 - 400 K/uL 276  267  234       Latest Ref Rng & Units 05/05/2023    8:48 AM 04/14/2023    8:31 AM 03/24/2023    8:33 AM  CMP  Glucose 70 - 99 mg/dL 94  94  89   BUN 8 - 23 mg/dL 20  17  20    Creatinine 0.61 - 1.24 mg/dL 8.96  8.87  8.88   Sodium 135 - 145 mmol/L 137  137  138   Potassium 3.5 - 5.1 mmol/L 4.0  4.1  4.1   Chloride 98 - 111 mmol/L 105  105  106   CO2 22 - 32 mmol/L 26  25  25    Calcium  8.9 -  10.3 mg/dL 9.0  8.9  8.8   Total Protein 6.5 - 8.1 g/dL 6.9  6.9  7.2   Total Bilirubin 0.0 - 1.2 mg/dL 1.1  1.5  1.3   Alkaline Phos 38 - 126 U/L 135  124  114   AST 15 - 41 U/L 26  31  31    ALT 0 - 44 U/L 24  31  27       RADIOGRAPHIC STUDIES: I have personally reviewed the radiological images as listed and agreed with the findings in the report. No results found.

## 2023-05-06 LAB — T4: T4, Total: 8.2 ug/dL (ref 4.5–12.0)

## 2023-05-07 ENCOUNTER — Other Ambulatory Visit: Payer: Self-pay

## 2023-05-11 DIAGNOSIS — C7801 Secondary malignant neoplasm of right lung: Secondary | ICD-10-CM | POA: Diagnosis not present

## 2023-05-11 DIAGNOSIS — C7802 Secondary malignant neoplasm of left lung: Secondary | ICD-10-CM | POA: Diagnosis not present

## 2023-05-11 DIAGNOSIS — C787 Secondary malignant neoplasm of liver and intrahepatic bile duct: Secondary | ICD-10-CM | POA: Diagnosis not present

## 2023-05-11 DIAGNOSIS — J9811 Atelectasis: Secondary | ICD-10-CM | POA: Diagnosis not present

## 2023-05-11 DIAGNOSIS — Z79899 Other long term (current) drug therapy: Secondary | ICD-10-CM | POA: Diagnosis not present

## 2023-05-11 DIAGNOSIS — J90 Pleural effusion, not elsewhere classified: Secondary | ICD-10-CM | POA: Diagnosis not present

## 2023-05-11 DIAGNOSIS — R590 Localized enlarged lymph nodes: Secondary | ICD-10-CM | POA: Diagnosis not present

## 2023-05-11 DIAGNOSIS — C3411 Malignant neoplasm of upper lobe, right bronchus or lung: Secondary | ICD-10-CM | POA: Diagnosis not present

## 2023-05-11 DIAGNOSIS — C7951 Secondary malignant neoplasm of bone: Secondary | ICD-10-CM | POA: Diagnosis not present

## 2023-05-12 DIAGNOSIS — C7802 Secondary malignant neoplasm of left lung: Secondary | ICD-10-CM | POA: Diagnosis not present

## 2023-05-12 DIAGNOSIS — C7951 Secondary malignant neoplasm of bone: Secondary | ICD-10-CM | POA: Diagnosis not present

## 2023-05-12 DIAGNOSIS — R590 Localized enlarged lymph nodes: Secondary | ICD-10-CM | POA: Diagnosis not present

## 2023-05-12 DIAGNOSIS — C3491 Malignant neoplasm of unspecified part of right bronchus or lung: Secondary | ICD-10-CM | POA: Diagnosis not present

## 2023-05-12 DIAGNOSIS — C3411 Malignant neoplasm of upper lobe, right bronchus or lung: Secondary | ICD-10-CM | POA: Diagnosis not present

## 2023-05-12 DIAGNOSIS — C7931 Secondary malignant neoplasm of brain: Secondary | ICD-10-CM | POA: Diagnosis not present

## 2023-05-12 DIAGNOSIS — J9811 Atelectasis: Secondary | ICD-10-CM | POA: Diagnosis not present

## 2023-05-12 DIAGNOSIS — J90 Pleural effusion, not elsewhere classified: Secondary | ICD-10-CM | POA: Diagnosis not present

## 2023-05-12 DIAGNOSIS — C787 Secondary malignant neoplasm of liver and intrahepatic bile duct: Secondary | ICD-10-CM | POA: Diagnosis not present

## 2023-05-12 DIAGNOSIS — C7801 Secondary malignant neoplasm of right lung: Secondary | ICD-10-CM | POA: Diagnosis not present

## 2023-05-23 ENCOUNTER — Encounter: Payer: Self-pay | Admitting: Oncology

## 2023-05-24 NOTE — Telephone Encounter (Signed)
FYI- pt was TBD, no appts scheduled.

## 2023-05-25 ENCOUNTER — Other Ambulatory Visit: Payer: Self-pay

## 2023-05-26 DIAGNOSIS — C3491 Malignant neoplasm of unspecified part of right bronchus or lung: Secondary | ICD-10-CM | POA: Diagnosis not present

## 2023-06-08 ENCOUNTER — Other Ambulatory Visit: Payer: Self-pay

## 2023-06-09 DIAGNOSIS — I700 Atherosclerosis of aorta: Secondary | ICD-10-CM | POA: Diagnosis not present

## 2023-06-09 DIAGNOSIS — C3411 Malignant neoplasm of upper lobe, right bronchus or lung: Secondary | ICD-10-CM | POA: Diagnosis not present

## 2023-06-09 DIAGNOSIS — C771 Secondary and unspecified malignant neoplasm of intrathoracic lymph nodes: Secondary | ICD-10-CM | POA: Diagnosis not present

## 2023-06-09 DIAGNOSIS — C7802 Secondary malignant neoplasm of left lung: Secondary | ICD-10-CM | POA: Diagnosis not present

## 2023-06-09 DIAGNOSIS — C7801 Secondary malignant neoplasm of right lung: Secondary | ICD-10-CM | POA: Diagnosis not present

## 2023-06-09 DIAGNOSIS — M47816 Spondylosis without myelopathy or radiculopathy, lumbar region: Secondary | ICD-10-CM | POA: Diagnosis not present

## 2023-06-09 DIAGNOSIS — Z8614 Personal history of Methicillin resistant Staphylococcus aureus infection: Secondary | ICD-10-CM | POA: Diagnosis not present

## 2023-06-09 DIAGNOSIS — C7931 Secondary malignant neoplasm of brain: Secondary | ICD-10-CM | POA: Diagnosis not present

## 2023-06-09 DIAGNOSIS — Z01818 Encounter for other preprocedural examination: Secondary | ICD-10-CM | POA: Diagnosis not present

## 2023-06-09 DIAGNOSIS — Z79899 Other long term (current) drug therapy: Secondary | ICD-10-CM | POA: Diagnosis not present

## 2023-06-09 DIAGNOSIS — Z79631 Long term (current) use of antimetabolite agent: Secondary | ICD-10-CM | POA: Diagnosis not present

## 2023-06-09 DIAGNOSIS — C7951 Secondary malignant neoplasm of bone: Secondary | ICD-10-CM | POA: Diagnosis not present

## 2023-06-09 DIAGNOSIS — I10 Essential (primary) hypertension: Secondary | ICD-10-CM | POA: Diagnosis not present

## 2023-06-09 DIAGNOSIS — C787 Secondary malignant neoplasm of liver and intrahepatic bile duct: Secondary | ICD-10-CM | POA: Diagnosis not present

## 2023-06-10 DIAGNOSIS — I700 Atherosclerosis of aorta: Secondary | ICD-10-CM | POA: Diagnosis not present

## 2023-06-10 DIAGNOSIS — J910 Malignant pleural effusion: Secondary | ICD-10-CM | POA: Diagnosis not present

## 2023-06-10 DIAGNOSIS — C7801 Secondary malignant neoplasm of right lung: Secondary | ICD-10-CM | POA: Diagnosis not present

## 2023-06-10 DIAGNOSIS — M47816 Spondylosis without myelopathy or radiculopathy, lumbar region: Secondary | ICD-10-CM | POA: Diagnosis not present

## 2023-06-10 DIAGNOSIS — Z8614 Personal history of Methicillin resistant Staphylococcus aureus infection: Secondary | ICD-10-CM | POA: Diagnosis not present

## 2023-06-10 DIAGNOSIS — C7802 Secondary malignant neoplasm of left lung: Secondary | ICD-10-CM | POA: Diagnosis not present

## 2023-06-10 DIAGNOSIS — C7931 Secondary malignant neoplasm of brain: Secondary | ICD-10-CM | POA: Diagnosis not present

## 2023-06-10 DIAGNOSIS — Z79899 Other long term (current) drug therapy: Secondary | ICD-10-CM | POA: Diagnosis not present

## 2023-06-10 DIAGNOSIS — I10 Essential (primary) hypertension: Secondary | ICD-10-CM | POA: Diagnosis not present

## 2023-06-10 DIAGNOSIS — Z79631 Long term (current) use of antimetabolite agent: Secondary | ICD-10-CM | POA: Diagnosis not present

## 2023-06-10 DIAGNOSIS — C3411 Malignant neoplasm of upper lobe, right bronchus or lung: Secondary | ICD-10-CM | POA: Diagnosis not present

## 2023-06-10 DIAGNOSIS — C771 Secondary and unspecified malignant neoplasm of intrathoracic lymph nodes: Secondary | ICD-10-CM | POA: Diagnosis not present

## 2023-06-10 DIAGNOSIS — Z923 Personal history of irradiation: Secondary | ICD-10-CM | POA: Diagnosis not present

## 2023-06-10 DIAGNOSIS — C787 Secondary malignant neoplasm of liver and intrahepatic bile duct: Secondary | ICD-10-CM | POA: Diagnosis not present

## 2023-06-10 DIAGNOSIS — C7951 Secondary malignant neoplasm of bone: Secondary | ICD-10-CM | POA: Diagnosis not present

## 2023-06-11 DIAGNOSIS — C3491 Malignant neoplasm of unspecified part of right bronchus or lung: Secondary | ICD-10-CM | POA: Diagnosis not present

## 2023-06-16 DIAGNOSIS — C3491 Malignant neoplasm of unspecified part of right bronchus or lung: Secondary | ICD-10-CM | POA: Diagnosis not present

## 2023-06-23 DIAGNOSIS — C3491 Malignant neoplasm of unspecified part of right bronchus or lung: Secondary | ICD-10-CM | POA: Diagnosis not present

## 2023-06-23 DIAGNOSIS — Z01818 Encounter for other preprocedural examination: Secondary | ICD-10-CM | POA: Diagnosis not present

## 2023-07-07 DIAGNOSIS — C782 Secondary malignant neoplasm of pleura: Secondary | ICD-10-CM | POA: Diagnosis not present

## 2023-07-07 DIAGNOSIS — Z923 Personal history of irradiation: Secondary | ICD-10-CM | POA: Diagnosis not present

## 2023-07-07 DIAGNOSIS — Z9221 Personal history of antineoplastic chemotherapy: Secondary | ICD-10-CM | POA: Diagnosis not present

## 2023-07-07 DIAGNOSIS — C771 Secondary and unspecified malignant neoplasm of intrathoracic lymph nodes: Secondary | ICD-10-CM | POA: Diagnosis not present

## 2023-07-07 DIAGNOSIS — C3411 Malignant neoplasm of upper lobe, right bronchus or lung: Secondary | ICD-10-CM | POA: Diagnosis not present

## 2023-07-07 DIAGNOSIS — Z7952 Long term (current) use of systemic steroids: Secondary | ICD-10-CM | POA: Diagnosis not present

## 2023-07-07 DIAGNOSIS — Z9226 Personal history of immune checkpoint inhibitor therapy: Secondary | ICD-10-CM | POA: Diagnosis not present

## 2023-07-07 DIAGNOSIS — E875 Hyperkalemia: Secondary | ICD-10-CM | POA: Diagnosis not present

## 2023-07-07 DIAGNOSIS — I10 Essential (primary) hypertension: Secondary | ICD-10-CM | POA: Diagnosis not present

## 2023-07-07 DIAGNOSIS — C7931 Secondary malignant neoplasm of brain: Secondary | ICD-10-CM | POA: Diagnosis not present

## 2023-07-07 DIAGNOSIS — Z96651 Presence of right artificial knee joint: Secondary | ICD-10-CM | POA: Diagnosis not present

## 2023-07-07 DIAGNOSIS — Z79899 Other long term (current) drug therapy: Secondary | ICD-10-CM | POA: Diagnosis not present

## 2023-07-21 DIAGNOSIS — C3491 Malignant neoplasm of unspecified part of right bronchus or lung: Secondary | ICD-10-CM | POA: Diagnosis not present

## 2023-07-30 ENCOUNTER — Other Ambulatory Visit: Payer: Self-pay

## 2023-08-03 DIAGNOSIS — C7800 Secondary malignant neoplasm of unspecified lung: Secondary | ICD-10-CM | POA: Diagnosis not present

## 2023-08-03 DIAGNOSIS — C7951 Secondary malignant neoplasm of bone: Secondary | ICD-10-CM | POA: Diagnosis not present

## 2023-08-03 DIAGNOSIS — J90 Pleural effusion, not elsewhere classified: Secondary | ICD-10-CM | POA: Diagnosis not present

## 2023-08-03 DIAGNOSIS — Z006 Encounter for examination for normal comparison and control in clinical research program: Secondary | ICD-10-CM | POA: Diagnosis not present

## 2023-08-03 DIAGNOSIS — C787 Secondary malignant neoplasm of liver and intrahepatic bile duct: Secondary | ICD-10-CM | POA: Diagnosis not present

## 2023-08-03 DIAGNOSIS — J948 Other specified pleural conditions: Secondary | ICD-10-CM | POA: Diagnosis not present

## 2023-08-03 DIAGNOSIS — C3411 Malignant neoplasm of upper lobe, right bronchus or lung: Secondary | ICD-10-CM | POA: Diagnosis not present

## 2023-08-04 DIAGNOSIS — C3491 Malignant neoplasm of unspecified part of right bronchus or lung: Secondary | ICD-10-CM | POA: Diagnosis not present

## 2023-08-04 DIAGNOSIS — Z006 Encounter for examination for normal comparison and control in clinical research program: Secondary | ICD-10-CM | POA: Diagnosis not present

## 2023-08-06 DIAGNOSIS — Z006 Encounter for examination for normal comparison and control in clinical research program: Secondary | ICD-10-CM | POA: Diagnosis not present

## 2023-08-06 DIAGNOSIS — C3411 Malignant neoplasm of upper lobe, right bronchus or lung: Secondary | ICD-10-CM | POA: Diagnosis not present

## 2023-08-06 DIAGNOSIS — C7931 Secondary malignant neoplasm of brain: Secondary | ICD-10-CM | POA: Diagnosis not present

## 2023-08-06 DIAGNOSIS — Z923 Personal history of irradiation: Secondary | ICD-10-CM | POA: Diagnosis not present

## 2023-08-16 DIAGNOSIS — H6123 Impacted cerumen, bilateral: Secondary | ICD-10-CM | POA: Diagnosis not present

## 2023-08-16 DIAGNOSIS — C787 Secondary malignant neoplasm of liver and intrahepatic bile duct: Secondary | ICD-10-CM | POA: Diagnosis not present

## 2023-08-16 DIAGNOSIS — Z923 Personal history of irradiation: Secondary | ICD-10-CM | POA: Diagnosis not present

## 2023-08-16 DIAGNOSIS — H6590 Unspecified nonsuppurative otitis media, unspecified ear: Secondary | ICD-10-CM | POA: Diagnosis not present

## 2023-08-16 DIAGNOSIS — C771 Secondary and unspecified malignant neoplasm of intrathoracic lymph nodes: Secondary | ICD-10-CM | POA: Diagnosis not present

## 2023-08-16 DIAGNOSIS — C3411 Malignant neoplasm of upper lobe, right bronchus or lung: Secondary | ICD-10-CM | POA: Diagnosis not present

## 2023-08-16 DIAGNOSIS — C7931 Secondary malignant neoplasm of brain: Secondary | ICD-10-CM | POA: Diagnosis not present

## 2023-08-16 DIAGNOSIS — H90A31 Mixed conductive and sensorineural hearing loss, unilateral, right ear with restricted hearing on the contralateral side: Secondary | ICD-10-CM | POA: Diagnosis not present

## 2023-08-16 DIAGNOSIS — H9042 Sensorineural hearing loss, unilateral, left ear, with unrestricted hearing on the contralateral side: Secondary | ICD-10-CM | POA: Diagnosis not present

## 2023-08-16 DIAGNOSIS — C3491 Malignant neoplasm of unspecified part of right bronchus or lung: Secondary | ICD-10-CM | POA: Diagnosis not present

## 2023-08-16 DIAGNOSIS — Z7969 Long term (current) use of other immunomodulators and immunosuppressants: Secondary | ICD-10-CM | POA: Diagnosis not present

## 2023-08-16 DIAGNOSIS — R413 Other amnesia: Secondary | ICD-10-CM | POA: Diagnosis not present

## 2023-08-16 DIAGNOSIS — Z79899 Other long term (current) drug therapy: Secondary | ICD-10-CM | POA: Diagnosis not present

## 2023-08-16 DIAGNOSIS — H9071 Mixed conductive and sensorineural hearing loss, unilateral, right ear, with unrestricted hearing on the contralateral side: Secondary | ICD-10-CM | POA: Diagnosis not present

## 2023-08-16 DIAGNOSIS — C7951 Secondary malignant neoplasm of bone: Secondary | ICD-10-CM | POA: Diagnosis not present

## 2023-08-16 DIAGNOSIS — Z7952 Long term (current) use of systemic steroids: Secondary | ICD-10-CM | POA: Diagnosis not present

## 2023-08-16 DIAGNOSIS — H90A22 Sensorineural hearing loss, unilateral, left ear, with restricted hearing on the contralateral side: Secondary | ICD-10-CM | POA: Diagnosis not present

## 2023-08-23 DIAGNOSIS — R4189 Other symptoms and signs involving cognitive functions and awareness: Secondary | ICD-10-CM | POA: Diagnosis not present

## 2023-08-23 DIAGNOSIS — C3491 Malignant neoplasm of unspecified part of right bronchus or lung: Secondary | ICD-10-CM | POA: Diagnosis not present

## 2023-09-01 DIAGNOSIS — C3491 Malignant neoplasm of unspecified part of right bronchus or lung: Secondary | ICD-10-CM | POA: Diagnosis not present

## 2023-09-08 DIAGNOSIS — C3491 Malignant neoplasm of unspecified part of right bronchus or lung: Secondary | ICD-10-CM | POA: Diagnosis not present

## 2023-09-17 DIAGNOSIS — C3491 Malignant neoplasm of unspecified part of right bronchus or lung: Secondary | ICD-10-CM | POA: Diagnosis not present

## 2023-09-17 DIAGNOSIS — R748 Abnormal levels of other serum enzymes: Secondary | ICD-10-CM | POA: Diagnosis not present

## 2023-10-05 DIAGNOSIS — C782 Secondary malignant neoplasm of pleura: Secondary | ICD-10-CM | POA: Diagnosis not present

## 2023-10-05 DIAGNOSIS — Z79899 Other long term (current) drug therapy: Secondary | ICD-10-CM | POA: Diagnosis not present

## 2023-10-05 DIAGNOSIS — C787 Secondary malignant neoplasm of liver and intrahepatic bile duct: Secondary | ICD-10-CM | POA: Diagnosis not present

## 2023-10-05 DIAGNOSIS — C7951 Secondary malignant neoplasm of bone: Secondary | ICD-10-CM | POA: Diagnosis not present

## 2023-10-05 DIAGNOSIS — C3411 Malignant neoplasm of upper lobe, right bronchus or lung: Secondary | ICD-10-CM | POA: Diagnosis not present

## 2023-10-05 DIAGNOSIS — Z8673 Personal history of transient ischemic attack (TIA), and cerebral infarction without residual deficits: Secondary | ICD-10-CM | POA: Diagnosis not present

## 2023-10-05 DIAGNOSIS — C772 Secondary and unspecified malignant neoplasm of intra-abdominal lymph nodes: Secondary | ICD-10-CM | POA: Diagnosis not present

## 2023-10-05 DIAGNOSIS — C7802 Secondary malignant neoplasm of left lung: Secondary | ICD-10-CM | POA: Diagnosis not present

## 2023-10-21 DIAGNOSIS — M6281 Muscle weakness (generalized): Secondary | ICD-10-CM | POA: Diagnosis not present

## 2023-10-21 DIAGNOSIS — R2681 Unsteadiness on feet: Secondary | ICD-10-CM | POA: Diagnosis not present

## 2023-10-22 DIAGNOSIS — R531 Weakness: Secondary | ICD-10-CM | POA: Diagnosis not present

## 2023-10-22 DIAGNOSIS — J40 Bronchitis, not specified as acute or chronic: Secondary | ICD-10-CM | POA: Diagnosis not present

## 2023-10-22 DIAGNOSIS — C3490 Malignant neoplasm of unspecified part of unspecified bronchus or lung: Secondary | ICD-10-CM | POA: Diagnosis not present

## 2023-10-22 NOTE — Progress Notes (Signed)
 Chief Complaint  Patient presents with  . coughing  . congestion  . Mucus    X10 days     Patient is agreeable to [employer] scribe.   History of Present Illness Kurt Ewing is a 68 year old male with lung cancer who presents with persistent cough and congestion.  He has been experiencing a persistent cough and congestion for about three weeks to a month. The cough is productive with yellow and clear phlegm, primarily in the morning, and causes sleep disturbances despite elevating his pillow. He experiences coughing fits and muscle soreness from coughing. No hemoptysis is present.  He has a history of lung cancer diagnosed in January 2024, with the cancer located in the upper right lobe. He underwent chemotherapy and is currently participating in a clinical trial at [facility]. The trial drug has caused elevated liver enzymes. Recent imaging showed stable lesions, some new, and some that had grown. He has lesions in his ribcage and liver.  He recently had bronchitis, which took about a month to improve. He is currently on a trial drug and takes tramadol for pain management. He experiences nausea and vomiting depending on the amount of phlegm and sometimes gags on it. In the review of symptoms, he reports nasal drainage but no sore throat.    ROS  Review of systems is unremarkable for any active cardiac, respiratory, GI, GU, hematologic, neurologic, dermatologic, HEENT, or psychiatric symptoms except as noted above.  No fevers, chills, or constitutional symptoms.   Current Outpatient Medications  Medication Sig Dispense Refill  . famotidine (PEPCID) 40 MG tablet TAKE 1 TABLET BY MOUTH AT BEDTIME 90 tablet 1  . folic acid  (FOLVITE ) 1 MG tablet Take 1 mg by mouth once daily    . lisinopriL  (ZESTRIL ) 5 MG tablet TAKE 1 TABLET BY MOUTH ONCE DAILY. 90 tablet 3  . melatonin 5 mg Tab Take by mouth    . prochlorperazine  (COMPAZINE ) 10 MG tablet Take by mouth    . amoxicillin-clavulanate  (AUGMENTIN) 875-125 mg tablet Take 1 tablet (875 mg total) by mouth every 12 (twelve) hours for 7 days 14 tablet 0  . benzonatate (TESSALON) 200 MG capsule Take 1 capsule (200 mg total) by mouth 3 (three) times daily as needed for Cough for up to 7 days 20 capsule 0   No current facility-administered medications for this visit.    Allergies as of 10/22/2023  . (No Known Allergies)    Patient Active Problem List  Diagnosis  . HTN (hypertension)  . Hyperlipidemia, mixed  . Family history of coronary artery disease  . Medicare annual wellness visit, initial  . Laryngopharyngeal reflux (LPR)  . Metastatic lung carcinoma, right (CMS/HHS-HCC)  . Metastasis to brain (CMS/HHS-HCC)  . Malignant pleural effusion (CMS-HCC)  . Malignant neoplasm of unspecified part of right bronchus or lung (CMS/HHS-HCC)  . Metastatic non-small cell lung cancer (CMS/HHS-HCC)  . Port-A-Cath in place  . Thrombocytopenia ()    Past Medical History:  Diagnosis Date  . History of pneumonia 03/2022  . HTN (hypertension) 09/26/2013  . Lung cancer (CMS/HHS-HCC) 04/2022    Past Surgical History:  Procedure Laterality Date  . LEFT KNEE ARTHROSCOPY  04/27/2004  . BRONCHOSCOPY    . TONSILLECTOMY      Vitals:   10/22/23 1444  BP: (!) 140/82  Pulse: 66  Temp: 37 C (98.6 F)  SpO2: 97%  Weight: 76.3 kg (168 lb 3.2 oz)  PainSc: 0-No pain   Body mass  index is 24.13 kg/m.  Exam BP (!) 140/82   Pulse 66   Temp 37 C (98.6 F)   Wt 76.3 kg (168 lb 3.2 oz)   SpO2 97%   BMI 24.13 kg/m   General. Well appearing; NAD; VS reviewed     Eyes. Sclera and conjunctiva clear; Vision grossly intact; extraocular movements intact Oropharynx. No suspicious lesions Neck. Supple. No swelling, masses, thyroid  normal size, no masses palpated.   Lungs. Respirations unlabored; clear to auscultation bilaterally Cardiovascular. Heart regular rate and rhythm without murmurs, gallops, or rubs Extremities: without edema and  with 2+ pulses bilaterally Skin. Normal color and turgor Neurologic. Alert and oriented x3; CN 2-12 grossly intact; no focal deficits  Assessment & Plan  Bronchitis Suspected bacterial bronchitis due to immunocompromised state. Empirical antibiotic treatment initiated to prevent pneumonia. Discussed viral bronchitis but opted for bacterial coverage due to presentation and history. - Prescribe amoxicillin-clavulanate (Augmentin) twice daily for 7 days. - Prescribe Tessalon Perles for cough management. - Advise to monitor symptoms and report if no improvement by early next week.  Lung Cancer Lung cancer diagnosed January 2024, involving one lobe of the right lung. Participating in a clinical trial at [facility], showing efficacy in reducing lesions but causing elevated liver enzymes. Recent imaging showed mixed lesion response, likely due to temporary cessation of the trial drug. Resumed trial drug. Current cough not related to cancer progression. Discussed trial drug's efficacy and potential liver function impact. No significant concerns about drug interactions with antibiotics. - Continue clinical trial participation at [facility]. - Monitor liver enzyme levels due to trial drug effects.  Physical Therapy Referral Undergoing physical therapy for strength and gait improvement post-stroke. Current provider is out of network for insurance. - Write a new referral for in-network physical therapy. - Consider local options such as [facility] or [facility].     F/U: Patient follow-up as needed.  JASON HESTLE WHITAKER, PA  This note has been created using automated tools and reviewed for accuracy by JASON HESTLE WHITAKER.   Note: This dictation was prepared with Dragon dictation along with smaller phrase technology. Any transcriptional errors that result from this process are unintentional.

## 2023-11-03 DIAGNOSIS — C771 Secondary and unspecified malignant neoplasm of intrathoracic lymph nodes: Secondary | ICD-10-CM | POA: Diagnosis not present

## 2023-11-03 DIAGNOSIS — C782 Secondary malignant neoplasm of pleura: Secondary | ICD-10-CM | POA: Diagnosis not present

## 2023-11-03 DIAGNOSIS — C7931 Secondary malignant neoplasm of brain: Secondary | ICD-10-CM | POA: Diagnosis not present

## 2023-11-03 DIAGNOSIS — Z79899 Other long term (current) drug therapy: Secondary | ICD-10-CM | POA: Diagnosis not present

## 2023-11-03 DIAGNOSIS — I6381 Other cerebral infarction due to occlusion or stenosis of small artery: Secondary | ICD-10-CM | POA: Diagnosis not present

## 2023-11-03 DIAGNOSIS — C3411 Malignant neoplasm of upper lobe, right bronchus or lung: Secondary | ICD-10-CM | POA: Diagnosis not present

## 2023-11-03 DIAGNOSIS — I10 Essential (primary) hypertension: Secondary | ICD-10-CM | POA: Diagnosis not present

## 2023-11-03 NOTE — Progress Notes (Signed)
 Thoracic Oncology Follow Up Note  Patient: Kurt Ewing Age: 68 y.o.  MRN: [id number] Attending: Darletta Schwalbe, MD    Patient with [phone] dx Oncology History  Adenocarcinoma of right lung  05/27/2022 - 06/15/2022 TX-Radiation Therapy     Whole brain radiation   05/2022 Initial Diagnosis   04/30/22 - CT Chest Perihilar right upper lobe masslike opacity measuring 3.4 x 5.6 x 3.8 cm, suspicious for primary bronchogenic malignancy.  This abuts the major fissure.  Differential consideration includes masslike pneumonia. Moderate-sized right pleural effusion which is partially loculated tracks anteriorly the lower hemithorax.  There may be areas of occasional pleural enhancement and thickening posteriorly. Rounded masslike opacity in the dependent right lower lobe measuring 5.8 x 4.1 cm.  This is nonspecific and may represent round atelectasis due to overlying pleural effusion, although the possibility of additional site of neoplasm or infection is also raised Additional bilateral pulmonary nodules, largest measuring 7 mm in the right upper lobe Prominent mediastinal and right hilar lymph nodes. Low-density 2.4 cm right adrenal nodule.  This is unchanged from 12/15/2019 abdominal CT in typical of adenoma.  05/05/22 - Right Thoracentesis Suspicious for malignancy Background lymphocyte predominant effusion  05/13/22- PET CT 5.9 x 3.5 cm right hilar mass with maximum SUV 5.7 compatible with malignancy.  The epicenter of this mass appears to be in the right upper lobe.  Faintly increased activity in the right hilar lymph node which could be involved. Large right pleural effusion with pleural thickening and subtle accentuated activity along the pleural surface without a well-defined masslike pleural lesion. Low-grade activity in the collapsed right lower lobe, maximum SUV 3.1, probably inflammatory. No findings of malignancy outside of the chest. 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 Lumbar spondylosis and degenerative disc disease Aortic atherosclerosis  05/21/22 - MRI Brain Multiple peripherally enhancing lesions in the bilateral cerebral hemispheres, concerning for metastatic disease.  05/29/22 - Robotic Assisted Navigational Bronch w/ EBUS RUL Biopsy - Non-small cell carcinoma, favor adenocarcinoma 11R - suspicious for malignancy. Single atypical epithelial group in a hypocellular specimen Station 7, 10R,  - positive for malignancy   06/22/2022 -  TX-Chemotherapy/Biotherapy/Investigational/SMI    Carboplatin /pemetrexed /pembrolizumab    06/22/2022 - 12/23/2022 TX-Chemotherapy/Biotherapy/Investigational/SMI (Autogenerated)    Treatment Plan   TH OP: CARBOplatin , PEMEtrexed , Pembrolizumab  Every 3 Weeks   Chemotherapy summary: pembrolizumab  (KEYTRUDA ) 200 mg in sodium chloride  0.9% (NS) 50 mL IVPB, intravenous, 8 of 11 cycles CARBOplatin  (PARAPLATIN ) in dextrose  5% (D5W) IVPB, intravenous, 4 of 4 cycles PEMEtrexed  (ALIMTA ) 1,000 mg in sodium chloride  0.9% (NS) 100 mL IVPB, intravenous, 8 of 11 cycles   Treatment Dates: 06/22/2022 to 12/23/2022  Line of Treatment: Solid: Metastatic, Front Line Therapy   Treatment Goal: Life Prolongation   Discontinue Reason: More Current Plan Available    06/10/2023 -  TX-Chemotherapy/Biotherapy/Investigational/SMI (Autogenerated)    Treatment Plan   [phone] NVL-330   Chemotherapy summary: [No matching medication found in this treatment plan]   Treatment Dates: 06/10/2023 to 09/05/2025 (Planned)  Line of Treatment: Solid: Metastatic, Second Line and Subsequent   Treatment Goal: Life Prolongation   Discontinue Reason: [name] is still active]      Subjective Kurt Ewing is a 68 y.o. 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    He has ongoing decreased appetite,  early satiety and weight loss, otherwise no new symptoms.  ECOG-PS is 1   Review of Systems: A complete 14-point review of systems was performed and was negative except for as noted above.   Objective  Physical Exam:  BP 102/67   Pulse 84   Temp 36.8 C (98.2 F) (Oral)   Resp 14   Wt 76.2 kg (167 lb 15.9 oz)   SpO2 97%   BMI 24.10 kg/m     ECOG PS: 1  General:  AAOx3, NAD, well-appearing.   HEENT: [place]/AT, pupils equal, no scleral icterus Neck:  No JVD, no LNs. Chest: CTA, no r/r/r.   CVS:  RRR, S1S2 N. No m/r/g Abdomen:  Soft, NT, ND, BS+, no organomegaly   Skin:  No rashes, bruises or skin lesions. Extremities: no cyanosis, clubbing, edema    Neuro:  no focal deficits, sensation and strength grossly intact  No Known Allergies  Current Medications   Medication Sig Start Date End Date Taking? Authorizing Provider  acetaminophen  (TYLENOL ) 500 mg tablet Take 1 tablet (500 mg) by mouth.   Yes HISTORICAL PROVIDER  folic acid  (FOLVITE ) 1 mg tablet Take 1 tablet (1 mg) by mouth. 06/15/22  Yes HISTORICAL PROVIDER  INV-(2024-0410) NVL-330 ([id number], [id number]) 150 mg tablet Take 1 tablet (150 mg) by mouth twice daily on empty stomach, at least 1 hour before or 2 hours after a meal/beverages, other than water. Take with 8 ounces of water. Take tablet as a whole, do not crush, chew, or dissolve tablet. Total each dose 200mg . 10/06/23  Yes Altan, Mehmet, MD  INV-(2024-0410) NVL-330 ([id number], [id number]) 25 mg tablet Take 2 tablets (50 mg) by mouth twice daily.  Take on an empty stomach, at least 1 hour before or 2 hours after a meal or beverages other than water. Take with 8 ounces of water. Take tables whole, do not crush, chew, or dissolve. Total dose 200 mg 1 (150mg  tablet) and 2 (25 mg) tablets. 10/06/23  Yes Altan, Mehmet, MD  loperamide (IMODIUM) 2 mg capsule Take 1-2 capsules (2-4 mg) by mouth as needed for diarrhea.  Take 2 capsules (4 mg) by  mouth with first loose stool, then 1 capsule (2 mg) every 2 hours until diarrhea free for 12 hours. May take 2 capsules (4 mg) every 4 hours at night. 06/10/23  Yes Zhang, Jianjun J., MD  melatonin 3 mg tablet Take by mouth.   Yes HISTORICAL PROVIDER  ondansetron  (ZOFRAN ) 8 mg tablet Take 1 tablet (8 mg) by mouth every 8 (eight) hours as needed for nausea. 06/10/23  Yes Zhang, Jianjun J., MD  traMADol (Ultram) 50 mg tablet Take 1 tablet (50 mg) by mouth every 6 (six) hours as needed for moderate pain. 10/06/23  Yes Altan, Mehmet, MD  amLODIPine (Norvasc) 5 mg tablet Take 1 tablet (5 mg) by mouth daily. Patient not taking: Reported on 08/16/2023 07/07/23   Araujo, Haniel A, MD    Past Medical History:  Diagnosis Date  . Cancer   . Essential (primary) hypertension     Past Surgical History:  Procedure Laterality Date  . KNEE ARTHROPLASTY Right   . PR INSERTION INDWELLING TUNNELED PLEURAL CATHETER Right 07/07/2022   Procedure: INSERTION OF INDWELLING TUNNELED PLEURAL CATHETER WITH CUFF-RIGHT;  Surgeon: Alveria Fines, MD;  Location: MAIN PULM PROC;  Service: PULMONARY  . PR THORACOSCOPY WITH BIOPSYIES OF PLEURA Right 07/07/2022   Procedure: THORACOSCOPY WITH BIOPSY(IES) OF PLEURA;  Surgeon: Alveria Fines, MD;  Location: MAIN PULM PROC;  Service:  PULMONARY    Family History  Problem Relation Name Age of Onset  . Melanoma Paternal Grandmother      Social History   Socioeconomic History  . Marital status: Married  Tobacco Use  . Smoking status: Never  . Smokeless tobacco: Never  Vaping Use  . Vaping status: Never Used    Labs: CBC, Coags, BMP, Mg, Phos  Recent Labs    Units 11/03/23 0837  White Blood Cell K/uL 8.8  Hemoglobin g/dL 86.3  Hematocrit % 58.5  Platelet K/uL 240  International Normalization Ratio  1.09  Sodium Level mmol/L 138  Potassium Level mmol/L 4.1  Chloride mmol/L  102  CO2 mmol/L  27  BUN mg/dL 14  Creatinine mg/dL 8.79*  Glucose Level mg/dL 94   Calcium  Level Total mg/dL 9.0  Magnesium Level mg/dL 2.0  Phosphorus Level mg/dL 2.7   Liver Function, Amylase, & Lipase  Recent Labs    Units 11/03/23 0837  Tot Protein gm/dL 6.8  Albumin Level gm/dL 3.6  ALT U/L 46*  AST U/L 47*  Alkaline Phosphatase U/L 420*  Bilirubin Total mg/dL 1.7*  1.7*   Cardiac Enzymes  Recent Labs    Units 11/03/23 0837  Creatine Kinase U/L 65    Imaging: No results found.   I have reviewed pertinent laboratory results, pertinent images, and pertinent pathology data.   Assessment & Plan Kurt Ewing  is a 68 y.o. male who presents with adenocarcinoma of the right upper lobe metastatic to intrathoracic nodes, brain and pleural fluid. His tumor has p.[id number]  (Her 2 insertion mutation) Completed WBRT 06/15/22. Recent progression on pem/pembro maintenance after initial response to carbo/pem/pembro and opted to proceed with a clinical trial with NVL-330, a novel brain-penetrant HER2-selective tyrosine kinase inhibitor.   Stage IV lung adenocarcinoma with [id number]  HER2 exon 20 insertion - Currently on 2024-0410 NVL-330 study protocol  - He had therapy break due to LFT increase during the treatment break he had radiologic response to his target lesions but found to have new liver disease, his LFT increase was in the setting of increased iboprofen use, once the LFTs improved he restarted the therapy with20% dose reduction to 200 mg bid, he has been feeling well, has stable G1 transaminitis with slight improvement in T/D bil levels. He has a pending hepatology apppointment which he deferred, I reviewed the importance of establishing care with hepatology and we will request a rescheduling of this visit.  He has ongoing decreased appetite, early satiety and weight loss, otherwise no new symptoms. I recommended a nutrition consult.  We will monitor his symptoms in 2 weeks when he return back to a clinic visit with hepatology.    Hypertension/hypercalemia - unlikely related to study drug He has chronic hypertension taking lisinopril , he is closely monitoring and he is compliant to his meds and diet with now good BP control, he will continue to monitor  Incidental Lacunar Infarct - Some balance symptoms, though unclear if this is related to CVA - Will request evaluation by neurology

## 2023-11-13 ENCOUNTER — Other Ambulatory Visit: Payer: Self-pay

## 2023-11-17 DIAGNOSIS — I6381 Other cerebral infarction due to occlusion or stenosis of small artery: Secondary | ICD-10-CM | POA: Diagnosis not present

## 2023-11-17 DIAGNOSIS — R6881 Early satiety: Secondary | ICD-10-CM | POA: Diagnosis not present

## 2023-11-17 DIAGNOSIS — C3411 Malignant neoplasm of upper lobe, right bronchus or lung: Secondary | ICD-10-CM | POA: Diagnosis not present

## 2023-11-17 DIAGNOSIS — I10 Essential (primary) hypertension: Secondary | ICD-10-CM | POA: Diagnosis not present

## 2023-11-17 DIAGNOSIS — C7931 Secondary malignant neoplasm of brain: Secondary | ICD-10-CM | POA: Diagnosis not present

## 2023-11-17 DIAGNOSIS — C3491 Malignant neoplasm of unspecified part of right bronchus or lung: Secondary | ICD-10-CM | POA: Diagnosis not present

## 2023-11-17 DIAGNOSIS — K7689 Other specified diseases of liver: Secondary | ICD-10-CM | POA: Diagnosis not present

## 2023-11-17 DIAGNOSIS — R7989 Other specified abnormal findings of blood chemistry: Secondary | ICD-10-CM | POA: Diagnosis not present

## 2023-11-17 DIAGNOSIS — C782 Secondary malignant neoplasm of pleura: Secondary | ICD-10-CM | POA: Diagnosis not present

## 2023-11-17 DIAGNOSIS — E46 Unspecified protein-calorie malnutrition: Secondary | ICD-10-CM | POA: Diagnosis not present

## 2023-11-17 DIAGNOSIS — R7401 Elevation of levels of liver transaminase levels: Secondary | ICD-10-CM | POA: Diagnosis not present

## 2023-11-17 DIAGNOSIS — Z808 Family history of malignant neoplasm of other organs or systems: Secondary | ICD-10-CM | POA: Diagnosis not present

## 2023-11-17 DIAGNOSIS — C771 Secondary and unspecified malignant neoplasm of intrathoracic lymph nodes: Secondary | ICD-10-CM | POA: Diagnosis not present

## 2023-11-17 DIAGNOSIS — Z79899 Other long term (current) drug therapy: Secondary | ICD-10-CM | POA: Diagnosis not present

## 2023-11-18 DIAGNOSIS — C7931 Secondary malignant neoplasm of brain: Secondary | ICD-10-CM | POA: Diagnosis not present

## 2023-11-18 DIAGNOSIS — C7951 Secondary malignant neoplasm of bone: Secondary | ICD-10-CM | POA: Diagnosis not present

## 2023-11-18 DIAGNOSIS — C787 Secondary malignant neoplasm of liver and intrahepatic bile duct: Secondary | ICD-10-CM | POA: Diagnosis not present

## 2023-11-18 DIAGNOSIS — C7800 Secondary malignant neoplasm of unspecified lung: Secondary | ICD-10-CM | POA: Diagnosis not present

## 2023-11-18 DIAGNOSIS — Z923 Personal history of irradiation: Secondary | ICD-10-CM | POA: Diagnosis not present

## 2023-11-18 DIAGNOSIS — R748 Abnormal levels of other serum enzymes: Secondary | ICD-10-CM | POA: Diagnosis not present

## 2023-11-18 DIAGNOSIS — C3491 Malignant neoplasm of unspecified part of right bronchus or lung: Secondary | ICD-10-CM | POA: Diagnosis not present

## 2023-11-18 DIAGNOSIS — Z79899 Other long term (current) drug therapy: Secondary | ICD-10-CM | POA: Diagnosis not present

## 2023-12-21 ENCOUNTER — Ambulatory Visit: Attending: Family Medicine | Admitting: Physical Therapy

## 2023-12-23 ENCOUNTER — Ambulatory Visit: Admitting: Physical Therapy

## 2023-12-28 ENCOUNTER — Ambulatory Visit: Admitting: Physical Therapy

## 2023-12-28 DIAGNOSIS — C3490 Malignant neoplasm of unspecified part of unspecified bronchus or lung: Secondary | ICD-10-CM | POA: Diagnosis not present

## 2023-12-28 DIAGNOSIS — E860 Dehydration: Secondary | ICD-10-CM | POA: Diagnosis not present

## 2023-12-28 DIAGNOSIS — J910 Malignant pleural effusion: Secondary | ICD-10-CM | POA: Diagnosis not present

## 2023-12-28 DIAGNOSIS — E440 Moderate protein-calorie malnutrition: Secondary | ICD-10-CM | POA: Diagnosis not present

## 2023-12-30 ENCOUNTER — Ambulatory Visit: Admitting: Physical Therapy

## 2024-01-04 ENCOUNTER — Ambulatory Visit: Admitting: Physical Therapy

## 2024-01-05 DIAGNOSIS — E782 Mixed hyperlipidemia: Secondary | ICD-10-CM | POA: Diagnosis not present

## 2024-01-05 DIAGNOSIS — G9389 Other specified disorders of brain: Secondary | ICD-10-CM | POA: Diagnosis not present

## 2024-01-05 DIAGNOSIS — I10 Essential (primary) hypertension: Secondary | ICD-10-CM | POA: Diagnosis not present

## 2024-01-05 DIAGNOSIS — C7801 Secondary malignant neoplasm of right lung: Secondary | ICD-10-CM | POA: Diagnosis not present

## 2024-01-05 DIAGNOSIS — I639 Cerebral infarction, unspecified: Secondary | ICD-10-CM | POA: Diagnosis not present

## 2024-01-05 DIAGNOSIS — R634 Abnormal weight loss: Secondary | ICD-10-CM | POA: Diagnosis not present

## 2024-01-05 DIAGNOSIS — J90 Pleural effusion, not elsewhere classified: Secondary | ICD-10-CM | POA: Diagnosis not present

## 2024-01-05 DIAGNOSIS — G9340 Encephalopathy, unspecified: Secondary | ICD-10-CM | POA: Diagnosis not present

## 2024-01-05 DIAGNOSIS — R627 Adult failure to thrive: Secondary | ICD-10-CM | POA: Diagnosis not present

## 2024-01-05 DIAGNOSIS — R918 Other nonspecific abnormal finding of lung field: Secondary | ICD-10-CM | POA: Diagnosis not present

## 2024-01-05 DIAGNOSIS — R4182 Altered mental status, unspecified: Secondary | ICD-10-CM | POA: Diagnosis not present

## 2024-01-05 DIAGNOSIS — R931 Abnormal findings on diagnostic imaging of heart and coronary circulation: Secondary | ICD-10-CM | POA: Diagnosis not present

## 2024-01-05 DIAGNOSIS — C7931 Secondary malignant neoplasm of brain: Secondary | ICD-10-CM | POA: Diagnosis not present

## 2024-01-05 DIAGNOSIS — C3490 Malignant neoplasm of unspecified part of unspecified bronchus or lung: Secondary | ICD-10-CM | POA: Diagnosis not present

## 2024-01-06 ENCOUNTER — Ambulatory Visit: Admitting: Physical Therapy

## 2024-01-06 DIAGNOSIS — G992 Myelopathy in diseases classified elsewhere: Secondary | ICD-10-CM | POA: Diagnosis not present

## 2024-01-06 DIAGNOSIS — E871 Hypo-osmolality and hyponatremia: Secondary | ICD-10-CM | POA: Diagnosis not present

## 2024-01-06 DIAGNOSIS — Z8673 Personal history of transient ischemic attack (TIA), and cerebral infarction without residual deficits: Secondary | ICD-10-CM | POA: Diagnosis not present

## 2024-01-06 DIAGNOSIS — C3491 Malignant neoplasm of unspecified part of right bronchus or lung: Secondary | ICD-10-CM | POA: Diagnosis not present

## 2024-01-06 DIAGNOSIS — Z8701 Personal history of pneumonia (recurrent): Secondary | ICD-10-CM | POA: Diagnosis not present

## 2024-01-06 DIAGNOSIS — C7951 Secondary malignant neoplasm of bone: Secondary | ICD-10-CM | POA: Diagnosis not present

## 2024-01-06 DIAGNOSIS — I10 Essential (primary) hypertension: Secondary | ICD-10-CM | POA: Diagnosis not present

## 2024-01-06 DIAGNOSIS — I160 Hypertensive urgency: Secondary | ICD-10-CM | POA: Diagnosis not present

## 2024-01-06 DIAGNOSIS — G9340 Encephalopathy, unspecified: Secondary | ICD-10-CM | POA: Diagnosis not present

## 2024-01-06 DIAGNOSIS — E8809 Other disorders of plasma-protein metabolism, not elsewhere classified: Secondary | ICD-10-CM | POA: Diagnosis not present

## 2024-01-06 DIAGNOSIS — C7931 Secondary malignant neoplasm of brain: Secondary | ICD-10-CM | POA: Diagnosis not present

## 2024-01-06 DIAGNOSIS — D72829 Elevated white blood cell count, unspecified: Secondary | ICD-10-CM | POA: Diagnosis not present

## 2024-01-06 DIAGNOSIS — Z79899 Other long term (current) drug therapy: Secondary | ICD-10-CM | POA: Diagnosis not present

## 2024-01-06 DIAGNOSIS — C787 Secondary malignant neoplasm of liver and intrahepatic bile duct: Secondary | ICD-10-CM | POA: Diagnosis not present

## 2024-01-06 DIAGNOSIS — Z515 Encounter for palliative care: Secondary | ICD-10-CM | POA: Diagnosis not present

## 2024-01-06 DIAGNOSIS — E782 Mixed hyperlipidemia: Secondary | ICD-10-CM | POA: Diagnosis not present

## 2024-01-06 DIAGNOSIS — R627 Adult failure to thrive: Secondary | ICD-10-CM | POA: Diagnosis not present

## 2024-01-06 DIAGNOSIS — E440 Moderate protein-calorie malnutrition: Secondary | ICD-10-CM | POA: Diagnosis not present

## 2024-01-06 DIAGNOSIS — C7801 Secondary malignant neoplasm of right lung: Secondary | ICD-10-CM | POA: Diagnosis not present

## 2024-01-06 DIAGNOSIS — Z1152 Encounter for screening for COVID-19: Secondary | ICD-10-CM | POA: Diagnosis not present

## 2024-01-06 DIAGNOSIS — C3490 Malignant neoplasm of unspecified part of unspecified bronchus or lung: Secondary | ICD-10-CM | POA: Diagnosis not present

## 2024-01-06 DIAGNOSIS — R4182 Altered mental status, unspecified: Secondary | ICD-10-CM | POA: Diagnosis not present

## 2024-01-06 DIAGNOSIS — M8458XA Pathological fracture in neoplastic disease, other specified site, initial encounter for fracture: Secondary | ICD-10-CM | POA: Diagnosis not present

## 2024-01-06 DIAGNOSIS — Z66 Do not resuscitate: Secondary | ICD-10-CM | POA: Diagnosis not present

## 2024-01-06 DIAGNOSIS — Z7982 Long term (current) use of aspirin: Secondary | ICD-10-CM | POA: Diagnosis not present

## 2024-01-06 DIAGNOSIS — I6349 Cerebral infarction due to embolism of other cerebral artery: Secondary | ICD-10-CM | POA: Diagnosis not present

## 2024-01-06 DIAGNOSIS — N319 Neuromuscular dysfunction of bladder, unspecified: Secondary | ICD-10-CM | POA: Diagnosis not present

## 2024-01-06 DIAGNOSIS — J910 Malignant pleural effusion: Secondary | ICD-10-CM | POA: Diagnosis not present

## 2024-01-06 DIAGNOSIS — Z6823 Body mass index (BMI) 23.0-23.9, adult: Secondary | ICD-10-CM | POA: Diagnosis not present

## 2024-01-07 DIAGNOSIS — I6340 Cerebral infarction due to embolism of unspecified cerebral artery: Secondary | ICD-10-CM | POA: Diagnosis not present

## 2024-01-07 DIAGNOSIS — C7949 Secondary malignant neoplasm of other parts of nervous system: Secondary | ICD-10-CM | POA: Diagnosis not present

## 2024-01-07 DIAGNOSIS — C3490 Malignant neoplasm of unspecified part of unspecified bronchus or lung: Secondary | ICD-10-CM | POA: Diagnosis not present

## 2024-01-07 DIAGNOSIS — M4804 Spinal stenosis, thoracic region: Secondary | ICD-10-CM | POA: Diagnosis not present

## 2024-01-07 DIAGNOSIS — M4854XA Collapsed vertebra, not elsewhere classified, thoracic region, initial encounter for fracture: Secondary | ICD-10-CM | POA: Diagnosis not present

## 2024-01-07 DIAGNOSIS — C7951 Secondary malignant neoplasm of bone: Secondary | ICD-10-CM | POA: Diagnosis not present

## 2024-01-07 DIAGNOSIS — G9340 Encephalopathy, unspecified: Secondary | ICD-10-CM | POA: Diagnosis not present

## 2024-01-07 DIAGNOSIS — R29898 Other symptoms and signs involving the musculoskeletal system: Secondary | ICD-10-CM | POA: Diagnosis not present

## 2024-01-07 DIAGNOSIS — M4317 Spondylolisthesis, lumbosacral region: Secondary | ICD-10-CM | POA: Diagnosis not present

## 2024-01-07 NOTE — Progress Notes (Signed)
 I received a call from [facility] in patient team Nada HERO, APP at [facility]). He remains on therapy break, he got admitted w/altered mental status. I reviewed his baseline and history of brain mets.   The [facility] team will request the most recent imaging studies from [facility] for comparison with his new brain MRI. He will also undergo a spine MRI, and if the diagnosis remains unclear, a lumbar puncture for CSF analysis will be performed. The differential diagnosis includes, but is not limited to: Disease progression with new symptomatic brain metastasis and/or leptomeningeal disease Delayed ICI-related toxicities such as ICI-induced encephalopathy Opportunistic infection, given his history of cancer and prior chemotherapy exposure I shared my contact information with the [facility] team for future communication as needed. If he recovers from this episode and findings are attributed to disease progression, one therapy option would be Zongertinib ([email or web address]), which recently received FDA accelerated approval for the treatment of HER2-mutant NSCLC and can now be considered standard of care.

## 2024-01-08 DIAGNOSIS — I6603 Occlusion and stenosis of bilateral middle cerebral arteries: Secondary | ICD-10-CM | POA: Diagnosis not present

## 2024-01-08 DIAGNOSIS — I6523 Occlusion and stenosis of bilateral carotid arteries: Secondary | ICD-10-CM | POA: Diagnosis not present

## 2024-01-08 DIAGNOSIS — C7931 Secondary malignant neoplasm of brain: Secondary | ICD-10-CM | POA: Diagnosis not present

## 2024-01-08 DIAGNOSIS — C7951 Secondary malignant neoplasm of bone: Secondary | ICD-10-CM | POA: Diagnosis not present

## 2024-01-08 DIAGNOSIS — C7801 Secondary malignant neoplasm of right lung: Secondary | ICD-10-CM | POA: Diagnosis not present

## 2024-01-08 DIAGNOSIS — I6502 Occlusion and stenosis of left vertebral artery: Secondary | ICD-10-CM | POA: Diagnosis not present

## 2024-01-08 DIAGNOSIS — C7802 Secondary malignant neoplasm of left lung: Secondary | ICD-10-CM | POA: Diagnosis not present

## 2024-01-08 DIAGNOSIS — C770 Secondary and unspecified malignant neoplasm of lymph nodes of head, face and neck: Secondary | ICD-10-CM | POA: Diagnosis not present

## 2024-01-08 DIAGNOSIS — G9340 Encephalopathy, unspecified: Secondary | ICD-10-CM | POA: Diagnosis not present

## 2024-01-09 DIAGNOSIS — Z515 Encounter for palliative care: Secondary | ICD-10-CM | POA: Diagnosis not present

## 2024-01-09 DIAGNOSIS — C3490 Malignant neoplasm of unspecified part of unspecified bronchus or lung: Secondary | ICD-10-CM | POA: Diagnosis not present

## 2024-01-09 DIAGNOSIS — Z7189 Other specified counseling: Secondary | ICD-10-CM | POA: Diagnosis not present

## 2024-01-09 DIAGNOSIS — G9520 Unspecified cord compression: Secondary | ICD-10-CM | POA: Diagnosis not present

## 2024-01-11 ENCOUNTER — Ambulatory Visit: Admitting: Physical Therapy

## 2024-01-11 DIAGNOSIS — G9340 Encephalopathy, unspecified: Secondary | ICD-10-CM | POA: Diagnosis not present

## 2024-01-11 DIAGNOSIS — R4182 Altered mental status, unspecified: Secondary | ICD-10-CM | POA: Diagnosis not present

## 2024-01-11 DIAGNOSIS — C3490 Malignant neoplasm of unspecified part of unspecified bronchus or lung: Secondary | ICD-10-CM | POA: Diagnosis not present

## 2024-01-11 DIAGNOSIS — C7931 Secondary malignant neoplasm of brain: Secondary | ICD-10-CM | POA: Diagnosis not present

## 2024-01-11 DIAGNOSIS — I639 Cerebral infarction, unspecified: Secondary | ICD-10-CM | POA: Diagnosis not present

## 2024-01-11 NOTE — Progress Notes (Signed)
 Medical Oncology Daily Progress Note  Patient ID: Kurt Ewing is a 68 y.o. male who is currently Hospital Day: 7 with principal problem of Acute encephalopathy, unspecified  Patient Profile: Kurt Ewing is a 68 y.o. male with a PMHx pertinent for HTN, anemia, prior lacunar infarct, and metastatic NSCLC with metastasis to liver, bone, and brain (receives care at [facility]) s/p prior WBRT and chemotherapy most recently on study drug NVL-330 (06/10/23 - 12/01/23, stopped due to weight loss and failure to thrive) who presented 01/05/24 to the [facility] ER with 2-3 days of confusion.   Subjective:   Interval History: - NAEO - wife at bedside this morning - alert and oriented this morning - patient would like to pursue palliative XRT and then discharge on home hospice.  - reports slightly worsened back pain that started yesterday - tried calling Dr. Earlie again this morning; no answer  Last Bowel Movement Last BM Date: 01/08/24   Objective:    Current Vital Signs 24h Vital Sign Ranges  T 36.7 C (98.1 F) (01/11/24 0818) Temp  Avg: 36.8 C (98.3 F)  Min: 36.7 C (98.1 F)  Max: 36.9 C (98.5 F)  BP 119/65 (01/11/24 0818) BP  Min: 119/65  Max: 128/70  HR 87 (01/11/24 0818) Pulse  Avg: 83.8  Min: 75  Max: 87  RR 18 (01/11/24 0818) Resp  Avg: 16.5  Min: 16  Max: 18  O2sat 96 %   SpO2  Avg: 96 %  Min: 96 %  Max: 96 %       Current Shift Ins/Outs 24h Total Ins/Outs  Time Ins Outs No intake/output data recorded. 09/15 0701 - 09/16 0700 In: -  Out: 1400 [Urine:1400]       Weights   First 74.3 kg (163 lb 12.8 oz) (01/06/24 0721)   Last 74.3 kg (163 lb 12.8 oz) (01/06/24 9278)    Physical Exam: GEN: alert, cooperative, in NAD; Thin older male. Pleasant. HEENT: moist mucous membranes, sclera anicteric, clear conjunctiva Neck: supple, no JVD, no cervical lymphadenopathy LUNGS: CTAB, no wheezes, crackles or rhonchi CV: normal S1/S2, regular rate and rhythm, no murmur/rubs/gallops  noted ABD: soft, NT/ND, normoactive BS; foley in place EXT: warm, well-perfused, no edema SKIN: no rash or lesions appreciated NEURO: alert and oriented to self, location, and overall situation. Speech is clear.  Face symmetric. BUE strength intact/sensation intact. BLE 0/5 strength, sensation loss (thigh/knee).  Scheduled Medications   . amLODIPine  2.5 mg Oral Daily  . aspirin  81 mg Oral Daily  . [START ON 01/12/2024] dexAMETHasone   4 mg Oral Daily  . enoxaparin  40 mg Subcutaneous Daily  . lidocaine   1 patch Transdermal Q24H  . [Held by provider] mirtazapine  7.5 mg Oral QHS  . pantoprazole  40 mg Oral Daily  . polyethylene glycol  17 g Oral Daily  . sennosides  2 tablet Oral BID     Infusions        PRN Meds   acetaminophen  **OR** acetaminophen , baclofen, bisacodyL, melatonin **OR** melatonin, ondansetron  **OR** ondansetron , oxyCODONE  **OR** oxyCODONE , prochlorperazine  **OR** prochlorperazine      Labs: Recent Labs  Lab 01/09/24 0617 01/10/24 0634 01/11/24 0646  NA 135 137 138  K 4.1 4.3 4.3  CO2 25 25 24   BUN 45* 49* 44*  CREATININE 1.1 1.1 0.9  GLUCOSE 116 105 102  CALCIUM  8.6* 8.5* 8.5*  MG 2.3 2.2 2.2   Recent Labs  Lab 01/09/24 0617 01/10/24 0634 01/11/24 9353  ALT 16 20 25   AST 23 25 26   ALKPHOS 385* 423* 475*  TBILI 0.9 1.0 1.3  ALB 2.4* 2.3* 2.2*     Recent Labs  Lab 01/09/24 0617 01/10/24 0634 01/11/24 0646  WBC 15.1* 13.4* 17.6*  HGB 11.1* 10.8* 11.3*  HCT 32.9* 31.8* 33.5*  PLT 217 219 210   Recent Labs  Lab 01/05/24 1951  APTT 28.9  INR 1.1      Lab Results  Component Value Date   CALCIUM  8.5 (L) 01/11/2024   MG 2.2 01/11/2024   PHOS 3.6 01/05/2024   Lab Results  Component Value Date   COLORU Yellow 01/07/2024   CLARITYU Cloudy (!) 01/07/2024   SPECGRAV 1.016 01/07/2024   GLUCOSEU Negative 01/07/2024   KETONESU Negative 01/07/2024   BLOODU Negative 01/07/2024   NITRITE Negative 01/07/2024   LEUKOCYTESUR Negative  01/07/2024   BILIRUBINUR Negative 01/07/2024   UROBILINOGEN 0.2 01/07/2024   RBCUA None Seen 07/30/2021   WBCUA None Seen 07/30/2021   SQUAMEPI None Seen 07/30/2021   BACTERIA None Seen 07/30/2021    Microbiology:  COVID negative 01/05/24  Blood Culture Indication: None taken  Imaging: CTA HEAD AND NECK WITH AND WITHOUT CONTRAST 01/08/24 IMPRESSION: 1.  No intracranial hemorrhage. No early cortical ischemic changes identified. 2.  No intracranial large vessel occlusion.  3.  Multifocal areas of mild to moderate arterial stenosis of the intracranial ICAs and MCAs is favored to be due to intracranial atherosclerotic disease.  4.  Moderate stenosis of the origin of the dominant left vertebral artery. No high-grade arterial stenosis of the bilateral cervical ICAs or common carotid arteries. 5.  Diffuse osseous metastatic disease with severe pathologic compression fracture of the T4 vertebral body and a large destructive right posterior second rib lesion. Findings are more compressibly described on recent MRI total spine. 6.  Brain metastasis better assessed on recent MRI brain. 7.  Partially visualized pulmonary metastasis and right paratracheal nodal metastasis. 8.  Indeterminate right thyroid  nodule.  CT brain non con 01/05/24 IMPRESSION:  1.  No etiology for mental status change identified. 2.  Multiple calcified lesion throughout the brain consistent with history of metastatic lung cancer.   X-ray chest PA and lateral 01/05/24 Impression 1.  Right IJ approach port with tip terminating in the mid SVC. 2.  Enlarged cardiomediastinal contours. 3.  Rounded opacity overlying the right hilum likely reflective of with known right upper lobe lung malignancy. Outside comparison imaging would be useful, if available. 4.  Small right pleural effusion, including a component of right apical pleural fluid versus pleural thickening. Trace left pleural effusion. 5.  Right lower lobe  heterogenous opacity, which could reflect aspiration/infection or atelectasis.  Assessment/Plan:   Kurt Ewing is a 68 y.o. 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 CT brain without acute findings, but  does show evidence of known metastatic disease; no comment on vasogenic edema. No significant electrolyte derangements or other lab abnormalities to explain AMS (ammonia WNL, thyroid  WNL, trops flat, lactate WNL, drug screen clear, procal neg, infectious w/up negative). MRI brain 9/11 w/ punctate focus in the L frontal gyrus indicating acute/subacute infarct (possibly embolic) and brain mets that need to be compared to prior imaging @ [facility] to determine significance. - neuro c/s for new stroke; appreciate recs: Etiology for ischemic burden suspected hypercoagulable iso metastatic cancer versus small vessel disease iso intracranial mild moderate athero of ICA/MCA vessels. CTA head/neck w/o stenosis 9/13. TTE was obtained which did not assess left atrium or aortic valve leaflets for marantic endocarditis or LA thrombus. However, given GOC will defer further workup.   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 Intermittent numbness to BLE, previously was painful at L hip. Difficulty w/ hip flexion on left side/proximal weakness. Urinary retention + bowel incontinence also noted. Spine MRI emergently obtained and showed diffuse spine mets w/ T4 severe cord compression along w/ multiple path fractures in the spine.  - Ortho spine c/s and discussed w/ Dr. Jolaine of [facility] - no acute intervention. - Rad Onc consulted:  - Candidate for palliative RT to T3-T5x. This is primarily for pain control and stopping progression of spinal tumor, there is <20% chance of improving currently neuro deficits. - Simulation on Thursday 9/18 @ 10:30. Likely single fraction on Friday - time TBD - dex 4mg  daily for pain control - PPI in place - foley placed 9/13 AM w/ >1L retained. Will need chronic foley   # Metastatic NSCLC to liver, brain, and bone # Hyperbilirubinemia Has history of metastatic NSCLC with metastasis to liver, bone, and brain who receives care at [facility] with Dr. Mehmet Altan and is s/p prior WBRT and chemotherapy, and most recently on study drug NVL-330  from 06/10/23 - 11/17/23, which was stopped due to weight loss failure to thrive. Not on therapy since that time. Has had ongoing weight loss and low appetite since stopping trial drug in July, suspect 2/2 progressing disease.  - discussed care 9/12 via phone with Dr. Altan  - he noted that patient does have tx option of osimertinib outside of trial now if progression of dz  - cord compression new since conversation; need to discuss w/ Dr. Earlie on development/GOC; called 9/15 without answer, left voicemail to return call. Called again on 9/16 w/o anser - tbili chronically elevated per wife -  patient has decided to proceed with home hospice after completing palliative radiation.   # Hypertensive urgency - [resolved] # HTN Severely hypertensive in the ED with SBP as high as 229. Previously on amlodipine 5 mg and lisinopril  5 mg in the past per wife, but both previously stopped due to improvement in BP. Continued fluctuation of SBP over admission despite scheduled hydralzine q8hr since admission (SBP 100s-210s over 24hrs)  C/f hypertensive encephalopathy vs relation to newly dx CVA above vs endocrine etiology (elevated adrenal function). Of note, does have an adrenal met seen on spineMRI 9/12 - cortisol 9/13 low, however started on dex for cord compression so not a reliable study -  continue amlodipine 2.5mg  daily  - consider relation to stroke being most likely w/ improvement in BP  # Leukocytosis, mild  WBCs 10.8 on ED labs. No recent systemic of focal infectious symptoms noted, though history limited by AMS. UA unremarkable and CXR w/o evidence of PNA. Unclear etiology of leukocytosis, but potentially reactive iso malignancy and further elevated when steroids started. Will hold off on antibiotics given no other evidence of infection and trend CBC while monitor for evidence of infection. New urinary retention 9/12 however UA neg for UTI. - Trend CBC daily   # Failure to thrive # Weight loss # Anorexia # Moderate protein calorie malnutrition He has been diagnosed with moderate protein-calorie malnutrition in the context of chronic illness, based on 10% unintentional weight loss in 6 months, energy intake meeting < 75% of estimated requirement for > or equal to 1 month, mild muscle mass loss.  - Recommend oral nutrition, Recommend trend weight status, Recommend oral nutrition supplements. - consider resumption of mirtazapine  Diet:  Active Orders  Diet   Diet high protein - high calorie   VTE Prophylaxis: low molecular weight heparin   Code Status:DNAR    Discharge Planning:  home hospice after completing palliative XRT  KELSEY GRIMM CETRONE, PA  Medical Oncology Available via pager: [phone]   ------------------------------------------------------------------------------- Attestation signed by Norleen Holter, DO at 01/11/2024  4:35 PM Attestation: I personally saw and evaluated the patient. I reviewed the APP's/Fellow/Resident plan and agree with the findings and plan as documented, additional details will be listed below.   Bow Buntyn is a 68 year old male with metastatic non-small cell lung cancer complicated by brain, liver, and bone metastases presented with acute encephalopathy attributed to a new left frontal embolic stroke in the setting of known metastatic disease and prior whole brain radiation.   He also has severe T4 spinal cord compression causing bilateral lower extremity weakness, neurogenic bladder, and bowel incontinence, currently managed with palliative radiation and steroids for symptom control.   After discussion, patient and family have decided to pursue home hospice care after completing palliative RT.   Joshuva John, DO  -------------------------------------------------------------------------------

## 2024-01-12 ENCOUNTER — Telehealth: Payer: Self-pay | Admitting: *Deleted

## 2024-01-12 DIAGNOSIS — R4182 Altered mental status, unspecified: Secondary | ICD-10-CM | POA: Diagnosis not present

## 2024-01-12 DIAGNOSIS — I639 Cerebral infarction, unspecified: Secondary | ICD-10-CM | POA: Diagnosis not present

## 2024-01-12 DIAGNOSIS — G9340 Encephalopathy, unspecified: Secondary | ICD-10-CM | POA: Diagnosis not present

## 2024-01-12 DIAGNOSIS — C3490 Malignant neoplasm of unspecified part of unspecified bronchus or lung: Secondary | ICD-10-CM | POA: Diagnosis not present

## 2024-01-12 DIAGNOSIS — C7931 Secondary malignant neoplasm of brain: Secondary | ICD-10-CM | POA: Diagnosis not present

## 2024-01-12 NOTE — Telephone Encounter (Signed)
 He is requesting to P[facility] hold brain in January 2024. He also states that if you give her a call then she will give you the papers that says that she can give information for this patient

## 2024-01-12 NOTE — Care Plan (Signed)
  Problem: All patients at High risk for falls Goal: Patient will remain free of falls. Outcome: Progressing   Problem: Gait Alteration Goal: Patient will mobilize safely without falling Outcome: Progressing   Problem: Cognitive/Mental or Behavior Alteration Goal: Patient will mobilize safely without falling Outcome: Progressing   Problem: All patients positive for delirium Goal: Patient will remain safe Outcome: Progressing

## 2024-01-13 ENCOUNTER — Ambulatory Visit: Admitting: Physical Therapy

## 2024-01-13 DIAGNOSIS — C7951 Secondary malignant neoplasm of bone: Secondary | ICD-10-CM | POA: Diagnosis not present

## 2024-01-18 ENCOUNTER — Ambulatory Visit: Admitting: Physical Therapy

## 2024-01-20 ENCOUNTER — Ambulatory Visit: Admitting: Physical Therapy

## 2024-01-25 ENCOUNTER — Ambulatory Visit: Admitting: Physical Therapy

## 2024-01-27 ENCOUNTER — Ambulatory Visit: Admitting: Physical Therapy

## 2024-02-01 ENCOUNTER — Ambulatory Visit: Admitting: Physical Therapy

## 2024-02-03 ENCOUNTER — Ambulatory Visit: Admitting: Physical Therapy

## 2024-02-16 ENCOUNTER — Other Ambulatory Visit: Payer: Self-pay | Admitting: Oncology

## 2024-02-17 ENCOUNTER — Encounter: Payer: Self-pay | Admitting: Oncology

## 2024-02-25 ENCOUNTER — Encounter: Payer: Self-pay | Admitting: Oncology

## 2024-03-27 DEATH — deceased
